# Patient Record
Sex: Female | Born: 1960
Health system: Southern US, Community
[De-identification: ages and names within clinical notes are randomized; demographics above are authoritative.]

## PROBLEM LIST (undated history)

## (undated) DIAGNOSIS — I10 Essential (primary) hypertension: Secondary | ICD-10-CM

## (undated) DIAGNOSIS — E785 Hyperlipidemia, unspecified: Secondary | ICD-10-CM

## (undated) DIAGNOSIS — I499 Cardiac arrhythmia, unspecified: Secondary | ICD-10-CM

## (undated) DIAGNOSIS — M1990 Unspecified osteoarthritis, unspecified site: Secondary | ICD-10-CM

## (undated) DIAGNOSIS — J45909 Unspecified asthma, uncomplicated: Secondary | ICD-10-CM

## (undated) DIAGNOSIS — C801 Malignant (primary) neoplasm, unspecified: Secondary | ICD-10-CM

## (undated) DIAGNOSIS — E119 Type 2 diabetes mellitus without complications: Secondary | ICD-10-CM

## (undated) DIAGNOSIS — Z9289 Personal history of other medical treatment: Secondary | ICD-10-CM

## (undated) HISTORY — DX: Personal history of other medical treatment: Z92.89

## (undated) HISTORY — PX: OTHER SURGICAL HISTORY: SHX169

## (undated) HISTORY — DX: Hyperlipidemia, unspecified: E78.5

## (undated) HISTORY — DX: Type 2 diabetes mellitus without complications: E11.9

## (undated) HISTORY — DX: Essential (primary) hypertension: I10

## (undated) HISTORY — DX: Unspecified asthma, uncomplicated: J45.909

---
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
## 2001-01-01 ENCOUNTER — Ambulatory Visit (HOSPITAL_COMMUNITY): Admission: RE | Admit: 2001-01-01 | Discharge: 2001-01-01 | Payer: Self-pay | Admitting: Family Medicine

## 2001-01-01 ENCOUNTER — Encounter: Payer: Self-pay | Admitting: Family Medicine

## 2001-01-12 ENCOUNTER — Other Ambulatory Visit: Admission: RE | Admit: 2001-01-12 | Discharge: 2001-01-12 | Payer: Self-pay | Admitting: *Deleted

## 2001-03-11 ENCOUNTER — Ambulatory Visit (HOSPITAL_COMMUNITY): Admission: RE | Admit: 2001-03-11 | Discharge: 2001-03-11 | Payer: Self-pay | Admitting: *Deleted

## 2001-03-11 ENCOUNTER — Encounter: Payer: Self-pay | Admitting: *Deleted

## 2001-04-22 ENCOUNTER — Encounter: Payer: Self-pay | Admitting: *Deleted

## 2001-04-22 ENCOUNTER — Ambulatory Visit (HOSPITAL_COMMUNITY): Admission: RE | Admit: 2001-04-22 | Discharge: 2001-04-22 | Payer: Self-pay | Admitting: *Deleted

## 2001-05-06 ENCOUNTER — Ambulatory Visit (HOSPITAL_COMMUNITY): Admission: RE | Admit: 2001-05-06 | Discharge: 2001-05-06 | Payer: Self-pay | Admitting: *Deleted

## 2001-05-10 ENCOUNTER — Ambulatory Visit (HOSPITAL_COMMUNITY): Admission: RE | Admit: 2001-05-10 | Discharge: 2001-05-10 | Payer: Self-pay | Admitting: *Deleted

## 2001-05-13 ENCOUNTER — Ambulatory Visit (HOSPITAL_COMMUNITY): Admission: AD | Admit: 2001-05-13 | Discharge: 2001-05-13 | Payer: Self-pay | Admitting: *Deleted

## 2001-05-17 ENCOUNTER — Ambulatory Visit (HOSPITAL_COMMUNITY): Admission: RE | Admit: 2001-05-17 | Discharge: 2001-05-17 | Payer: Self-pay | Admitting: *Deleted

## 2001-05-20 ENCOUNTER — Ambulatory Visit (HOSPITAL_COMMUNITY): Admission: AD | Admit: 2001-05-20 | Discharge: 2001-05-20 | Payer: Self-pay | Admitting: *Deleted

## 2001-05-24 ENCOUNTER — Ambulatory Visit (HOSPITAL_COMMUNITY): Admission: RE | Admit: 2001-05-24 | Discharge: 2001-05-24 | Payer: Self-pay | Admitting: *Deleted

## 2001-05-27 ENCOUNTER — Ambulatory Visit (HOSPITAL_COMMUNITY): Admission: RE | Admit: 2001-05-27 | Discharge: 2001-05-27 | Payer: Self-pay | Admitting: *Deleted

## 2001-05-31 ENCOUNTER — Ambulatory Visit (HOSPITAL_COMMUNITY): Admission: AD | Admit: 2001-05-31 | Discharge: 2001-05-31 | Payer: Self-pay | Admitting: *Deleted

## 2001-06-03 ENCOUNTER — Ambulatory Visit (HOSPITAL_COMMUNITY): Admission: RE | Admit: 2001-06-03 | Discharge: 2001-06-03 | Payer: Self-pay | Admitting: *Deleted

## 2001-06-03 ENCOUNTER — Encounter: Payer: Self-pay | Admitting: *Deleted

## 2001-06-04 ENCOUNTER — Inpatient Hospital Stay (HOSPITAL_COMMUNITY): Admission: RE | Admit: 2001-06-04 | Discharge: 2001-06-05 | Payer: Self-pay | Admitting: *Deleted

## 2001-07-09 ENCOUNTER — Ambulatory Visit (HOSPITAL_COMMUNITY): Admission: RE | Admit: 2001-07-09 | Discharge: 2001-07-09 | Payer: Self-pay | Admitting: *Deleted

## 2002-01-05 ENCOUNTER — Ambulatory Visit (HOSPITAL_COMMUNITY): Admission: RE | Admit: 2002-01-05 | Discharge: 2002-01-05 | Payer: Self-pay | Admitting: Family Medicine

## 2002-01-05 ENCOUNTER — Encounter: Payer: Self-pay | Admitting: Family Medicine

## 2003-01-17 ENCOUNTER — Encounter: Payer: Self-pay | Admitting: *Deleted

## 2003-01-17 ENCOUNTER — Ambulatory Visit (HOSPITAL_COMMUNITY): Admission: RE | Admit: 2003-01-17 | Discharge: 2003-01-17 | Payer: Self-pay | Admitting: *Deleted

## 2003-04-15 ENCOUNTER — Encounter: Payer: Self-pay | Admitting: Emergency Medicine

## 2003-04-15 ENCOUNTER — Emergency Department (HOSPITAL_COMMUNITY): Admission: EM | Admit: 2003-04-15 | Discharge: 2003-04-15 | Payer: Self-pay | Admitting: Emergency Medicine

## 2003-04-29 ENCOUNTER — Emergency Department (HOSPITAL_COMMUNITY): Admission: EM | Admit: 2003-04-29 | Discharge: 2003-04-29 | Payer: Self-pay | Admitting: Internal Medicine

## 2003-05-02 ENCOUNTER — Emergency Department (HOSPITAL_COMMUNITY): Admission: EM | Admit: 2003-05-02 | Discharge: 2003-05-02 | Payer: Self-pay | Admitting: Emergency Medicine

## 2003-05-12 ENCOUNTER — Emergency Department (HOSPITAL_COMMUNITY): Admission: EM | Admit: 2003-05-12 | Discharge: 2003-05-13 | Payer: Self-pay | Admitting: *Deleted

## 2003-05-15 ENCOUNTER — Emergency Department (HOSPITAL_COMMUNITY): Admission: EM | Admit: 2003-05-15 | Discharge: 2003-05-16 | Payer: Self-pay | Admitting: Emergency Medicine

## 2003-05-16 ENCOUNTER — Ambulatory Visit (HOSPITAL_COMMUNITY): Admission: RE | Admit: 2003-05-16 | Discharge: 2003-05-16 | Payer: Self-pay | Admitting: Family Medicine

## 2003-07-01 HISTORY — PX: CARDIAC CATHETERIZATION: SHX172

## 2003-07-08 ENCOUNTER — Emergency Department (HOSPITAL_COMMUNITY): Admission: EM | Admit: 2003-07-08 | Discharge: 2003-07-08 | Payer: Self-pay | Admitting: Emergency Medicine

## 2003-07-15 ENCOUNTER — Emergency Department (HOSPITAL_COMMUNITY): Admission: EM | Admit: 2003-07-15 | Discharge: 2003-07-16 | Payer: Self-pay | Admitting: Internal Medicine

## 2003-07-21 ENCOUNTER — Inpatient Hospital Stay (HOSPITAL_COMMUNITY): Admission: EM | Admit: 2003-07-21 | Discharge: 2003-07-24 | Payer: Self-pay | Admitting: *Deleted

## 2003-07-21 ENCOUNTER — Encounter: Payer: Self-pay | Admitting: Emergency Medicine

## 2004-02-06 ENCOUNTER — Ambulatory Visit (HOSPITAL_COMMUNITY): Admission: RE | Admit: 2004-02-06 | Discharge: 2004-02-06 | Payer: Self-pay | Admitting: Family Medicine

## 2004-02-21 ENCOUNTER — Ambulatory Visit (HOSPITAL_COMMUNITY): Admission: RE | Admit: 2004-02-21 | Discharge: 2004-02-21 | Payer: Self-pay | Admitting: Family Medicine

## 2005-02-10 ENCOUNTER — Ambulatory Visit (HOSPITAL_COMMUNITY): Admission: RE | Admit: 2005-02-10 | Discharge: 2005-02-10 | Payer: Self-pay | Admitting: Family Medicine

## 2006-02-12 ENCOUNTER — Ambulatory Visit (HOSPITAL_COMMUNITY): Admission: RE | Admit: 2006-02-12 | Discharge: 2006-02-12 | Payer: Self-pay | Admitting: Family Medicine

## 2007-03-05 ENCOUNTER — Ambulatory Visit (HOSPITAL_COMMUNITY): Admission: RE | Admit: 2007-03-05 | Discharge: 2007-03-05 | Payer: Self-pay | Admitting: Family Medicine

## 2007-03-31 HISTORY — PX: TRANSTHORACIC ECHOCARDIOGRAM: SHX275

## 2007-06-21 ENCOUNTER — Other Ambulatory Visit: Admission: RE | Admit: 2007-06-21 | Discharge: 2007-06-21 | Payer: Self-pay | Admitting: Obstetrics and Gynecology

## 2008-03-13 ENCOUNTER — Ambulatory Visit (HOSPITAL_COMMUNITY): Admission: RE | Admit: 2008-03-13 | Discharge: 2008-03-13 | Payer: Self-pay | Admitting: Family Medicine

## 2008-06-21 ENCOUNTER — Other Ambulatory Visit: Admission: RE | Admit: 2008-06-21 | Discharge: 2008-06-21 | Payer: Self-pay | Admitting: Obstetrics and Gynecology

## 2009-02-28 DIAGNOSIS — Z9289 Personal history of other medical treatment: Secondary | ICD-10-CM

## 2009-02-28 HISTORY — DX: Personal history of other medical treatment: Z92.89

## 2009-03-23 ENCOUNTER — Ambulatory Visit (HOSPITAL_COMMUNITY): Admission: RE | Admit: 2009-03-23 | Discharge: 2009-03-23 | Payer: Self-pay | Admitting: Family Medicine

## 2009-08-08 ENCOUNTER — Other Ambulatory Visit: Admission: RE | Admit: 2009-08-08 | Discharge: 2009-08-08 | Payer: Self-pay | Admitting: Obstetrics and Gynecology

## 2009-09-20 ENCOUNTER — Encounter: Payer: Self-pay | Admitting: Internal Medicine

## 2009-09-24 ENCOUNTER — Encounter: Payer: Self-pay | Admitting: Internal Medicine

## 2009-10-08 ENCOUNTER — Ambulatory Visit (HOSPITAL_COMMUNITY): Admission: RE | Admit: 2009-10-08 | Discharge: 2009-10-08 | Payer: Self-pay | Admitting: Internal Medicine

## 2009-10-08 ENCOUNTER — Ambulatory Visit: Payer: Self-pay | Admitting: Internal Medicine

## 2009-10-09 ENCOUNTER — Encounter: Payer: Self-pay | Admitting: Internal Medicine

## 2009-12-06 ENCOUNTER — Ambulatory Visit (HOSPITAL_COMMUNITY)
Admission: RE | Admit: 2009-12-06 | Discharge: 2009-12-06 | Payer: Self-pay | Source: Home / Self Care | Admitting: Family Medicine

## 2010-03-28 ENCOUNTER — Ambulatory Visit (HOSPITAL_COMMUNITY): Admission: RE | Admit: 2010-03-28 | Discharge: 2010-03-28 | Payer: Self-pay | Admitting: Family Medicine

## 2010-07-30 NOTE — Letter (Signed)
Summary: Internal Other Tracy Knight  Internal Other Tracy Knight   Imported By: Cloria Spring LPN 32/95/1884 16:60:63  _____________________________________________________________________  External Attachment:    Type:   Image     Comment:   External Document

## 2010-07-30 NOTE — Letter (Signed)
Summary: Patient Notice, Colon Biopsy Results  [facility]  [address]   [place], [place] [address]   Phone: [phone]  Fax: [phone]       October 09, 2009   Tracy Knight [address] [place], [place]  [address] [birth year]    Dear [name],  I am pleased to inform you that the biopsies taken during your recent colonoscopy did not show any evidence of cancer upon pathologic examination.  Additional information/recommendations:  You should have a repeat colonoscopy examination  in 7 years.  Please call us if you are having persistent problems or have questions about your condition that have not been fully answered at this time.  Sincerely,    R. Roetta Sessions MD, FACP [facility] Ph: [phone]    Fax: [phone]   Appended Document: Patient Notice, Colon Biopsy Results letter mailed to pt  Appended Document: Patient Notice, Colon Biopsy Results Reminder appt for 66yr TCS- cdg

## 2010-07-30 NOTE — Letter (Signed)
Summary: Internal Other Domingo Dimes  Internal Other Domingo Dimes   Imported By: Cloria Spring LPN 08/65/7846 96:29:52  _____________________________________________________________________  External Attachment:    Type:   Image     Comment:   External Document

## 2010-09-18 LAB — GLUCOSE, CAPILLARY: Glucose-Capillary: 121 mg/dL — ABNORMAL HIGH (ref 70–99)

## 2010-11-15 NOTE — Cardiovascular Report (Signed)
Tracy Knight, Tracy Knight                            ACCOUNT NO.:  [id number]   MEDICAL RECORD NO.:  [id number]                   PATIENT TYPE:  INP   LOCATION:  3729                                 FACILITY:  [facility]   PHYSICIAN:  Cristy Hilts. Jacinto Halim, M.D.                  DATE OF BIRTH:  10-18-1960   DATE OF PROCEDURE:  07/24/2003  DATE OF DISCHARGE:  07/24/2003                              CARDIAC CATHETERIZATION   REFERRING PHYSICIAN:  Kirk Ruths, M.D.   PROCEDURE PERFORMED:  1. Left ventriculography.  2. Selective right and left coronary arteriography.  3. Ascending aortogram.  4. Abdominal aortogram.  5. Right femoral angiography with closure of right femoral artery access     with Angio-Seal.   INDICATIONS:  Tracy Knight is a 50 year old African-American female with a  history of hypertension who has been complaining of chest pain.  She was  evaluated in the outpatient setting with multiple recurrent episodes of  chest pain.  She had undergone Cardiolite stress test which had revealed  probable anterior wall ischemia.  She was then recently admitted to the  hospital with chest pain again.  She was ruled out for myocardial  infarction.  She was brought to the cardiac catheterization lab to evaluate  her coronary anatomy.   Ascending aortogram was performed to evaluate for suspected aortic  dissection, and also abdominal aortogram was performed to evaluate for  abdominal atherosclerosis.   HEMODYNAMIC DATA:  The left ventricular pressure 112/17 with end-diastolic  pressure of 23 mmHg.  The aortic pressure was 112/72 with a mean of 91 mmHg.  There was no pressure gradient across the aortic valve.   ANGIOGRAPHIC DATA:  LEFT VENTRICLE:  Left ventricular systolic function was  normal.  The ejection fraction was estimated at 60%.  There was no  significant mitral regurgitation.   RIGHT CORONARY ARTERY:  Right coronary artery is a large caliber vessel.  It  is normal.   LEFT  MAIN CORONARY ARTERY:  The left main coronary artery is a large caliber  vessel.  It is normal.   CIRCUMFLEX CORONARY ARTERY:  The circumflex coronary artery is a very large  vessel.  In the proximal segment, it gives origin to a very large obtuse  marginal 1.  The obtuse marginal 1 is larger than circumflex, and the  circumflex itself is a small vessel after the origin of obtuse marginal 1.  The obtuse marginal 1 is normal.   LEFT ANTERIOR DESCENDING ARTERY:  Left anterior descending artery is a large  caliber vessel.  It gives origin to a moderate to large-sized diagonal 1.  It also gives origin to a large-sized diagonal 2.  It is normal.  The mid  segment had mild intramyocardial bridging.  However, there was no  significant compression during systole.   ASCENDING AORTOGRAM:  The ascending aortogram revealed the presence of 3  aortic valve cusps.  There was no evidence of aortic regurgitation.  There  was no evidence of aortic dissection.   ABDOMINAL AORTOGRAM:  The abdominal aortogram revealed the presence of 1  renal artery on the right and 2 renal arteries on the left side.  They were  normal.  There was no evidence of abdominal aortic aneurysm.   IMPRESSIONS:  1. Normal left ventricular systolic function, ejection fraction 60%.  No     significant valvular abnormality.  2. Mildly elevated left ventricular end-diastolic pressure secondary to     hypertension with hypertensive heart disease.  3. Normal coronary arteries.  4. No evidence of abdominal aortic aneurysm or ascending aortic aneurysm or     ascending aortic dissection.   RECOMMENDATION:  Evaluation for noncardiac cause of chest pain is indicated.   TECHNIQUE OF PROCEDURE:  Under the usual sterile precautions using a 6-  French right femoral artery access, a 6-French multipurpose B2 catheter was  advanced into the ascending aorta with 0.035 mm guidewire.  The catheter was  gently advanced into the left ventricle and  left ventricular pressures were  monitored.  Hand contrast injection was performed in both the LAO and RAO  projections.  The catheter was flushed with saline and pulled back into the  ascending aorta and pressure gradient across the aortic valve was monitored.  The right coronary artery was selectively engaged and angiography was  performed.  In a similar fashion, the left main coronary artery was  selectively engaged and angiography was performed.  Then the catheter was  pulled back into the root of the aorta and the ascending aortogram was  performed.  Then the catheter was pulled back into the abdominal aorta and  abdominal aortogram was performed.  Then the catheter was pulled out of the  body in the usual fashion.  Right pulmonary angiography was performed  through the lateral access sheath, and the access was closed with Angio-Seal  with excellent hemostasis obtained.  The patient was transferred to the  recovery area in stable condition.                                               Cristy Hilts. Jacinto Halim, M.D.    Pilar Plate  D:  07/24/2003  T:  07/25/2003  Job:  [id number]   cc:   Kirk Ruths, M.D.  [address]  [place]  [place] [address]  Fax: [phone]

## 2010-11-15 NOTE — Discharge Summary (Signed)
[facility]  Patient:    NEETI, KNUDTSON Visit Number: [id number] MRN: [id number]          Service Type: OBS Location: 4A A425 01 Attending Physician:  Jeri Cos. Dictated by:   Langley Gauss, M.D. Admit Date:  06/03/2001 Discharge Date: 06/05/2001                             Discharge Summary  DISCHARGE DIAGNOSES: 1. Intrauterine pregnancy at 37 weeks presenting in labor. 2. Chronic benign essential hypertension preceding pregnancy.  PROCEDURE:  Spontaneous assisted vaginal delivery of 7 pound 14.9 ounce female infant delivered over an intact perineum.  Delivery performed by Dr. Langley Gauss.  LABORATORY DATA AND X-RAY FINDINGS:  Admission hemoglobin and hematocrit of 9.9 and 28.0.  Postpartum day #1, hemoglobin and hematocrit 8.9 and 25.4.  FOLLOWUP:  The patient is to follow up in the office in three weeks time. She is interested in discussing permanent sterilization at that time.  HOSPITAL COURSE:  Please see previous dictations.  The patient delivered on June 04, 2001, in an uncomplicated manner.  Postpartum, the patient did very well.  She was restarted on her antihypertensive medications.  Blood pressure remained stable.  She was also discharged home on postpartum day #1 accompanied with her infant. Dictated by:   Langley Gauss, M.D. Attending Physician:  Jeri Cos. DD:  06/07/01 TD:  06/07/01 Job: [id number]

## 2010-11-15 NOTE — Discharge Summary (Signed)
Tracy Knight, Tracy Knight                            ACCOUNT NO.:  [id number]   MEDICAL RECORD NO.:  [id number]                   PATIENT TYPE:  INP   LOCATION:  3729                                 FACILITY:  [facility]   PHYSICIAN:  Dani Gobble, MD                    DATE OF BIRTH:  [birth year]   DATE OF ADMISSION:  07/21/2003  DATE OF DISCHARGE:  07/24/2003                                 DISCHARGE SUMMARY   ADMISSION DIAGNOSES:  1. Chest pain, nitroglycerin responsive.  2. Asthma.  3. Hypertension.  4. Positive CRF.   DISCHARGE DIAGNOSES:  1. Chest pain, nitroglycerin responsive.  2. Asthma.  3. Hypertension.  4. Positive CRF.   PROCEDURES:  Cardiac catheterization July 24, 2003.   REFERRING PHYSICIAN:  Dr. __________ in [place].   BRIEF HISTORY:  The patient is a 50 year old African-American female with a  past medical history of hypertension, asthma, evaluated in the office  recently for chest pain.  She had a Cardiolite which showed mild anterior  ischemia.  She was scheduled for outpatient catheterization today.  She  presented with chest pain.  She had no radiation, no shortness of breath, no  nausea, no vomiting.  She did have some diaphoresis.  She had taken a single  medicine, Singulair, prior to this chest pain.  She took 1 nitroglycerin  sublingually and obtained relief.  The chest pain recurred later, and then  she says her heart was racing.  She was sent to the [facility] Emergency  Room.  Chest pain resolved with sublingual nitroglycerin x3, and she was  transferred to [facility] for further treatment and evaluation.   PAST MEDICAL HISTORY:  Positive for hypertension.  No history of diabetes.  No history of tobacco use.  Positive history of lipids.  Positive family  history.   ALLERGIES:  None known.   CURRENT MEDICATIONS:  1. Diclofenac, or Voltaren, 75 mg b.i.d.  2. Spiriva MDI daily.  3. Maxzide 37.5/25 1 daily.  4. Singulair 10 mg daily.  5.  Combivent b.i.d.  6. Cardizem 360 mg daily.  7. Potassium chloride 10 mEq daily.  8. Prevacid 30 mg daily.  9. Aspirin 81 mg daily.   For further history and physical, please see the note.   HOSPITAL COURSE:  The patient was admitted.  She was placed on IV heparin  and nitroglycerin.  She had no further chest discomfort.  CKs and troponins  were negative.  TSH was 0.502.  A C-reactive protein was positive at 3.7.  The patient had no further chest pain.  She was maintained on heparin and  then taken to the catheterization laboratory on the a.m. of July 24, 2003.  The dictated catheterization is not back but showed normal aortic  root with normal coronaries, and there was no evidence to suggest any  significant disease.  She was  closed with an Angio-Seal, and it was Dr.  Verl Dicker opinion the patient could go home later that day.  There was  stabilized.  Her groin site looked good, and we anticipate discharge later  today when she has completed her bed rest and ambulates without difficulty.   DISCHARGE MEDICATIONS:  1. Voltaren 75 mg b.i.d.  2. Patient to resume her Combivent as before.  3. Maxzide 37.5/25 1 daily.  4. Singulair 10 mg daily.  5. Combivent inhaler 2 puffs b.i.d.  6. Cardizem 360 mg daily.  7. Potassium chloride 10 mEq daily.  8. Aspirin 81 mg daily.  9. Protonix 40 mg daily.   LABORATORIES:  Hemoglobin A1C was 5.9.  TSH was 0.502.  Lipid profile showed  a cholesterol of 168, triglycerides of 80, HDL of 61, and LDL of 91.  Other  laboratories:  CBC on July 24, 2003, prior to procedure showed white  count of 6.1, hemoglobin of 10.2, hematocrit of 30.6, platelet count  273,000.  Protime and PTT were normal.  As noted above, troponins and CKs  were also negative.   PLAN:  The patient will be discharged home on her preadmission medicines.  Her Prevacid is being changed to Protonix 40 mg daily.  She is to remove her  dressing in 48 hours.  Call for any swelling or  problems with the  catheterization site.  She will maintain a low-salt, low-fat diet.  Walk  daily.  No strenuous activity for 2 weeks.  She will return to see Dr.  Domingo Sep in the office in approximately 2-3 weeks.   CONDITION ON DISCHARGE:  Improved.      Eber Hong, P.A.                 Dani Gobble, MD    WDJ/MEDQ  D:  07/24/2003  T:  07/25/2003  Job:  [id number]   cc:   Kirk Ruths, M.D.  [address]  [place]  [place] [address]  Fax: [phone]

## 2010-11-15 NOTE — Op Note (Signed)
[facility]  Patient:    Tracy Knight, Tracy Knight Visit Number: [id number] MRN: [id number]          Service Type: DSU Location: DAY Attending Physician:  Jeri Cos Proc. Date: 07/09/01 Admit Date:  07/09/2001 Discharge Date: 07/09/2001                             Operative Report  DIAGNOSIS:  Desires permanent sterilization.  PROCEDURE:  Laparoscopic tubal ligation utilizing bipolar cautery.  SURGEON:  Langley Gauss, M.D.  FINDINGS AT TIME OF SURGERY:  Include two small anterior fundal fibroids, as well as a grapefruit-sized left subserosal fundal fibroid.  PERTINENT LABORATORY STUDIES:  HCG is negative.  Hemoglobin 11.8, hematocrit 35.0, white count is 6.5.  HOSPITAL COURSE:  The patient admitted through ambulatory unit and taken to the OR where utilizing general anesthesia a laparoscopic tubal ligation was performed without complications.  DESCRIPTION OF PROCEDURE:  The desire for permanent sterilization was discussed in the immediate preoperative holding area.  The patient was then taken to the operating room where vital signs were stable.  The patient underwent an uncomplicated induction of general endotracheal anesthesia, after which time she was placed in the low lithotomy position, prepped and draped in the usual sterile manner.  A red rubber catheter was used to drain 100 cc of clear-yellow urine from the bladder.  A speculum was then placed into the vaginal vault.  The anterior lip of the cervix was grasped with a single-toothed tenaculum, and a Hulka intrauterine manipulator was then passed through the endocervical os and used to grasp the cervix at 12 oclock.  The speculum was then removed.  I then stood at the patients left side and a 1-cm vertical incision is made just below the umbilicus, a 1-cm transverse incision is made just suprapubically.  The abdomen wall is then elevated, the Veress needle is then passed perpendicular to the  fascial plane to atraumatically enter the  peritoneal cavity.  A drop test confirms a proper intraperitoneal placement.  A pneumoperitoneum is then created by insufflating 3.5 liters of CO2 gas at a filling pressure of less than 15 mmHg.  After assurance of adequate pneumoperitoneum, the Veress needle is removed.  The abdominal wall was again elevated, the 10-mm trocar and sleeve were inserted through the subumbilical incision, angling towards the hollow of the sacrum; this likewise is done atraumatically.  The scope is then inserted which confirms a proper intraperitoneal placement with no apparent bowel or vascular injury.  A 5-mm trocar is then inserted suprapubically under direct visualization in the midline; this is likewise done atraumatically which allows passage of the Klepinger bipolar cauterization forceps through this suprapubic site.  With the patient now in Trendelenburg position and the uterus elevated, I was able to adequately see the pelvic cavity as described previously.  Pertinent tubes and ovaries noted to be normal in appearance and fibroids as discussed previously.  The right fallopian tube was identified, verification of tubal structures made by tracing it out to its fimbriated end.  The triple cauterization technique described by Klepinger is employed at this time with three bipolar cauterizations performed on the tube in direct continuity with the previous.  Each of these were continued until the LED cauterizing current plateaued down at 2-3 LED lights.  This resulted in extensive tubular destruction with destruction of 3 cm of fallopian tube.  The right tube was performed first.  The left tube  is then identified, verification of tubal anatomy is made by tracing it out to its fimbriated end.  The left tube is then handled in an exact manner as the right, with three cauterizations being performed, the most proximal being 1 cm from the tubouterine junction, all three  cauterizations in direct continuity with the previous with resultant destruction of 3 cm of fallopian tube.  After assurance of adequate tubal destruction, the pelvic content is again visualized and there is noted to be no active bleeding or evidence of any intra-abdominal or intrapelvic injury. Thus, the instruments are removed and the gas is allowed to escape.  Our two incision sites are closed utilizing 0 Vicryl in a deep interrupted fashion through and through the fascia, and the skin reapproximates easily with the placement of Band-Aids.  The patient tolerated the procedure very well.  She was extubated of general anesthesia and taken to the recovery room in stable condition.  I did have a chance to discuss the operative findings with the patient in the postoperative recovery area to include the finding of the fibroids.  Notably, after delivery she had had a very large uterus which failed to fall below the umbilicus by the time of discharge and at that point in time it became apparent that she did have these fibroids present.  At this point in time we will have to evaluate and see how her menstrual periods do after the performance of the tubal ligation.  She is advised that symptoms of the fibroids would be heavy menses or cramping or clotting with menses.  The patient was discharged home on the day of service.  DISCHARGE MEDICATIONS:  Tylox.  DISPOSITION:  Standardized instructions given and will follow up in the office in about one weeks time. Attending Physician:  Jeri Cos. DD:  07/12/01 TD:  07/12/01 Job: [id number]

## 2010-11-15 NOTE — H&P (Signed)
[facility]  Patient:    KEEGHAN, MCINTIRE Visit Number: [id number] MRN: [id number]          Service Type: OBS Location: 4A A425 01 Attending Physician:  Jeri Cos. Dictated by:   Langley Gauss, M.D. Admit Date:  06/03/2001 Discharge Date: 06/05/2001                           History and Physical  BRIEF HISTORY:   This multiparous patient presents to labor and delivery in obvious early stage of labor.  Patients prenatal course is complicated by findings of chronic benign essential hypertension preceding the pregnancy. Adequately medicated during the pregnancy she is noted to be having twice weekly nonstress test which ______ and nonreactive.  Prenatal course as stated previously.  Abnormal 1 hour gtt. normal 3 hour gtt.  PAST MEDICAL HISTORY:  One prior vaginal delivery.  MEDICATIONS:  Prenatal vitamins, antihypertensive medications.  PHYSICAL EXAMINATION:  GENERAL:  Black female.  VITAL SIGNS:  Blood pressure 149/90, pulse 80, respiratory rate is 20.  HEENT:  Negative.  No adenopathy.  NECK:  Supple.  Thyroid is nonpalpable.  LUNGS:  Clear.  CARDIOVASCULAR:  Regular rate and rhythm.  ABDOMEN:  Soft and nontender.  Fundal height 38 cm., vertex presentation. Obvious rupture of membranes noted.  Initial examination by myself, cervix 3 cm dilated, completely effaced, minus 1 station.  ASSESSMENT:  A 37 plus week intrauterine pregnancy, chronic hypertension. Patient presents in labor. Dictated by:   Langley Gauss, M.D. Attending Physician:  Jeri Cos. DD:  06/09/01 TD:  06/09/01 Job: [id number]

## 2011-04-15 ENCOUNTER — Other Ambulatory Visit (HOSPITAL_COMMUNITY): Payer: Self-pay | Admitting: Family Medicine

## 2011-04-15 DIAGNOSIS — Z139 Encounter for screening, unspecified: Secondary | ICD-10-CM

## 2011-04-17 ENCOUNTER — Ambulatory Visit (HOSPITAL_COMMUNITY)
Admission: RE | Admit: 2011-04-17 | Discharge: 2011-04-17 | Disposition: A | Discharge status: Home / Self Care | Payer: 59 | Source: Ambulatory Visit | Attending: Family Medicine | Admitting: Family Medicine

## 2011-04-17 DIAGNOSIS — Z1231 Encounter for screening mammogram for malignant neoplasm of breast: Secondary | ICD-10-CM | POA: Insufficient documentation

## 2011-04-17 DIAGNOSIS — Z139 Encounter for screening, unspecified: Secondary | ICD-10-CM

## 2011-10-17 ENCOUNTER — Other Ambulatory Visit: Payer: Self-pay | Admitting: Obstetrics & Gynecology

## 2011-10-17 ENCOUNTER — Other Ambulatory Visit (HOSPITAL_COMMUNITY)
Admission: RE | Admit: 2011-10-17 | Discharge: 2011-10-17 | Disposition: A | Discharge status: Home / Self Care | Payer: 59 | Source: Ambulatory Visit | Attending: Obstetrics & Gynecology | Admitting: Obstetrics & Gynecology

## 2011-10-17 DIAGNOSIS — Z01419 Encounter for gynecological examination (general) (routine) without abnormal findings: Secondary | ICD-10-CM | POA: Insufficient documentation

## 2012-04-06 ENCOUNTER — Other Ambulatory Visit (HOSPITAL_COMMUNITY): Payer: Self-pay | Admitting: Family Medicine

## 2012-04-06 DIAGNOSIS — Z139 Encounter for screening, unspecified: Secondary | ICD-10-CM

## 2012-04-19 ENCOUNTER — Ambulatory Visit (HOSPITAL_COMMUNITY)
Admission: RE | Admit: 2012-04-19 | Discharge: 2012-04-19 | Disposition: A | Discharge status: Home / Self Care | Payer: 59 | Source: Ambulatory Visit | Attending: Family Medicine | Admitting: Family Medicine

## 2012-04-19 DIAGNOSIS — Z1231 Encounter for screening mammogram for malignant neoplasm of breast: Secondary | ICD-10-CM | POA: Insufficient documentation

## 2012-04-19 DIAGNOSIS — Z139 Encounter for screening, unspecified: Secondary | ICD-10-CM

## 2013-02-14 ENCOUNTER — Other Ambulatory Visit (HOSPITAL_COMMUNITY)
Admission: RE | Admit: 2013-02-14 | Discharge: 2013-02-14 | Disposition: A | Discharge status: Home / Self Care | Payer: 59 | Source: Ambulatory Visit | Attending: Obstetrics & Gynecology | Admitting: Obstetrics & Gynecology

## 2013-02-14 ENCOUNTER — Encounter: Payer: Self-pay | Admitting: Obstetrics & Gynecology

## 2013-02-14 ENCOUNTER — Ambulatory Visit (INDEPENDENT_AMBULATORY_CARE_PROVIDER_SITE_OTHER): Payer: 59 | Admitting: Obstetrics & Gynecology

## 2013-02-14 VITALS — BP 140/80 | Ht 64.0 in | Wt 206.0 lb

## 2013-02-14 DIAGNOSIS — Z1212 Encounter for screening for malignant neoplasm of rectum: Secondary | ICD-10-CM

## 2013-02-14 DIAGNOSIS — Z01419 Encounter for gynecological examination (general) (routine) without abnormal findings: Secondary | ICD-10-CM | POA: Insufficient documentation

## 2013-02-14 DIAGNOSIS — I10 Essential (primary) hypertension: Secondary | ICD-10-CM

## 2013-02-14 DIAGNOSIS — Z1151 Encounter for screening for human papillomavirus (HPV): Secondary | ICD-10-CM | POA: Insufficient documentation

## 2013-02-14 DIAGNOSIS — E119 Type 2 diabetes mellitus without complications: Secondary | ICD-10-CM

## 2013-02-14 NOTE — Progress Notes (Signed)
Patient ID: Tracy Knight, female   DOB: 09-14-1960, 52 y.o.   MRN: [id number] Subjective:     Tracy Knight is a 52 y.o. female here for a routine exam.  No LMP recorded. Patient is postmenopausal. No obstetric history on file. Current complaints: none.  Personal health questionnaire reviewed: no.   Gynecologic History No LMP recorded. Patient is postmenopausal. Contraception: post menopausal status Last Pap: 2013. Results were: normal Last mammogram: 2013. Results were: normal  Obstetric History OB History  No data available     The following portions of the patient's history were reviewed and updated as appropriate: allergies, current medications, past family history, past medical history, past social history, past surgical history and problem list.  Review of Systems  Review of Systems  Constitutional: Negative for fever, chills, weight loss, malaise/fatigue and diaphoresis.  HENT: Negative for hearing loss, ear pain, nosebleeds, congestion, sore throat, neck pain, tinnitus and ear discharge.   Eyes: Negative for blurred vision, double vision, photophobia, pain, discharge and redness.  Respiratory: Negative for cough, hemoptysis, sputum production, shortness of breath, wheezing and stridor.   Cardiovascular: Negative for chest pain, palpitations, orthopnea, claudication, leg swelling and PND.  Gastrointestinal: negative for abdominal pain. Negative for heartburn, nausea, vomiting, diarrhea, constipation, blood in stool and melena.  Genitourinary: Negative for dysuria, urgency, frequency, hematuria and flank pain.  Musculoskeletal: Negative for myalgias, back pain, joint pain and falls.  Skin: Negative for itching and rash.  Neurological: Negative for dizziness, tingling, tremors, sensory change, speech change, focal weakness, seizures, loss of consciousness, weakness and headaches.  Endo/Heme/Allergies: Negative for environmental allergies and polydipsia. Does not bruise/bleed easily.   Psychiatric/Behavioral: Negative for depression, suicidal ideas, hallucinations, memory loss and substance abuse. The patient is not nervous/anxious and does not have insomnia.        Objective:    Physical Exam  Vitals reviewed. Constitutional: She is oriented to person, place, and time. She appears well-developed and well-nourished.  HENT:  Head: Normocephalic and atraumatic.        Right Ear: External ear normal.  Left Ear: External ear normal.  Nose: Nose normal.  Mouth/Throat: Oropharynx is clear and moist.  Eyes: Conjunctivae and EOM are normal. Pupils are equal, round, and reactive to light. Right eye exhibits no discharge. Left eye exhibits no discharge. No scleral icterus.  Neck: Normal range of motion. Neck supple. No tracheal deviation present. No thyromegaly present.  Cardiovascular: Normal rate, regular rhythm, normal heart sounds and intact distal pulses.  Exam reveals no gallop and no friction rub.   No murmur heard. Respiratory: Effort normal and breath sounds normal. No respiratory distress. She has no wheezes. She has no rales. She exhibits no tenderness.  GI: Soft. Bowel sounds are normal. She exhibits no distension and no mass. There is no tenderness. There is no rebound and no guarding.  Genitourinary:       Vulva is normal without lesions Vagina is pink moist without discharge Cervix normal in appearance and pap is done Uterus is normal size shape and contour Adnexa is negative with normal sized ovaries   Musculoskeletal: Normal range of motion. She exhibits no edema and no tenderness.  Neurological: She is alert and oriented to person, place, and time. She has normal reflexes. She displays normal reflexes. No cranial nerve deficit. She exhibits normal muscle tone. Coordination normal.  Skin: Skin is warm and dry. No rash noted. No erythema. No pallor.  Psychiatric: She has a normal mood and affect. Her  behavior is normal. Judgment and thought content normal.        Assessment:    Healthy female exam.    Plan:    Mammogram ordered. Follow up in: 1 year.

## 2013-02-23 ENCOUNTER — Encounter: Payer: Self-pay | Admitting: *Deleted

## 2013-02-25 ENCOUNTER — Encounter: Payer: Self-pay | Admitting: *Deleted

## 2013-02-25 ENCOUNTER — Encounter: Payer: Self-pay | Admitting: Cardiovascular Disease

## 2013-03-01 ENCOUNTER — Ambulatory Visit (INDEPENDENT_AMBULATORY_CARE_PROVIDER_SITE_OTHER): Payer: 59 | Admitting: Cardiovascular Disease

## 2013-03-01 ENCOUNTER — Encounter: Payer: Self-pay | Admitting: Cardiovascular Disease

## 2013-03-01 VITALS — BP 152/94 | HR 80 | Resp 16 | Ht 64.5 in | Wt 203.9 lb

## 2013-03-01 DIAGNOSIS — Z79899 Other long term (current) drug therapy: Secondary | ICD-10-CM

## 2013-03-01 DIAGNOSIS — I10 Essential (primary) hypertension: Secondary | ICD-10-CM

## 2013-03-01 DIAGNOSIS — E119 Type 2 diabetes mellitus without complications: Secondary | ICD-10-CM

## 2013-03-01 DIAGNOSIS — E782 Mixed hyperlipidemia: Secondary | ICD-10-CM

## 2013-03-01 DIAGNOSIS — R002 Palpitations: Secondary | ICD-10-CM

## 2013-03-01 MED ORDER — LOSARTAN POTASSIUM 50 MG PO TABS: 50.0000 mg | ORAL_TABLET | Freq: Every day | ORAL | Status: DC

## 2013-03-01 NOTE — Patient Instructions (Addendum)
Your physician recommends that you schedule a follow-up appointment in: 1 year Your physician has recommended you make the following change in your medication: Start Losartan 50 mg once daily. Continue the other medications unchanged. Your physician recommends that you return for fasting lab work in: 1 month

## 2013-03-01 NOTE — Progress Notes (Signed)
Patient ID: Tracy Knight, female   DOB: 1960/10/31, 52 y.o.   MRN: [id number]       Reason for office visit Followup palpitations, hypertension, coronary risk factors  Is is notable has been doing quite well. Her palpitations rarely bother her. She continues to be physically active, exercising at the [facility] at least 3 daily x4 days a week. She feels well. Palpitations never bother her during exercise. She has very well compensated diabetes mellitus and treated mixed hyperlipidemia. Her blood pressure slightly high today but when she has checked it at home it is even higher usually in the 150s over 80s. This is surprising, especially since she has lost a remarkable amount of weight (50 pounds over the last year) and is now in the overweight rather than obese range.   No Known Allergies  Current Outpatient Prescriptions  Medication Sig Dispense Refill  . cholecalciferol (VITAMIN D) 1000 UNITS tablet Take 1,000 Units by mouth daily.      . Cyanocobalamin (VITAMIN B-12 PO) Take by mouth daily.      Marland Kitchen diltiazem (TIAZAC) 360 MG 24 hr capsule Take 360 mg by mouth daily.      . fish oil-omega-3 fatty acids 1000 MG capsule Take 1 g by mouth daily.      . GuaiFENesin (MUCINEX PO) Take by mouth as needed.      . IBUPROFEN PO Take by mouth as needed.      . Ipratropium-Albuterol (COMBIVENT IN) Inhale into the lungs as needed.      . metFORMIN (GLUMETZA) 500 MG (MOD) 24 hr tablet Take 500 mg by mouth daily with breakfast.      . montelukast (SINGULAIR) 10 MG tablet Take 10 mg by mouth every morning.      . simvastatin (ZOCOR) 20 MG tablet Take 20 mg by mouth at bedtime.      . triamterene-hydrochlorothiazide (DYAZIDE) 37.5-25 MG per capsule Take 1 capsule by mouth every morning.      Marland Kitchen losartan (COZAAR) 50 MG tablet Take 1 tablet (50 mg total) by mouth daily.  90 tablet  3   No current facility-administered medications for this visit.    Past Medical History  Diagnosis Date  . Diabetes mellitus  without complication   . Hypertension   . Asthma   . Dyslipidemia   . History of nuclear stress test 02/2009    exercise; normal pattern of perfusion; no significant ischemia demonstrated; low risk scan     Past Surgical History  Procedure Laterality Date  . Removal cyst on chin    . Cardiac catheterization  07/2003    no evidence of CAD  . Transthoracic echocardiogram  03/2007    EF=>55%; borderline LA enlargement; MV mildly thickened/borderline MVP/mild MR    Family History  Problem Relation Age of Onset  . Heart disease Mother   . Heart disease Father     History   Social History  . Marital Status: Married    Spouse Name: N/A    Number of Children: N/A  . Years of Education: N/A   Occupational History  . Not on file.   Social History Main Topics  . Smoking status: Never Smoker   . Smokeless tobacco: Never Used     Comment: never smoke.  Marland Kitchen Alcohol Use: No  . Drug Use: No  . Sexual Activity: Yes   Other Topics Concern  . Not on file   Social History Narrative  . No narrative on file  Review of systems: The patient specifically denies any chest pain at rest or with exertion, dyspnea at rest or with exertion, orthopnea, paroxysmal nocturnal dyspnea, syncope, focal neurological deficits, intermittent claudication, lower extremity edema, unexplained weight gain, cough, hemoptysis or wheezing.  The patient also denies abdominal pain, nausea, vomiting, dysphagia, diarrhea, constipation, polyuria, polydipsia, dysuria, hematuria, frequency, urgency, abnormal bleeding or bruising, fever, chills, unexpected weight changes, mood swings, change in skin or hair texture, change in voice quality, auditory or visual problems, allergic reactions or rashes, new musculoskeletal complaints other than usual "aches and pains".   PHYSICAL EXAM BP 152/94  Pulse 80  Resp 16  Ht 5' 4.5" (1.638 m)  Wt 203 lb 14.4 oz (92.488 kg)  BMI 34.47 kg/m2  General: Alert, oriented x3, no  distress Head: no evidence of trauma, PERRL, EOMI, no exophtalmos or lid lag, no myxedema, no xanthelasma; normal ears, nose and oropharynx Neck: normal jugular venous pulsations and no hepatojugular reflux; brisk carotid pulses without delay and no carotid bruits Chest: clear to auscultation, no signs of consolidation by percussion or palpation, normal fremitus, symmetrical and full respiratory excursions Cardiovascular: normal position and quality of the apical impulse, regular rhythm, normal first and second heart sounds, no murmurs, rubs or gallops Abdomen: no tenderness or distention, no masses by palpation, no abnormal pulsatility or arterial bruits, normal bowel sounds, no hepatosplenomegaly Extremities: no clubbing, cyanosis or edema; 2+ radial, ulnar and brachial pulses bilaterally; 2+ right femoral, posterior tibial and dorsalis pedis pulses; 2+ left femoral, posterior tibial and dorsalis pedis pulses; no subclavian or femoral bruits Neurological: grossly nonfocal   EKG: Sinus rhythm, generalized low voltage, otherwise normal  Lipid Panel  October 2013 triglycerides 56, total cholesterol 143, HDL 57, LDL 75, creatinine 0.88, fasting glucose 96, A1c 6.6%  ASSESSMENT AND PLAN Palpitations She appears to currently minimally symptomatic with this. She is on beta blocker therapy. Carvedilol should be taken twice a day. No other changes appear to be necessary  Type 2 diabetes mellitus Could control with hemoglobin A1c less than 7%  Hypertension Borderline control, and when she has checked her blood pressure recently has been even higher consistently in the 150 range. Since she has diabetes mellitus it is important to keep her pressure very well controlled. An angiotensin receptor blocker would be the optimal choice and I have given her prescription for losartan 50 mg once daily  Hyperlipidemia, mixed The most recent lipid profile I have is just over one year old and showed excellent  parameters. She is due to have repeat lab tests done with her primary care physician, Dr. Regino Schultze, next month.  She is congratulated on the outstanding job she has done with exercise and weight loss and is encouraged to keep it up Orders Placed This Encounter  Procedures  . Comp Met (CMET)  . Lipid Profile  . EKG 12-Lead   Meds ordered this encounter  Medications  . losartan (COZAAR) 50 MG tablet    Sig: Take 1 tablet (50 mg total) by mouth daily.    Dispense:  90 tablet    Refill:  3    Lannis Lichtenwalner  Thurmon Fair, MD, [facility] [phone] office [phone] pager

## 2013-03-06 ENCOUNTER — Encounter: Payer: Self-pay | Admitting: Cardiovascular Disease

## 2013-03-06 DIAGNOSIS — E782 Mixed hyperlipidemia: Secondary | ICD-10-CM | POA: Insufficient documentation

## 2013-03-06 DIAGNOSIS — R002 Palpitations: Secondary | ICD-10-CM | POA: Insufficient documentation

## 2013-03-06 NOTE — Assessment & Plan Note (Signed)
The most recent lipid profile I have is just over one year old and showed excellent parameters. She is due to have repeat lab tests done with her primary care physician, Dr. Regino Schultze, next month.

## 2013-03-06 NOTE — Assessment & Plan Note (Addendum)
Borderline control, and when she has checked her blood pressure recently has been even higher consistently in the 150 range. Since she has diabetes mellitus it is important to keep her pressure very well controlled. An angiotensin receptor blocker would be the optimal choice and I have given her prescription for losartan 50 mg once daily

## 2013-03-06 NOTE — Assessment & Plan Note (Addendum)
Could control with hemoglobin A1c less than 7%

## 2013-03-06 NOTE — Assessment & Plan Note (Signed)
She appears to currently minimally symptomatic with this. She is on beta blocker therapy. Carvedilol should be taken twice a day. No other changes appear to be necessary

## 2013-04-06 LAB — COMPREHENSIVE METABOLIC PANEL
ALT: 17 U/L (ref 0–35)
AST: 19 U/L (ref 0–37)
Alkaline Phosphatase: 61 U/L (ref 39–117)
Sodium: 140 mEq/L (ref 135–145)
Total Bilirubin: 0.8 mg/dL (ref 0.3–1.2)
Total Protein: 7.4 g/dL (ref 6.0–8.3)

## 2013-04-06 LAB — LIPID PANEL
LDL Cholesterol: 85 mg/dL (ref 0–99)
Triglycerides: 52 mg/dL (ref <150)
VLDL: 10 mg/dL (ref 0–40)

## 2013-04-19 ENCOUNTER — Other Ambulatory Visit (HOSPITAL_COMMUNITY): Payer: Self-pay | Admitting: Family Medicine

## 2013-04-19 DIAGNOSIS — Z139 Encounter for screening, unspecified: Secondary | ICD-10-CM

## 2013-04-22 ENCOUNTER — Ambulatory Visit (HOSPITAL_COMMUNITY)
Admission: RE | Admit: 2013-04-22 | Discharge: 2013-04-22 | Disposition: A | Discharge status: Home / Self Care | Payer: 59 | Source: Ambulatory Visit | Attending: Family Medicine | Admitting: Family Medicine

## 2013-04-22 DIAGNOSIS — Z139 Encounter for screening, unspecified: Secondary | ICD-10-CM

## 2013-04-22 DIAGNOSIS — Z1231 Encounter for screening mammogram for malignant neoplasm of breast: Secondary | ICD-10-CM | POA: Insufficient documentation

## 2014-02-23 ENCOUNTER — Encounter: Payer: Self-pay | Admitting: Obstetrics & Gynecology

## 2014-02-23 ENCOUNTER — Other Ambulatory Visit (HOSPITAL_COMMUNITY)
Admission: RE | Admit: 2014-02-23 | Discharge: 2014-02-23 | Disposition: A | Discharge status: Home / Self Care | Payer: BC Managed Care – PPO | Source: Ambulatory Visit | Attending: Obstetrics & Gynecology | Admitting: Obstetrics & Gynecology

## 2014-02-23 ENCOUNTER — Ambulatory Visit (INDEPENDENT_AMBULATORY_CARE_PROVIDER_SITE_OTHER): Payer: BC Managed Care – PPO | Admitting: Obstetrics & Gynecology

## 2014-02-23 VITALS — BP 120/80 | Ht 65.0 in | Wt 206.0 lb

## 2014-02-23 DIAGNOSIS — Z01419 Encounter for gynecological examination (general) (routine) without abnormal findings: Secondary | ICD-10-CM | POA: Insufficient documentation

## 2014-02-23 DIAGNOSIS — Z1212 Encounter for screening for malignant neoplasm of rectum: Secondary | ICD-10-CM

## 2014-02-23 NOTE — Progress Notes (Signed)
Patient ID: Tracy Knight, female   DOB: 1961-06-26, 53 y.o.   MRN: [id number] Subjective:     Tracy Knight is a 53 y.o. female here for a routine exam.  No LMP recorded. Patient is postmenopausal. No obstetric history on file. Birth Control Method:  na Menstrual Calendar(currently): post menopausal  Current complaints: none.   Current acute medical issues:  none   Recent Gynecologic History No LMP recorded. Patient is postmenopausal. Last Pap: 2014,  normal Last mammogram: 02/2013,  normal  Past Medical History  Diagnosis Date  . Diabetes mellitus without complication   . Hypertension   . Asthma   . Dyslipidemia   . History of nuclear stress test 02/2009    exercise; normal pattern of perfusion; no significant ischemia demonstrated; low risk scan     Past Surgical History  Procedure Laterality Date  . Removal cyst on chin    . Cardiac catheterization  07/2003    no evidence of CAD  . Transthoracic echocardiogram  03/2007    EF=>55%; borderline LA enlargement; MV mildly thickened/borderline MVP/mild MR    OB History   Grav Para Term Preterm Abortions TAB SAB Ect Mult Living                  History   Social History  . Marital Status: Married    Spouse Name: N/A    Number of Children: N/A  . Years of Education: N/A   Social History Main Topics  . Smoking status: Never Smoker   . Smokeless tobacco: Never Used     Comment: never smoke.  Marland Kitchen Alcohol Use: No  . Drug Use: No  . Sexual Activity: Yes   Other Topics Concern  . None   Social History Narrative  . None    Family History  Problem Relation Age of Onset  . Heart disease Mother   . Heart disease Father      Review of Systems  Review of Systems  Constitutional: Negative for fever, chills, weight loss, malaise/fatigue and diaphoresis.  HENT: Negative for hearing loss, ear pain, nosebleeds, congestion, sore throat, neck pain, tinnitus and ear discharge.   Eyes: Negative for blurred vision, double  vision, photophobia, pain, discharge and redness.  Respiratory: Negative for cough, hemoptysis, sputum production, shortness of breath, wheezing and stridor.   Cardiovascular: Negative for chest pain, palpitations, orthopnea, claudication, leg swelling and PND.  Gastrointestinal: negative for abdominal pain. Negative for heartburn, nausea, vomiting, diarrhea, constipation, blood in stool and melena.  Genitourinary: Negative for dysuria, urgency, frequency, hematuria and flank pain.  Musculoskeletal: Negative for myalgias, back pain, joint pain and falls.  Skin: Negative for itching and rash.  Neurological: Negative for dizziness, tingling, tremors, sensory change, speech change, focal weakness, seizures, loss of consciousness, weakness and headaches.  Endo/Heme/Allergies: Negative for environmental allergies and polydipsia. Does not bruise/bleed easily.  Psychiatric/Behavioral: Negative for depression, suicidal ideas, hallucinations, memory loss and substance abuse. The patient is not nervous/anxious and does not have insomnia.        Objective:    Physical Exam  Vitals reviewed. Constitutional: She is oriented to person, place, and time. She appears well-developed and well-nourished.  HENT:  Head: Normocephalic and atraumatic.        Right Ear: External ear normal.  Left Ear: External ear normal.  Nose: Nose normal.  Mouth/Throat: Oropharynx is clear and moist.  Eyes: Conjunctivae and EOM are normal. Pupils are equal, round, and reactive to light. Right eye exhibits no  discharge. Left eye exhibits no discharge. No scleral icterus.  Neck: Normal range of motion. Neck supple. No tracheal deviation present. No thyromegaly present.  Cardiovascular: Normal rate, regular rhythm, normal heart sounds and intact distal pulses.  Exam reveals no gallop and no friction rub.   No murmur heard. Respiratory: Effort normal and breath sounds normal. No respiratory distress. She has no wheezes. She has no  rales. She exhibits no tenderness.  GI: Soft. Bowel sounds are normal. She exhibits no distension and no mass. There is no tenderness. There is no rebound and no guarding.  Genitourinary:  Breasts no masses skin changes or nipple changes bilaterally      Vulva is normal without lesions Vagina is pink moist without discharge Cervix normal in appearance and pap is done Uterus is normal size shape and contour Adnexa is negative with normal sized ovaries  Rectal    hemoccult negative, normal tone, no masses  Musculoskeletal: Normal range of motion. She exhibits no edema and no tenderness.  Neurological: She is alert and oriented to person, place, and time. She has normal reflexes. She displays normal reflexes. No cranial nerve deficit. She exhibits normal muscle tone. Coordination normal.  Skin: Skin is warm and dry. No rash noted. No erythema. No pallor.  Psychiatric: She has a normal mood and affect. Her behavior is normal. Judgment and thought content normal.       Assessment:    Healthy female exam.    Plan:    Mammogram ordered. Follow up in: 1 year.

## 2014-02-23 NOTE — Addendum Note (Signed)
Addended by: Doyne Keel on: 02/23/2014 10:59 AM   Modules accepted: Orders

## 2014-02-27 LAB — CYTOLOGY - PAP

## 2014-03-01 ENCOUNTER — Ambulatory Visit (INDEPENDENT_AMBULATORY_CARE_PROVIDER_SITE_OTHER): Payer: BC Managed Care – PPO | Admitting: Cardiovascular Disease

## 2014-03-01 ENCOUNTER — Encounter: Payer: Self-pay | Admitting: Cardiovascular Disease

## 2014-03-01 VITALS — BP 140/82 | HR 89 | Resp 16 | Ht 65.0 in | Wt 200.8 lb

## 2014-03-01 DIAGNOSIS — R002 Palpitations: Secondary | ICD-10-CM | POA: Diagnosis not present

## 2014-03-01 DIAGNOSIS — I10 Essential (primary) hypertension: Secondary | ICD-10-CM | POA: Diagnosis not present

## 2014-03-01 DIAGNOSIS — E782 Mixed hyperlipidemia: Secondary | ICD-10-CM | POA: Diagnosis not present

## 2014-03-01 MED ORDER — DILTIAZEM HCL ER BEADS 360 MG PO CP24: 360.0000 mg | ORAL_CAPSULE | Freq: Every day | ORAL | Status: DC

## 2014-03-01 NOTE — Patient Instructions (Signed)
Dr. Croitoru recommends that you schedule a follow-up appointment in: One Year.   

## 2014-03-01 NOTE — Progress Notes (Signed)
Patient ID: Tracy Knight, female   DOB: [birth year], 53 y.o.   MRN: [id number]      Reason for office visit Palpitations, hypertension, coronary risk factors  Tracy Knight has general he done very well with no cardiac complaints. Recently her allergies and asthma he started to flareup and with this has noticed a slight increase in her palpitations. These remain generally well controlled. As before, her palpitations occur at rest, never with physical activity. She has well treated diabetes mellitus, mixed hyperlipidemia and hypertension. Cardiac catheterization in 2005 showed normal coronary arteries. Her most recent echocardiogram showed normal left ventricular ejection fraction, borderline left atrial enlargement and borderline criteria for mitral valve prolapse with mild mitral insufficiency. She remains overweight, but has lost another 3 pounds since last year.    No Known Allergies  Current Outpatient Prescriptions  Medication Sig Dispense Refill  . cholecalciferol (VITAMIN D) 1000 UNITS tablet Take 1,000 Units by mouth daily.      . Cyanocobalamin (VITAMIN B-12 PO) Take by mouth daily.      Marland Kitchen diltiazem (TIAZAC) 360 MG 24 hr capsule Take 1 capsule (360 mg total) by mouth daily.  30 capsule  11  . ferrous fumarate (FERRO-SEQUELS) 50 MG CR tablet Take 45 mg by mouth once.      . fish oil-omega-3 fatty acids 1000 MG capsule Take 1 g by mouth daily.      . GuaiFENesin (MUCINEX PO) Take by mouth as needed.      Marland Kitchen HYDROMET 5-1.5 MG/5ML syrup Take 5 mLs by mouth every 4 (four) hours.      . IBUPROFEN PO Take by mouth as needed.      . Ipratropium-Albuterol (COMBIVENT IN) Inhale into the lungs as needed.      Marland Kitchen levofloxacin (LEVAQUIN) 500 MG tablet Take 500 mg by mouth daily.      Marland Kitchen losartan (COZAAR) 50 MG tablet Take 1 tablet (50 mg total) by mouth daily.  90 tablet  3  . metFORMIN (GLUMETZA) 500 MG (MOD) 24 hr tablet Take 500 mg by mouth daily with breakfast.      . montelukast (SINGULAIR) 10 MG  tablet Take 10 mg by mouth every morning.      . simvastatin (ZOCOR) 20 MG tablet Take 20 mg by mouth at bedtime.      . triamterene-hydrochlorothiazide (DYAZIDE) 37.5-25 MG per capsule Take 1 capsule by mouth every morning.       No current facility-administered medications for this visit.    Past Medical History  Diagnosis Date  . Diabetes mellitus without complication   . Hypertension   . Asthma   . Dyslipidemia   . History of nuclear stress test 02/2009    exercise; normal pattern of perfusion; no significant ischemia demonstrated; low risk scan     Past Surgical History  Procedure Laterality Date  . Removal cyst on chin    . Cardiac catheterization  07/2003    no evidence of CAD  . Transthoracic echocardiogram  03/2007    EF=>55%; borderline LA enlargement; MV mildly thickened/borderline MVP/mild MR    Family History  Problem Relation Age of Onset  . Heart disease Mother   . Heart disease Father     History   Social History  . Marital Status: Married    Spouse Name: N/A    Number of Children: N/A  . Years of Education: N/A   Occupational History  . Not on file.   Social History Main Topics  .  Smoking status: Never Smoker   . Smokeless tobacco: Never Used     Comment: never smoke.  Marland Kitchen Alcohol Use: No  . Drug Use: No  . Sexual Activity: Yes   Other Topics Concern  . Not on file   Social History Narrative  . No narrative on file    Review of systems: The patient specifically denies any chest pain at rest or with exertion, dyspnea at rest or with exertion, orthopnea, paroxysmal nocturnal dyspnea, syncope, focal neurological deficits, intermittent claudication, lower extremity edema, unexplained weight gain, cough, hemoptysis or wheezing.  The patient also denies abdominal pain, nausea, vomiting, dysphagia, diarrhea, constipation, polyuria, polydipsia, dysuria, hematuria, frequency, urgency, abnormal bleeding or bruising, fever, chills, unexpected weight  changes, mood swings, change in skin or hair texture, change in voice quality, auditory or visual problems, allergic reactions or rashes, new musculoskeletal complaints other than usual "aches and pains".   PHYSICAL EXAM BP 140/82  Pulse 89  Resp 16  Ht 5\' 5"  (1.651 m)  Wt 200 lb 12.8 oz (91.082 kg)  BMI 33.41 kg/m2 General: Alert, oriented x3, no distress  Head: no evidence of trauma, PERRL, EOMI, no exophtalmos or lid lag, no myxedema, no xanthelasma; normal ears, nose and oropharynx  Neck: normal jugular venous pulsations and no hepatojugular reflux; brisk carotid pulses without delay and no carotid bruits  Chest: A few scattered wheezes, no signs of consolidation by percussion or palpation, normal fremitus, symmetrical and full respiratory excursions  Cardiovascular: normal position and quality of the apical impulse, regular rhythm, normal first and second heart sounds, no murmurs, rubs or gallops  Abdomen: no tenderness or distention, no masses by palpation, no abnormal pulsatility or arterial bruits, normal bowel sounds, no hepatosplenomegaly  Extremities: no clubbing, cyanosis or edema; 2+ radial, ulnar and brachial pulses bilaterally; 2+ right femoral, posterior tibial and dorsalis pedis pulses; 2+ left femoral, posterior tibial and dorsalis pedis pulses; no subclavian or femoral bruits  Neurological: grossly nonfocal   EKG: Sinus rhythm, possible left atrial abnormality, diffuse nonspecific ST segment sagging, tracing unchanged from previous  Lipid Panel     Component Value Date/Time   CHOL 167 04/06/2013 1049   TRIG 52 04/06/2013 1049   HDL 72 04/06/2013 1049   CHOLHDL 2.3 04/06/2013 1049   VLDL 10 04/06/2013 1049   LDLCALC 85 04/06/2013 1049    BMET    Component Value Date/Time   NA 140 04/06/2013 1049   K 3.8 04/06/2013 1049   CL 98 04/06/2013 1049   CO2 35* 04/06/2013 1049   GLUCOSE 98 04/06/2013 1049   BUN 19 04/06/2013 1049   CREATININE 0.90 04/06/2013 1049   CALCIUM 10.1  04/06/2013 1049     ASSESSMENT AND PLAN  Palpitations  She appears to be currently minimally symptomatic with this. She was on beta blocker therapy, but. Carvedilol was causing problems with reactive airway disease. Diltiazem seems to be working well except when she has respiratory problems. If she notices that the palpitations become more troublesome, we might call her in immediate release diltiazem to use as needed.  Type 2 diabetes mellitus  Good control with hemoglobin A1c less than 7%   Hypertension  Fair control  Hyperlipidemia, mixed  Excellent parameters.    Orders Placed This Encounter  Procedures  . EKG 12-Lead   Meds ordered this encounter  Medications  . HYDROMET 5-1.5 MG/5ML syrup    Sig: Take 5 mLs by mouth every 4 (four) hours.  Marland Kitchen levofloxacin (LEVAQUIN) 500 MG  tablet    Sig: Take 500 mg by mouth daily.  Marland Kitchen diltiazem (TIAZAC) 360 MG 24 hr capsule    Sig: Take 1 capsule (360 mg total) by mouth daily.    Dispense:  30 capsule    Refill:  [address] [place], [facility] [phone] office [phone] pager

## 2014-03-25 ENCOUNTER — Other Ambulatory Visit: Payer: Self-pay | Admitting: Cardiovascular Disease

## 2014-03-27 NOTE — Telephone Encounter (Signed)
Rx was sent to pharmacy electronically. 

## 2014-06-26 ENCOUNTER — Other Ambulatory Visit: Payer: Self-pay | Admitting: Family Medicine

## 2014-06-26 ENCOUNTER — Other Ambulatory Visit (HOSPITAL_COMMUNITY): Payer: Self-pay | Admitting: Maternal & Fetal Medicine

## 2014-06-26 DIAGNOSIS — Z139 Encounter for screening, unspecified: Secondary | ICD-10-CM

## 2014-06-28 ENCOUNTER — Ambulatory Visit (HOSPITAL_COMMUNITY)
Admission: RE | Admit: 2014-06-28 | Discharge: 2014-06-28 | Disposition: A | Discharge status: Home / Self Care | Payer: BC Managed Care – PPO | Source: Ambulatory Visit | Attending: Maternal & Fetal Medicine | Admitting: Maternal & Fetal Medicine

## 2014-06-28 DIAGNOSIS — Z1231 Encounter for screening mammogram for malignant neoplasm of breast: Secondary | ICD-10-CM | POA: Insufficient documentation

## 2014-06-28 DIAGNOSIS — Z139 Encounter for screening, unspecified: Secondary | ICD-10-CM

## 2015-03-01 ENCOUNTER — Encounter: Payer: Self-pay | Admitting: Obstetrics & Gynecology

## 2015-03-01 ENCOUNTER — Other Ambulatory Visit (HOSPITAL_COMMUNITY)
Admission: RE | Admit: 2015-03-01 | Discharge: 2015-03-01 | Disposition: A | Discharge status: Home / Self Care | Payer: BLUE CROSS/BLUE SHIELD | Source: Ambulatory Visit | Attending: Obstetrics & Gynecology | Admitting: Obstetrics & Gynecology

## 2015-03-01 ENCOUNTER — Ambulatory Visit (INDEPENDENT_AMBULATORY_CARE_PROVIDER_SITE_OTHER): Payer: BLUE CROSS/BLUE SHIELD | Admitting: Obstetrics & Gynecology

## 2015-03-01 VITALS — BP 100/70 | HR 72 | Wt 207.0 lb

## 2015-03-01 DIAGNOSIS — Z01419 Encounter for gynecological examination (general) (routine) without abnormal findings: Secondary | ICD-10-CM | POA: Diagnosis not present

## 2015-03-01 DIAGNOSIS — Z1211 Encounter for screening for malignant neoplasm of colon: Secondary | ICD-10-CM

## 2015-03-01 DIAGNOSIS — Z1212 Encounter for screening for malignant neoplasm of rectum: Secondary | ICD-10-CM

## 2015-03-01 NOTE — Progress Notes (Signed)
Patient ID: Tracy Knight, female   DOB: Jun 02, 1961, 54 y.o.   MRN: [id number] Subjective:     Tracy Knight is a 54 y.o. female here for a routine exam.  No LMP recorded. Patient is postmenopausal. No obstetric history on file. Birth Control Method:  Post menopausal Menstrual Calendar(currently): amenorrheic  Current complaints: none.   Current acute medical issues:  Asthma, diabetes   Recent Gynecologic History No LMP recorded. Patient is postmenopausal. Last Pap: 2015,  normal Last mammogram: 2015,  normal  Past Medical History  Diagnosis Date  . Diabetes mellitus without complication   . Hypertension   . Asthma   . Dyslipidemia   . History of nuclear stress test 02/2009    exercise; normal pattern of perfusion; no significant ischemia demonstrated; low risk scan     Past Surgical History  Procedure Laterality Date  . Removal cyst on chin    . Cardiac catheterization  07/2003    no evidence of CAD  . Transthoracic echocardiogram  03/2007    EF=>55%; borderline LA enlargement; MV mildly thickened/borderline MVP/mild MR    OB History    No data available      Social History   Social History  . Marital Status: Married    Spouse Name: N/A  . Number of Children: N/A  . Years of Education: N/A   Social History Main Topics  . Smoking status: Never Smoker   . Smokeless tobacco: Never Used     Comment: never smoke.  Marland Kitchen Alcohol Use: No  . Drug Use: No  . Sexual Activity: Yes   Other Topics Concern  . None   Social History Narrative    Family History  Problem Relation Age of Onset  . Heart disease Mother   . Heart disease Father      Current outpatient prescriptions:  .  cholecalciferol (VITAMIN D) 1000 UNITS tablet, Take 1,000 Units by mouth daily., Disp: , Rfl:  .  Cyanocobalamin (VITAMIN B-12 PO), Take by mouth daily., Disp: , Rfl:  .  diltiazem (TIAZAC) 360 MG 24 hr capsule, Take 1 capsule (360 mg total) by mouth daily., Disp: 30 capsule, Rfl: 11 .  ferrous  fumarate (FERRO-SEQUELS) 50 MG CR tablet, Take 45 mg by mouth once., Disp: , Rfl:  .  fish oil-omega-3 fatty acids 1000 MG capsule, Take 1 g by mouth daily., Disp: , Rfl:  .  Ipratropium-Albuterol (COMBIVENT IN), Inhale into the lungs as needed., Disp: , Rfl:  .  losartan (COZAAR) 50 MG tablet, TAKE 1 TABLET BY MOUTH DAILY, Disp: 90 tablet, Rfl: 3 .  metFORMIN (GLUMETZA) 500 MG (MOD) 24 hr tablet, Take 500 mg by mouth daily with breakfast., Disp: , Rfl:  .  montelukast (SINGULAIR) 10 MG tablet, Take 10 mg by mouth every morning., Disp: , Rfl:  .  simvastatin (ZOCOR) 20 MG tablet, Take 20 mg by mouth at bedtime., Disp: , Rfl:  .  triamterene-hydrochlorothiazide (DYAZIDE) 37.5-25 MG per capsule, Take 1 capsule by mouth every morning., Disp: , Rfl:  .  GuaiFENesin (MUCINEX PO), Take by mouth as needed., Disp: , Rfl:  .  HYDROMET 5-1.5 MG/5ML syrup, Take 5 mLs by mouth every 4 (four) hours., Disp: , Rfl:  .  IBUPROFEN PO, Take by mouth as needed., Disp: , Rfl:  .  levofloxacin (LEVAQUIN) 500 MG tablet, Take 500 mg by mouth daily., Disp: , Rfl:   Review of Systems  Review of Systems  Constitutional: Negative for fever, chills, weight  loss, malaise/fatigue and diaphoresis.  HENT: Negative for hearing loss, ear pain, nosebleeds, congestion, sore throat, neck pain, tinnitus and ear discharge.   Eyes: Negative for blurred vision, double vision, photophobia, pain, discharge and redness.  Respiratory: Negative for cough, hemoptysis, sputum production, shortness of breath, wheezing and stridor.   Cardiovascular: Negative for chest pain, palpitations, orthopnea, claudication, leg swelling and PND.  Gastrointestinal: negative for abdominal pain. Negative for heartburn, nausea, vomiting, diarrhea, constipation, blood in stool and melena.  Genitourinary: Negative for dysuria, urgency, frequency, hematuria and flank pain.  Musculoskeletal: Negative for myalgias, back pain, joint pain and falls.  Skin:  Negative for itching and rash.  Neurological: Negative for dizziness, tingling, tremors, sensory change, speech change, focal weakness, seizures, loss of consciousness, weakness and headaches.  Endo/Heme/Allergies: Negative for environmental allergies and polydipsia. Does not bruise/bleed easily.  Psychiatric/Behavioral: Negative for depression, suicidal ideas, hallucinations, memory loss and substance abuse. The patient is not nervous/anxious and does not have insomnia.        Objective:  Blood pressure 100/70, pulse 72, weight 207 lb (93.895 kg).   Physical Exam  Vitals reviewed. Constitutional: She is oriented to person, place, and time. She appears well-developed and well-nourished.  HENT:  Head: Normocephalic and atraumatic.        Right Ear: External ear normal.  Left Ear: External ear normal.  Nose: Nose normal.  Mouth/Throat: Oropharynx is clear and moist.  Eyes: Conjunctivae and EOM are normal. Pupils are equal, round, and reactive to light. Right eye exhibits no discharge. Left eye exhibits no discharge. No scleral icterus.  Neck: Normal range of motion. Neck supple. No tracheal deviation present. No thyromegaly present.  Cardiovascular: Normal rate, regular rhythm, normal heart sounds and intact distal pulses.  Exam reveals no gallop and no friction rub.   No murmur heard. Respiratory: Effort normal and breath sounds normal. No respiratory distress. She has no wheezes. She has no rales. She exhibits no tenderness.  GI: Soft. Bowel sounds are normal. She exhibits no distension and no mass. There is no tenderness. There is no rebound and no guarding.  Genitourinary:  Breasts no masses skin changes or nipple changes bilaterally      Vulva is normal without lesions Vagina is pink moist without discharge Cervix normal in appearance and pap is done Uterus is normal size shape and contour Adnexa is negative with normal sized ovaries  {Rectal    hemoccult negative, normal tone, no  masses  Musculoskeletal: Normal range of motion. She exhibits no edema and no tenderness.  Neurological: She is alert and oriented to person, place, and time. She has normal reflexes. She displays normal reflexes. No cranial nerve deficit. She exhibits normal muscle tone. Coordination normal.  Skin: Skin is warm and dry. No rash noted. No erythema. No pallor.  Psychiatric: She has a normal mood and affect. Her behavior is normal. Judgment and thought content normal.       Assessment:    Healthy female exam.    Plan:    Mammogram ordered. Follow up in: 1 year.

## 2015-03-02 LAB — CYTOLOGY - PAP

## 2015-03-14 ENCOUNTER — Ambulatory Visit (INDEPENDENT_AMBULATORY_CARE_PROVIDER_SITE_OTHER): Payer: BLUE CROSS/BLUE SHIELD | Admitting: Cardiovascular Disease

## 2015-03-14 VITALS — BP 110/80 | HR 69 | Ht 65.0 in | Wt 207.5 lb

## 2015-03-14 DIAGNOSIS — R002 Palpitations: Secondary | ICD-10-CM

## 2015-03-14 DIAGNOSIS — I10 Essential (primary) hypertension: Secondary | ICD-10-CM

## 2015-03-14 MED ORDER — DILTIAZEM HCL ER BEADS 360 MG PO CP24: 360.0000 mg | ORAL_CAPSULE | Freq: Every day | ORAL | Status: DC

## 2015-03-14 MED ORDER — LOSARTAN POTASSIUM 50 MG PO TABS: 50.0000 mg | ORAL_TABLET | Freq: Every day | ORAL | Status: DC

## 2015-03-14 NOTE — Patient Instructions (Signed)
Your physician wants you to follow-up in: 1 year or sooner if needed. You will receive a reminder letter in the mail two months in advance. If you don't receive a letter, please call our office to schedule the follow-up appointment.  

## 2015-03-16 ENCOUNTER — Encounter: Payer: Self-pay | Admitting: Cardiovascular Disease

## 2015-03-16 NOTE — Progress Notes (Signed)
Patient ID: Tracy Knight, female   DOB: 1960/11/01, 54 y.o.   MRN: [id number]     Cardiology Office Note   Date:  03/16/2015   ID:  Tracy Knight, DOB [birth year], MRN [id number]  PCP:  Tracy Kilts, MD  Cardiologist:   Tracy Klein, MD   Chief Complaint  Patient presents with  . Annual Exam    patient reports no problems since last visit.      History of Present Illness: Tracy Knight is a 54 y.o. female who presents for  Follow-up for palpitations, hypertension and hyperlipidemia.   she feels very well. She has no complaints of palpitations, headaches, dyspnea, angina , lower extremity edema, focal neurological deficits or intermittent claudication.  She had labs performed just last Wednesday with Dr. Hilma Favors and they were "fine". She exercises 3 days a week at the [facility].  Unfortunately she has gained back some of the weight that she had lost.  She has well treated diabetes mellitus, mixed hyperlipidemia and hypertension. Cardiac catheterization in 2005 showed normal coronary arteries. Her most recent echocardiogram showed normal left ventricular ejection fraction, borderline left atrial enlargement and borderline criteria for mitral valve prolapse with mild mitral insufficiency.    Past Medical History  Diagnosis Date  . Diabetes mellitus without complication   . Hypertension   . Asthma   . Dyslipidemia   . History of nuclear stress test 02/2009    exercise; normal pattern of perfusion; no significant ischemia demonstrated; low risk scan     Past Surgical History  Procedure Laterality Date  . Removal cyst on chin    . Cardiac catheterization  07/2003    no evidence of CAD  . Transthoracic echocardiogram  03/2007    EF=>55%; borderline LA enlargement; MV mildly thickened/borderline MVP/mild MR     Current Outpatient Prescriptions  Medication Sig Dispense Refill  . cholecalciferol (VITAMIN D) 1000 UNITS tablet Take 1,000 Units by mouth daily.    . Cyanocobalamin  (VITAMIN B-12 PO) Take by mouth daily.    Marland Kitchen diltiazem (TIAZAC) 360 MG 24 hr capsule Take 1 capsule (360 mg total) by mouth daily. 90 capsule 3  . ferrous fumarate (FERRO-SEQUELS) 50 MG CR tablet Take 45 mg by mouth once.    . fish oil-omega-3 fatty acids 1000 MG capsule Take 1 g by mouth daily.    . GuaiFENesin (MUCINEX PO) Take by mouth as needed.    . Ipratropium-Albuterol (COMBIVENT IN) Inhale into the lungs as needed.    Marland Kitchen losartan (COZAAR) 50 MG tablet Take 1 tablet (50 mg total) by mouth daily. 90 tablet 3  . metFORMIN (GLUMETZA) 500 MG (MOD) 24 hr tablet Take 500 mg by mouth daily with breakfast.    . montelukast (SINGULAIR) 10 MG tablet Take 10 mg by mouth every morning.    . simvastatin (ZOCOR) 20 MG tablet Take 20 mg by mouth at bedtime.    . triamterene-hydrochlorothiazide (DYAZIDE) 37.5-25 MG per capsule Take 1 capsule by mouth every morning.     No current facility-administered medications for this visit.    Allergies:   Review of patient's allergies indicates no known allergies.    Social History:  The patient  reports that she has never smoked. She has never used smokeless tobacco. She reports that she does not drink alcohol or use illicit drugs.   Family History:  The patient's family history includes Heart disease in her father and mother.    ROS:  Please see the history  of present illness.    Otherwise, review of systems positive for none.   All other systems are reviewed and negative.    PHYSICAL EXAM: VS:  BP 110/80 mmHg  Pulse 69  Ht 5\' 5"  (1.651 m)  Wt 207 lb 8 oz (94.121 kg)  BMI 34.53 kg/m2 , BMI Body mass index is 34.53 kg/(m^2).  General: Alert, oriented x3, no distress Head: no evidence of trauma, PERRL, EOMI, no exophtalmos or lid lag, no myxedema, no xanthelasma; normal ears, nose and oropharynx Neck: normal jugular venous pulsations and no hepatojugular reflux; brisk carotid pulses without delay and no carotid bruits Chest: clear to auscultation, no  signs of consolidation by percussion or palpation, normal fremitus, symmetrical and full respiratory excursions Cardiovascular: normal position and quality of the apical impulse, regular rhythm, normal first and second heart sounds, no  murmurs, rubs or gallops Abdomen: no tenderness or distention, no masses by palpation, no abnormal pulsatility or arterial bruits, normal bowel sounds, no hepatosplenomegaly Extremities: no clubbing, cyanosis or edema; 2+ radial, ulnar and brachial pulses bilaterally; 2+ right femoral, posterior tibial and dorsalis pedis pulses; 2+ left femoral, posterior tibial and dorsalis pedis pulses; no subclavian or femoral bruits Neurological: grossly nonfocal Psych: euthymic mood, full affect   EKG:  EKG is ordered today. The ekg ordered today demonstrates  Sinus rhythm with a single PAC, QTC 443 ms   Recent Labs: No results found for requested labs within last 365 days.    Lipid Panel    Component Value Date/Time   CHOL 167 04/06/2013 1049   TRIG 52 04/06/2013 1049   HDL 72 04/06/2013 1049   CHOLHDL 2.3 04/06/2013 1049   VLDL 10 04/06/2013 1049   LDLCALC 85 04/06/2013 1049      Wt Readings from Last 3 Encounters:  03/14/15 207 lb 8 oz (94.121 kg)  03/01/15 207 lb (93.895 kg)  03/01/14 200 lb 12.8 oz (91.082 kg)      ASSESSMENT AND PLAN:  Palpitations  She appears to be currently minimally symptomatic with this. She was on beta blocker therapy, but.carvedilol was causing problems with reactive airway disease. Diltiazem seems to be working well.  Type 2 diabetes mellitus  Good control per her report   Hypertension  Fair control  Hyperlipidemia, mixed  Excellent parameters in past. Will retrieve the more recent results from Dr. Cornelia Copa office    Current medicines are reviewed at length with the patient today.  The patient does not have concerns regarding medicines.  The following changes have been made:  no change  Labs/ tests ordered  today include:  Orders Placed This Encounter  Procedures  . EKG 12-Lead     Patient Instructions  Your physician wants you to follow-up in:1 year or sooner if needed. You will receive a reminder letter in the mail two months in advance. If you don't receive a letter, please call our office to schedule the follow-up appointment.     Mikael Spray, MD  03/16/2015 10:59 AM    Tracy Klein, MD, [facility] [phone] office [phone] pager

## 2015-07-20 ENCOUNTER — Other Ambulatory Visit (HOSPITAL_COMMUNITY): Payer: Self-pay | Admitting: Family Medicine

## 2015-07-20 DIAGNOSIS — Z1231 Encounter for screening mammogram for malignant neoplasm of breast: Secondary | ICD-10-CM

## 2015-07-25 ENCOUNTER — Ambulatory Visit (HOSPITAL_COMMUNITY)
Admission: RE | Admit: 2015-07-25 | Discharge: 2015-07-25 | Disposition: A | Discharge status: Home / Self Care | Payer: BLUE CROSS/BLUE SHIELD | Source: Ambulatory Visit | Attending: Family Medicine | Admitting: Family Medicine

## 2015-07-25 DIAGNOSIS — Z1231 Encounter for screening mammogram for malignant neoplasm of breast: Secondary | ICD-10-CM | POA: Diagnosis not present

## 2016-02-14 ENCOUNTER — Other Ambulatory Visit: Payer: Self-pay | Admitting: Cardiovascular Disease

## 2016-02-14 NOTE — Telephone Encounter (Signed)
REFILL 

## 2016-03-04 ENCOUNTER — Other Ambulatory Visit (HOSPITAL_COMMUNITY)
Admission: RE | Admit: 2016-03-04 | Discharge: 2016-03-04 | Disposition: A | Discharge status: Home / Self Care | Payer: BLUE CROSS/BLUE SHIELD | Source: Ambulatory Visit | Attending: Obstetrics & Gynecology | Admitting: Obstetrics & Gynecology

## 2016-03-04 ENCOUNTER — Encounter: Payer: Self-pay | Admitting: Obstetrics & Gynecology

## 2016-03-04 ENCOUNTER — Ambulatory Visit (INDEPENDENT_AMBULATORY_CARE_PROVIDER_SITE_OTHER): Payer: BLUE CROSS/BLUE SHIELD | Admitting: Obstetrics & Gynecology

## 2016-03-04 ENCOUNTER — Encounter (INDEPENDENT_AMBULATORY_CARE_PROVIDER_SITE_OTHER): Payer: Self-pay

## 2016-03-04 VITALS — BP 120/80 | HR 82 | Ht 65.0 in | Wt 207.0 lb

## 2016-03-04 DIAGNOSIS — Z1211 Encounter for screening for malignant neoplasm of colon: Secondary | ICD-10-CM

## 2016-03-04 DIAGNOSIS — Z01419 Encounter for gynecological examination (general) (routine) without abnormal findings: Secondary | ICD-10-CM | POA: Insufficient documentation

## 2016-03-04 DIAGNOSIS — Z1151 Encounter for screening for human papillomavirus (HPV): Secondary | ICD-10-CM | POA: Diagnosis present

## 2016-03-04 DIAGNOSIS — Z1212 Encounter for screening for malignant neoplasm of rectum: Secondary | ICD-10-CM

## 2016-03-04 NOTE — Progress Notes (Signed)
Subjective:     Tracy Knight is a 55 y.o. female here for a routine exam.  No LMP recorded. Patient is postmenopausal. No obstetric history on file. Birth Control Method:  BTL< post menopausal Menstrual Calendar(currently): amenorrheic  Current complaints: none.   Current acute medical issues:     Recent Gynecologic History No LMP recorded. Patient is postmenopausal. Last Pap: 2016,  normal Last mammogram: 07/26/2015,  normal  Past Medical History:  Diagnosis Date  . Asthma   . Diabetes mellitus without complication ([place])   . Dyslipidemia   . History of nuclear stress test 02/2009   exercise; normal pattern of perfusion; no significant ischemia demonstrated; low risk scan   . Hypertension     Past Surgical History:  Procedure Laterality Date  . CARDIAC CATHETERIZATION  07/2003   no evidence of CAD  . removal cyst on chin    . TRANSTHORACIC ECHOCARDIOGRAM  03/2007   EF=>55%; borderline LA enlargement; MV mildly thickened/borderline MVP/mild MR    OB History    No data available      Social History   Social History  . Marital status: Married    Spouse name: N/A  . Number of children: N/A  . Years of education: N/A   Social History Main Topics  . Smoking status: Never Smoker  . Smokeless tobacco: Never Used     Comment: never smoke.  Marland Kitchen Alcohol use No  . Drug use: No  . Sexual activity: Yes   Other Topics Concern  . None   Social History Narrative  . None    Family History  Problem Relation Age of Onset  . Heart disease Mother   . Heart disease Father      Current Outpatient Prescriptions:  .  cholecalciferol (VITAMIN D) 1000 UNITS tablet, Take 1,000 Units by mouth daily., Disp: , Rfl:  .  Cyanocobalamin (VITAMIN B-12 PO), Take by mouth daily., Disp: , Rfl:  .  diltiazem (TIAZAC) 360 MG 24 hr capsule, Take 1 capsule (360 mg total) by mouth daily. KEEP OV., Disp: 90 capsule, Rfl: 0 .  ferrous fumarate (FERRO-SEQUELS) 50 MG CR tablet, Take 45 mg by mouth  once., Disp: , Rfl:  .  fish oil-omega-3 fatty acids 1000 MG capsule, Take 1 g by mouth daily., Disp: , Rfl:  .  Ipratropium-Albuterol (COMBIVENT IN), Inhale into the lungs as needed., Disp: , Rfl:  .  losartan (COZAAR) 50 MG tablet, Take 1 tablet (50 mg total) by mouth daily., Disp: 90 tablet, Rfl: 3 .  metFORMIN (GLUMETZA) 500 MG (MOD) 24 hr tablet, Take 500 mg by mouth daily with breakfast., Disp: , Rfl:  .  montelukast (SINGULAIR) 10 MG tablet, Take 10 mg by mouth every morning., Disp: , Rfl:  .  simvastatin (ZOCOR) 20 MG tablet, Take 20 mg by mouth at bedtime., Disp: , Rfl:  .  triamterene-hydrochlorothiazide (DYAZIDE) 37.5-25 MG per capsule, Take 1 capsule by mouth every morning., Disp: , Rfl:  .  GuaiFENesin (MUCINEX PO), Take by mouth as needed., Disp: , Rfl:   Review of Systems  Review of Systems  Constitutional: Negative for fever, chills, weight loss, malaise/fatigue and diaphoresis.  HENT: Negative for hearing loss, ear pain, nosebleeds, congestion, sore throat, neck pain, tinnitus and ear discharge.   Eyes: Negative for blurred vision, double vision, photophobia, pain, discharge and redness.  Respiratory: Negative for cough, hemoptysis, sputum production, shortness of breath, wheezing and stridor.   Cardiovascular: Negative for chest pain, palpitations, orthopnea, claudication,  leg swelling and PND.  Gastrointestinal: negative for abdominal pain. Negative for heartburn, nausea, vomiting, diarrhea, constipation, blood in stool and melena.  Genitourinary: Negative for dysuria, urgency, frequency, hematuria and flank pain.  Musculoskeletal: Negative for myalgias, back pain, joint pain and falls.  Skin: Negative for itching and rash.  Neurological: Negative for dizziness, tingling, tremors, sensory change, speech change, focal weakness, seizures, loss of consciousness, weakness and headaches.  Endo/Heme/Allergies: Negative for environmental allergies and polydipsia. Does not  bruise/bleed easily.  Psychiatric/Behavioral: Negative for depression, suicidal ideas, hallucinations, memory loss and substance abuse. The patient is not nervous/anxious and does not have insomnia.        Objective:  Blood pressure 120/80, pulse 82, height 5\' 5"  (1.651 m), weight 207 lb (93.9 kg).   Physical Exam  Vitals reviewed. Constitutional: She is oriented to person, place, and time. She appears well-developed and well-nourished.  HENT:  Head: Normocephalic and atraumatic.        Right Ear: External ear normal.  Left Ear: External ear normal.  Nose: Nose normal.  Mouth/Throat: Oropharynx is clear and moist.  Eyes: Conjunctivae and EOM are normal. Pupils are equal, round, and reactive to light. Right eye exhibits no discharge. Left eye exhibits no discharge. No scleral icterus.  Neck: Normal range of motion. Neck supple. No tracheal deviation present. No thyromegaly present.  Cardiovascular: Normal rate, regular rhythm, normal heart sounds and intact distal pulses.  Exam reveals no gallop and no friction rub.   No murmur heard. Respiratory: Effort normal and breath sounds normal. No respiratory distress. She has no wheezes. She has no rales. She exhibits no tenderness.  GI: Soft. Bowel sounds are normal. She exhibits no distension and no mass. There is no tenderness. There is no rebound and no guarding.  Genitourinary:  Breasts no masses skin changes or nipple changes bilaterally      Vulva is normal without lesions Vagina is pink moist without discharge Cervix normal in appearance and pap is done Uterus is normal size shape and contour Adnexa is negative with normal sized ovaries  {Rectal    hemoccult negative, normal tone, no masses  Musculoskeletal: Normal range of motion. She exhibits no edema and no tenderness.  Neurological: She is alert and oriented to person, place, and time. She has normal reflexes. She displays normal reflexes. No cranial nerve deficit. She exhibits  normal muscle tone. Coordination normal.  Skin: Skin is warm and dry. No rash noted. No erythema. No pallor.  Psychiatric: She has a normal mood and affect. Her behavior is normal. Judgment and thought content normal.       Medications Ordered at today's visit: No orders of the defined types were placed in this encounter.   Other orders placed at today's visit: No orders of the defined types were placed in this encounter.     Assessment:    Healthy female exam.    Plan:    Mammogram ordered. Follow up in: 1 years.     Return in about 1 year (around 03/04/2017) for yearly, with Dr Elonda Husky.

## 2016-03-07 LAB — CYTOLOGY - PAP

## 2016-03-13 ENCOUNTER — Encounter: Payer: Self-pay | Admitting: Cardiovascular Disease

## 2016-03-13 ENCOUNTER — Ambulatory Visit (INDEPENDENT_AMBULATORY_CARE_PROVIDER_SITE_OTHER): Payer: BLUE CROSS/BLUE SHIELD | Admitting: Cardiovascular Disease

## 2016-03-13 VITALS — BP 142/88 | HR 79 | Ht 65.0 in | Wt 209.6 lb

## 2016-03-13 DIAGNOSIS — E668 Other obesity: Secondary | ICD-10-CM

## 2016-03-13 DIAGNOSIS — E119 Type 2 diabetes mellitus without complications: Secondary | ICD-10-CM | POA: Diagnosis not present

## 2016-03-13 DIAGNOSIS — R002 Palpitations: Secondary | ICD-10-CM | POA: Diagnosis not present

## 2016-03-13 DIAGNOSIS — I10 Essential (primary) hypertension: Secondary | ICD-10-CM | POA: Diagnosis not present

## 2016-03-13 DIAGNOSIS — E782 Mixed hyperlipidemia: Secondary | ICD-10-CM

## 2016-03-13 MED ORDER — PRAVASTATIN SODIUM 40 MG PO TABS: 40.0000 mg | ORAL_TABLET | Freq: Every evening | ORAL | 3 refills | Status: DC

## 2016-03-13 NOTE — Patient Instructions (Signed)
Dr Sallyanne Kuster has recommended making the following medication changes: 1. TAKE Diltiazem at 3 pm 2. STOP Simvastatin 3. START Pravastatin 40 mg - take 1 tablet by mouth at bedtime  Dr C recommends that you schedule a follow-up appointment in 1 year. You will receive a reminder letter in the mail two months in advance. If you don't receive a letter, please call our office to schedule the follow-up appointment.  If you need a refill on your cardiac medications before your next appointment, please call your pharmacy.

## 2016-03-13 NOTE — Progress Notes (Signed)
Cardiology Office Note    Date:  03/13/2016   ID:  Tracy Knight, DOB 10/13/1960, MRN [id number]  PCP:  Purvis Kilts, MD  Cardiologist:   Sanda Klein, MD   Chief Complaint  Patient presents with  . Follow-up    patient reports heart flutters that wake her up at night.     History of Present Illness:  Tracy Knight is a 55 y.o. female with obesity, hyperlipidemia, type 2 diabetes mellitus, hypertension and palpitations here for routine follow-up. She still has occasional palpitations that sometimes wake her up after falling asleep. She works second shift from 3 PM to 1 AM. She typically takes her diltiazem when she gets home at 1:00 in the morning. Her blood pressure was very slightly high today, but when she saw her obstetrician a week ago it was 120/80. She states that this is usually where her blood pressure runs. She has gained about a pound since her last appointment. Diabetes control is excellent with an A1c of 6.2% on metformin monotherapy. She complains of muscle aches and wonders whether they could be related to simvastatin.  She denies headaches, dyspnea, angina, focal neurological complaints, ankle edema, claudication or syncope. She continues to exercise 3 days a week at the [facility]. She had a remote cardiac catheterization in 2005 with normal coronary arteries. Her echo shows normal left ventricular ejection fraction, borderline left atrial enlargement and borderline criteria for mitral valve prolapse, mild mitral insufficiency. Past treatment carvedilol caused problems with wheezing and asthma    Past Medical History:  Diagnosis Date  . Asthma   . Diabetes mellitus without complication ([place])   . Dyslipidemia   . History of nuclear stress test 02/2009   exercise; normal pattern of perfusion; no significant ischemia demonstrated; low risk scan   . Hypertension     Past Surgical History:  Procedure Laterality Date  . CARDIAC CATHETERIZATION  07/2003   no evidence of  CAD  . removal cyst on chin    . TRANSTHORACIC ECHOCARDIOGRAM  03/2007   EF=>55%; borderline LA enlargement; MV mildly thickened/borderline MVP/mild MR    Current Medications: Outpatient Medications Prior to Visit  Medication Sig Dispense Refill  . cholecalciferol (VITAMIN D) 1000 UNITS tablet Take 1,000 Units by mouth daily.    . Cyanocobalamin (VITAMIN B-12 PO) Take by mouth daily.    Marland Kitchen diltiazem (TIAZAC) 360 MG 24 hr capsule Take 1 capsule (360 mg total) by mouth daily. KEEP OV. 90 capsule 0  . ferrous fumarate (FERRO-SEQUELS) 50 MG CR tablet Take 45 mg by mouth once.    . fish oil-omega-3 fatty acids 1000 MG capsule Take 1 g by mouth daily.    . GuaiFENesin (MUCINEX PO) Take by mouth as needed.    . Ipratropium-Albuterol (COMBIVENT IN) Inhale into the lungs as needed.    Marland Kitchen losartan (COZAAR) 50 MG tablet Take 1 tablet (50 mg total) by mouth daily. 90 tablet 3  . metFORMIN (GLUMETZA) 500 MG (MOD) 24 hr tablet Take 500 mg by mouth daily with breakfast.    . montelukast (SINGULAIR) 10 MG tablet Take 10 mg by mouth every morning.    . triamterene-hydrochlorothiazide (DYAZIDE) 37.5-25 MG per capsule Take 1 capsule by mouth every morning.    . simvastatin (ZOCOR) 20 MG tablet Take 20 mg by mouth at bedtime.     No facility-administered medications prior to visit.      Allergies:   Review of patient's allergies indicates no known allergies.  Social History   Social History  . Marital status: Married    Spouse name: N/A  . Number of children: N/A  . Years of education: N/A   Social History Main Topics  . Smoking status: Never Smoker  . Smokeless tobacco: Never Used     Comment: never smoke.  Marland Kitchen Alcohol use No  . Drug use: No  . Sexual activity: Yes   Other Topics Concern  . Not on file   Social History Narrative  . No narrative on file     Family History:  The patient's family history includes Heart disease in her father and mother.   ROS:   Please see the history of  present illness.    ROS All other systems reviewed and are negative.   PHYSICAL EXAM:   VS:  BP (!) 142/88   Pulse 79   Ht 5\' 5"  (1.651 m)   Wt 209 lb 9.6 oz (95.1 kg)   BMI 34.88 kg/m    GEN: Well nourished, well developed, in no acute distress  HEENT: normal  Neck: no JVD, carotid bruits, or masses Cardiac: RRR; no murmurs, rubs, or gallops,no edema  Respiratory:  clear to auscultation bilaterally, normal work of breathing GI: soft, nontender, nondistended, + BS MS: no deformity or atrophy  Skin: warm and dry, no rash Neuro:  Alert and Oriented x 3, Strength and sensation are intact Psych: euthymic mood, full affect  Wt Readings from Last 3 Encounters:  03/13/16 209 lb 9.6 oz (95.1 kg)  03/04/16 207 lb (93.9 kg)  03/14/15 207 lb 8 oz (94.1 kg)      Studies/Labs Reviewed:   EKG:  EKG is ordered today.  The ekg ordered today demonstrates Sinus rhythm normal tracing  Recent Labs: No results found for requested labs within last 8760 hours.   Lipid Panel    Component Value Date/Time   CHOL 167 04/06/2013 1049   TRIG 52 04/06/2013 1049   HDL 72 04/06/2013 1049   CHOLHDL 2.3 04/06/2013 1049   VLDL 10 04/06/2013 1049   LDLCALC 85 04/06/2013 1049     ASSESSMENT:    1. Palpitations   2. Essential hypertension   3. Type 2 diabetes mellitus without complication, without long-term current use of insulin ([place])   4. Moderate obesity   5. Hyperlipidemia, mixed      PLAN:  In order of problems listed above:  1. Palpitations: Try taking the diltiazem before she goes to work so that the peak effect of the sustained-release stroke will hopefully occur around her bedtime. She did not tolerate beta blocker well is due to reactive airway disease in the past. 2. HTN: Generally well controlled. His room to increase the diltiazem to 480 mg daily if the recommended changes did not suffice to control her palpitations. 3. DM: Well controlled, but she is strongly encouraged to lose  weight. 4. Obesity: She can probably "cure" her diabetes and greatly reduce the need for blood pressure medications by losing weight 5. HLP: She reports having a lipid profile earlier this year with her primary care provider and had excellent results.    Medication Adjustments/Labs and Tests Ordered: Current medicines are reviewed at length with the patient today.  Concerns regarding medicines are outlined above.  Medication changes, Labs and Tests ordered today are listed in the Patient Instructions below. Patient Instructions  Dr Sallyanne Kuster has recommended making the following medication changes: 1. TAKE Diltiazem at 3 pm 2. STOP Simvastatin 3. START Pravastatin 40  mg - take 1 tablet by mouth at bedtime  Dr C recommends that you schedule a follow-up appointment in 1 year. You will receive a reminder letter in the mail two months in advance. If you don't receive a letter, please call our office to schedule the follow-up appointment.  If you need a refill on your cardiac medications before your next appointment, please call your pharmacy.    Signed, Sanda Klein, MD  03/13/2016 10:48 AM    [facility] [address], [place], [place]  [address] Phone: [phone]; Fax: [phone]

## 2016-04-08 ENCOUNTER — Other Ambulatory Visit: Payer: Self-pay | Admitting: Cardiovascular Disease

## 2016-06-19 ENCOUNTER — Other Ambulatory Visit: Payer: Self-pay | Admitting: Cardiovascular Disease

## 2016-08-25 ENCOUNTER — Other Ambulatory Visit (HOSPITAL_COMMUNITY): Payer: Self-pay | Admitting: Family Medicine

## 2016-08-25 DIAGNOSIS — Z1231 Encounter for screening mammogram for malignant neoplasm of breast: Secondary | ICD-10-CM

## 2016-09-02 ENCOUNTER — Encounter: Payer: Self-pay | Admitting: Internal Medicine

## 2016-09-08 DIAGNOSIS — R0602 Shortness of breath: Secondary | ICD-10-CM | POA: Diagnosis not present

## 2016-09-08 DIAGNOSIS — J343 Hypertrophy of nasal turbinates: Secondary | ICD-10-CM | POA: Diagnosis not present

## 2016-09-08 DIAGNOSIS — J069 Acute upper respiratory infection, unspecified: Secondary | ICD-10-CM | POA: Diagnosis not present

## 2016-09-08 DIAGNOSIS — Z6836 Body mass index (BMI) 36.0-36.9, adult: Secondary | ICD-10-CM | POA: Diagnosis not present

## 2016-09-08 DIAGNOSIS — J209 Acute bronchitis, unspecified: Secondary | ICD-10-CM | POA: Diagnosis not present

## 2016-09-08 DIAGNOSIS — Z1389 Encounter for screening for other disorder: Secondary | ICD-10-CM | POA: Diagnosis not present

## 2016-09-10 ENCOUNTER — Ambulatory Visit (HOSPITAL_COMMUNITY)
Admission: RE | Admit: 2016-09-10 | Discharge: 2016-09-10 | Disposition: A | Discharge status: Home / Self Care | Payer: BLUE CROSS/BLUE SHIELD | Source: Ambulatory Visit | Attending: Family Medicine | Admitting: Family Medicine

## 2016-09-10 DIAGNOSIS — Z1231 Encounter for screening mammogram for malignant neoplasm of breast: Secondary | ICD-10-CM | POA: Insufficient documentation

## 2016-10-14 DIAGNOSIS — Z1389 Encounter for screening for other disorder: Secondary | ICD-10-CM | POA: Diagnosis not present

## 2016-10-14 DIAGNOSIS — Z6835 Body mass index (BMI) 35.0-35.9, adult: Secondary | ICD-10-CM | POA: Diagnosis not present

## 2016-10-14 DIAGNOSIS — E119 Type 2 diabetes mellitus without complications: Secondary | ICD-10-CM | POA: Diagnosis not present

## 2016-10-14 DIAGNOSIS — E6609 Other obesity due to excess calories: Secondary | ICD-10-CM | POA: Diagnosis not present

## 2016-12-15 ENCOUNTER — Telehealth: Payer: Self-pay

## 2016-12-15 NOTE — Telephone Encounter (Signed)
[phone] PATIENT DUE FOR TCS BUT MAY NOT NEED AN OV

## 2016-12-16 NOTE — Telephone Encounter (Signed)
Route to [employer]

## 2016-12-16 NOTE — Telephone Encounter (Signed)
Hold iron X 7 days prior. No metformin the day of procedure.

## 2016-12-16 NOTE — Telephone Encounter (Signed)
Gastroenterology Pre-Procedure Review  Request Date: Requesting Physician: Dr.Golding  LAST TCS WAS4/11/11  PATIENT REVIEW QUESTIONS: The patient responded to the following health history questions as indicated:    1. Diabetes Melitis: YES 2. Joint replacements in the past 12 months: NO 3. Major health problems in the past 3 months: NO 4. Has an artificial valve or MVP: NO 5. Has a defibrillator: NO 6. Has been advised in past to take antibiotics in advance of a procedure like teeth cleaning: NO 7. Family history of colon cancer: NO 8. Alcohol Use: NO 9. History of sleep apnea: NO 10. History of coronary artery or other vascular stents placed within the last 12 months: NO    MEDICATIONS & ALLERGIES:    Patient reports the following regarding taking any blood thinners:   Plavix? NO Aspirin? YES Coumadin? NO Brilinta? NO Xarelto? NO Eliquis? NO Pradaxa? NO Savaysa? NO Effient? NO  Patient confirms/reports the following medications:  Current Outpatient Prescriptions  Medication Sig Dispense Refill  . cholecalciferol (VITAMIN D) 1000 UNITS tablet Take 1,000 Units by mouth daily.    Marland Kitchen diltiazem (TIAZAC) 360 MG 24 hr capsule TAKE 1 CAPSULE BY MOUTH DAILY 90 capsule 2  . ferrous fumarate (FERRO-SEQUELS) 50 MG CR tablet Take 45 mg by mouth once.    . fish oil-omega-3 fatty acids 1000 MG capsule Take 1 g by mouth daily.    . GuaiFENesin (MUCINEX PO) Take by mouth as needed.    . Ipratropium-Albuterol (COMBIVENT IN) Inhale into the lungs as needed.    Marland Kitchen losartan (COZAAR) 50 MG tablet TAKE 1 TABLET(50 MG) BY MOUTH DAILY 90 tablet 3  . metFORMIN (GLUMETZA) 500 MG (MOD) 24 hr tablet Take 500 mg by mouth daily with breakfast.    . montelukast (SINGULAIR) 10 MG tablet Take 10 mg by mouth every morning.    . triamterene-hydrochlorothiazide (DYAZIDE) 37.5-25 MG per capsule Take 1 capsule by mouth every morning.    . Cyanocobalamin (VITAMIN B-12 PO) Take by mouth daily.    . pravastatin  (PRAVACHOL) 40 MG tablet Take 1 tablet (40 mg total) by mouth every evening. 90 tablet 3   No current facility-administered medications for this visit.     Patient confirms/reports the following allergies:  No Known Allergies  No orders of the defined types were placed in this encounter.   AUTHORIZATION INFORMATION Primary Insurance: [facility],  ID #: [id number],  Group #: [id number] Pre-Cert / Josem Kaufmann required:  Pre-Cert / Auth #:   Secondary Insurance: BCBS,  [place] #: [id number],  Group #: [id number] Pre-Cert / Auth required:  Pre-Cert / Auth #:   SCHEDULE INFORMATION: Procedure has been scheduled as follows:  Date: , Time:   Location:   This Gastroenterology Pre-Precedure Review Form is being routed to the following provider(s): R. Garfield Cornea, MD

## 2016-12-17 ENCOUNTER — Other Ambulatory Visit: Payer: Self-pay

## 2016-12-17 DIAGNOSIS — Z86010 Personal history of colonic polyps: Secondary | ICD-10-CM

## 2016-12-17 MED ORDER — PEG 3350-KCL-NA BICARB-NACL 420 G PO SOLR: 4000.0000 mL | ORAL | 0 refills | Status: DC

## 2016-12-17 NOTE — Telephone Encounter (Signed)
Called pt. Colonoscopy with RMR scheduled for 02/17/17 at 10:00am. Informed pt to hold Iron x 7 days before procedure and no Metformin day of procedure (also noted on instructions). Rx for prep sent to pharmacy. Instructions mailed to pt. Orders entered.

## 2017-01-30 DIAGNOSIS — E1165 Type 2 diabetes mellitus with hyperglycemia: Secondary | ICD-10-CM | POA: Diagnosis not present

## 2017-01-30 DIAGNOSIS — Z1389 Encounter for screening for other disorder: Secondary | ICD-10-CM | POA: Diagnosis not present

## 2017-01-30 DIAGNOSIS — J4521 Mild intermittent asthma with (acute) exacerbation: Secondary | ICD-10-CM | POA: Diagnosis not present

## 2017-01-30 DIAGNOSIS — E782 Mixed hyperlipidemia: Secondary | ICD-10-CM | POA: Diagnosis not present

## 2017-01-30 DIAGNOSIS — Z6835 Body mass index (BMI) 35.0-35.9, adult: Secondary | ICD-10-CM | POA: Diagnosis not present

## 2017-01-30 DIAGNOSIS — I10 Essential (primary) hypertension: Secondary | ICD-10-CM | POA: Diagnosis not present

## 2017-02-17 ENCOUNTER — Encounter (HOSPITAL_COMMUNITY): Payer: Self-pay

## 2017-02-17 ENCOUNTER — Encounter (HOSPITAL_COMMUNITY)
Admission: RE | Disposition: A | Discharge status: Home / Self Care | Payer: Self-pay | Source: Ambulatory Visit | Attending: Internal Medicine

## 2017-02-17 ENCOUNTER — Ambulatory Visit (HOSPITAL_COMMUNITY)
Admission: RE | Admit: 2017-02-17 | Discharge: 2017-02-17 | Disposition: A | Discharge status: Home / Self Care | Payer: BLUE CROSS/BLUE SHIELD | Source: Ambulatory Visit | Attending: Internal Medicine | Admitting: Internal Medicine

## 2017-02-17 DIAGNOSIS — D124 Benign neoplasm of descending colon: Secondary | ICD-10-CM | POA: Diagnosis not present

## 2017-02-17 DIAGNOSIS — Z79899 Other long term (current) drug therapy: Secondary | ICD-10-CM | POA: Diagnosis not present

## 2017-02-17 DIAGNOSIS — Z86010 Personal history of colonic polyps: Secondary | ICD-10-CM

## 2017-02-17 DIAGNOSIS — J45909 Unspecified asthma, uncomplicated: Secondary | ICD-10-CM | POA: Diagnosis not present

## 2017-02-17 DIAGNOSIS — Z1211 Encounter for screening for malignant neoplasm of colon: Secondary | ICD-10-CM | POA: Diagnosis not present

## 2017-02-17 DIAGNOSIS — I10 Essential (primary) hypertension: Secondary | ICD-10-CM | POA: Insufficient documentation

## 2017-02-17 DIAGNOSIS — Z8249 Family history of ischemic heart disease and other diseases of the circulatory system: Secondary | ICD-10-CM | POA: Insufficient documentation

## 2017-02-17 DIAGNOSIS — E785 Hyperlipidemia, unspecified: Secondary | ICD-10-CM | POA: Insufficient documentation

## 2017-02-17 DIAGNOSIS — E119 Type 2 diabetes mellitus without complications: Secondary | ICD-10-CM | POA: Insufficient documentation

## 2017-02-17 DIAGNOSIS — Z7984 Long term (current) use of oral hypoglycemic drugs: Secondary | ICD-10-CM | POA: Insufficient documentation

## 2017-02-17 HISTORY — PX: POLYPECTOMY: SHX5525

## 2017-02-17 HISTORY — PX: COLONOSCOPY: SHX5424

## 2017-02-17 LAB — GLUCOSE, CAPILLARY: Glucose-Capillary: 123 mg/dL — ABNORMAL HIGH (ref 65–99)

## 2017-02-17 SURGERY — COLONOSCOPY
Anesthesia: Moderate Sedation

## 2017-02-17 MED ORDER — SODIUM CHLORIDE 0.9 % IV SOLN
INTRAVENOUS | Status: DC
  Administered 2017-02-17: 09:00:00 via INTRAVENOUS

## 2017-02-17 MED ORDER — MIDAZOLAM HCL 5 MG/5ML IJ SOLN
INTRAMUSCULAR | Status: AC
  Filled 2017-02-17: qty 10

## 2017-02-17 MED ORDER — STERILE WATER FOR IRRIGATION IR SOLN
Status: DC | PRN
  Administered 2017-02-17: 10:00:00

## 2017-02-17 MED ORDER — MEPERIDINE HCL 100 MG/ML IJ SOLN
INTRAMUSCULAR | Status: AC
  Filled 2017-02-17: qty 2

## 2017-02-17 MED ORDER — ONDANSETRON HCL 4 MG/2ML IJ SOLN
INTRAMUSCULAR | Status: DC | PRN
  Administered 2017-02-17: 4 mg via INTRAVENOUS

## 2017-02-17 MED ORDER — MEPERIDINE HCL 100 MG/ML IJ SOLN
INTRAMUSCULAR | Status: DC | PRN
  Administered 2017-02-17: 50 mg via INTRAVENOUS

## 2017-02-17 MED ORDER — ONDANSETRON HCL 4 MG/2ML IJ SOLN
INTRAMUSCULAR | Status: AC
  Filled 2017-02-17: qty 2

## 2017-02-17 MED ORDER — MIDAZOLAM HCL 5 MG/5ML IJ SOLN
INTRAMUSCULAR | Status: DC | PRN
  Administered 2017-02-17: 2 mg via INTRAVENOUS
  Administered 2017-02-17: 1 mg via INTRAVENOUS

## 2017-02-17 NOTE — Op Note (Signed)
[facility] Patient Name: Tracy Knight Procedure Date: 02/17/2017 9:41 AM MRN: [id number] Date of Birth: 04/02/1961 Attending MD: Norvel Richards , MD CSN: [id number] Age: 56 Admit Type: Outpatient Procedure:                Colonoscopy Indications:              High risk colon cancer surveillance: Personal                            history of colonic polyps Providers:                Norvel Richards, MD, Otis Peak B. Gwenlyn Perking RN, RN,                            Aram Candela Referring MD:              Medicines:                Midazolam 3 mg IV, Meperidine 50 mg IV, Ondansetron                            4 mg IV Complications:            No immediate complications. Estimated Blood Loss:     Estimated blood loss was minimal. Procedure:                Pre-Anesthesia Assessment:                           - Prior to the procedure, a History and Physical                            was performed, and patient medications and                            allergies were reviewed. The patient's tolerance of                            previous anesthesia was also reviewed. The risks                            and benefits of the procedure and the sedation                            options and risks were discussed with the patient.                            All questions were answered, and informed consent                            was obtained. Prior Anticoagulants: The patient has                            taken no previous anticoagulant or antiplatelet  agents. ASA Grade Assessment: II - A patient with                            mild systemic disease. After reviewing the risks                            and benefits, the patient was deemed in                            satisfactory condition to undergo the procedure.                           After obtaining informed consent, the colonoscope                            was passed under direct vision. Throughout  the                            procedure, the patient's blood pressure, pulse, and                            oxygen saturations were monitored continuously. The                            EC-[id number] ([id number]) scope was introduced through                            the anus and advanced to the the cecum, identified                            by appendiceal orifice and ileocecal valve. The                            ileocecal valve, appendiceal orifice, and rectum                            were photographed. The entire colon was well                            visualized. The quality of the bowel preparation                            was adequate. The ileocecal valve, appendiceal                            orifice, and rectum were photographed. The entire                            colon was well visualized. Scope In: 10:02:51 AM Scope Out: 10:16:28 AM Scope Withdrawal Time: 0 hours 8 minutes 31 seconds  Total Procedure Duration: 0 hours 13 minutes 37 seconds  Findings:      The perianal and digital rectal examinations were normal.      A 4 mm polyp was found in  the descending colon. The polyp was sessile.       The polyp was removed with a cold snare. Resection and retrieval were       complete. Estimated blood loss was minimal.      The exam was otherwise without abnormality on direct and retroflexion       views. Impression:               - One 4 mm polyp in the descending colon, removed                            with a cold snare. Resected and retrieved.                           - The examination was otherwise normal on direct                            and retroflexion views. Moderate Sedation:      Moderate (conscious) sedation was administered by the endoscopy nurse       and supervised by the endoscopist. The following parameters were       monitored: oxygen saturation, heart rate, blood pressure, respiratory       rate, EKG, adequacy of pulmonary ventilation, and response to  care.       Total physician intraservice time was 17 minutes. Recommendation:           - Patient has a contact number available for                            emergencies. The signs and symptoms of potential                            delayed complications were discussed with the                            patient. Return to normal activities tomorrow.                            Written discharge instructions were provided to the                            patient.                           - Resume previous diet.                           - Continue present medications.                           - Repeat colonoscopy date to be determined after                            pending pathology results are reviewed for                            surveillance based on pathology results.                           -  Return to GI clinic (date not yet determined). Procedure Code(s):        --- Professional ---                           [phone], Colonoscopy, flexible; with removal of                            tumor(s), polyp(s), or other lesion(s) by snare                            technique                           99152, Moderate sedation services provided by the                            same physician or other qualified health care                            professional performing the diagnostic or                            therapeutic service that the sedation supports,                            requiring the presence of an independent trained                            observer to assist in the monitoring of the                            patient's level of consciousness and physiological                            status; initial 15 minutes of intraservice time,                            patient age 31 years or older Diagnosis Code(s):        --- Professional ---                           Z86.010, Personal history of colonic polyps                           D12.4, Benign neoplasm of  descending colon CPT copyright 2016 American Medical Association. All rights reserved. The codes documented in this report are preliminary and upon coder review may  be revised to meet current compliance requirements. Cristopher Estimable. Hennesy Sobalvarro, MD Norvel Richards, MD 02/17/2017 10:21:40 AM This report has been signed electronically. Number of Addenda: 0

## 2017-02-17 NOTE — H&P (Signed)
@LOGO @   Primary Care Physician:  Sharilyn Sites, MD Primary Gastroenterologist:  Dr. Gala Romney  Pre-Procedure History & Physical: HPI:  Tracy Knight is a 56 y.o. female here for a surveillance colonoscopy. History of colonic adenomas removed 2011. No bowel symptoms currently.  Past Medical History:  Diagnosis Date  . Asthma   . Diabetes mellitus without complication ([place])   . Dyslipidemia   . History of nuclear stress test 02/2009   exercise; normal pattern of perfusion; no significant ischemia demonstrated; low risk scan   . Hypertension     Past Surgical History:  Procedure Laterality Date  . CARDIAC CATHETERIZATION  07/2003   no evidence of CAD  . removal cyst on chin    . TRANSTHORACIC ECHOCARDIOGRAM  03/2007   EF=>55%; borderline LA enlargement; MV mildly thickened/borderline MVP/mild MR    Prior to Admission medications   Medication Sig Start Date End Date Taking? Authorizing Provider  cetirizine (ZYRTEC) 10 MG tablet Take 10 mg by mouth daily.   Yes [provider]  cholecalciferol (VITAMIN D) 1000 UNITS tablet Take 1,000 Units by mouth 3 (three) times a week.    Yes [provider]  Cyanocobalamin (VITAMIN B-12) 2500 MCG SUBL Place 2,500 mcg under the tongue daily.   Yes [provider]  diltiazem (TIAZAC) 360 MG 24 hr capsule TAKE 1 CAPSULE BY MOUTH DAILY 06/19/16  Yes Croitoru, Mihai, MD  Ferrous Sulfate Dried (SLOW RELEASE IRON) 45 MG TBCR Take 45 mg by mouth daily.   Yes [provider]  fish oil-omega-3 fatty acids 1000 MG capsule Take 1,000 mg by mouth daily.    Yes [provider]  guaiFENesin (MUCINEX) 600 MG 12 hr tablet Take 600 mg by mouth daily as needed (for congestion/cough).   Yes [provider]  ibuprofen (ADVIL,MOTRIN) 200 MG tablet Take 200-400 mg by mouth every 8 (eight) hours as needed (for pain/headache.).   Yes [provider]  Ipratropium-Albuterol (COMBIVENT RESPIMAT) 20-100 MCG/ACT AERS  respimat Inhale 2 puffs into the lungs 2 (two) times daily as needed for wheezing.   Yes [provider]  levalbuterol (XOPENEX) 1.25 MG/3ML nebulizer solution Inhale 1.25 mg into the lungs every 6 (six) hours as needed. For wheezing/shortness of breath 11/18/16  Yes [provider]  losartan (COZAAR) 50 MG tablet TAKE 1 TABLET(50 MG) BY MOUTH DAILY Patient taking differently: TAKE 1 TABLET(50 MG) BY MOUTH DAILY IN THE EVENING 04/08/16  Yes Croitoru, Mihai, MD  metFORMIN (GLUCOPHAGE) 1000 MG tablet Take 1,000 mg by mouth daily before lunch.   Yes [provider]  montelukast (SINGULAIR) 10 MG tablet Take 10 mg by mouth daily before lunch.    Yes [provider]  polyethylene glycol-electrolytes (TRILYTE) 420 g solution Take 4,000 mLs by mouth as directed. 12/17/16  Yes Caelum Federici, Cristopher Estimable, MD  pravastatin (PRAVACHOL) 40 MG tablet Take 1 tablet (40 mg total) by mouth every evening. 03/13/16 02/12/18 Yes Croitoru, Mihai, MD  triamterene-hydrochlorothiazide (MAXZIDE-25) 37.5-25 MG tablet Take 1 tablet by mouth daily.   Yes [provider]    Allergies as of 12/17/2016  . (No Known Allergies)    Family History  Problem Relation Age of Onset  . Heart disease Mother   . Heart disease Father     Social History   Social History  . Marital status: Married    Spouse name: N/A  . Number of children: N/A  . Years of education: N/A   Occupational History  . Not  on file.   Social History Main Topics  . Smoking status: Never Smoker  . Smokeless tobacco: Never Used     Comment: never smoke.  Marland Kitchen Alcohol use No  . Drug use: No  . Sexual activity: Yes   Other Topics Concern  . Not on file   Social History Narrative  . No narrative on file    Review of Systems: See HPI, otherwise negative ROS  Physical Exam: BP 140/80   Pulse 72   Temp 97.9 F (36.6 C) (Oral)   Resp 17   Ht 5\' 5"  (1.651 m)   Wt 207 lb (93.9 kg)   SpO2 99%   BMI 34.45 kg/m   General:   Alert,  Well-developed, well-nourished, pleasant and cooperative in NAD Mouth:  No deformity or lesions. Neck:  Supple; no masses or thyromegaly. No significant cervical adenopathy. Lungs:  Clear throughout to auscultation.   No wheezes, crackles, or rhonchi. No acute distress. Heart:  Regular rate and rhythm; no murmurs, clicks, rubs,  or gallops. Abdomen: Non-distended, normal bowel sounds.  Soft and nontender without appreciable mass or hepatosplenomegaly.  Pulses:  Normal pulses noted. Extremities:  Without clubbing or edema.  Impression:  Pleasant 56 year old lady with history of colonic adenomas; no GI symptoms currently. Here for surveillance examination  Recommendations:  I have offered the patient a surveillance colonoscopy per plan.   The risks, benefits, limitations, alternatives and imponderables have been reviewed with the patient. Questions have been answered. All parties are agreeable.                                               Notice: This dictation was prepared with Dragon dictation along with smaller phrase technology. Any transcriptional errors that result from this process are unintentional and may not be corrected upon review.

## 2017-02-17 NOTE — Discharge Instructions (Addendum)
°Colonoscopy °Discharge Instructions ° °Read the instructions outlined below and refer to this sheet in the next few weeks. These discharge instructions provide you with general information on caring for yourself after you leave the hospital. Your doctor may also give you specific instructions. While your treatment has been planned according to the most current medical practices available, unavoidable complications occasionally occur. If you have any problems or questions after discharge, call Dr. Rourk at [phone]. °ACTIVITY °· You may resume your regular activity, but move at a slower pace for the next 24 hours.  °· Take frequent rest periods for the next 24 hours.  °· Walking will help get rid of the air and reduce the bloated feeling in your belly (abdomen).  °· No driving for 24 hours (because of the medicine (anesthesia) used during the test).   °· Do not sign any important legal documents or operate any machinery for 24 hours (because of the anesthesia used during the test).  °NUTRITION °· Drink plenty of fluids.  °· You may resume your normal diet as instructed by your doctor.  °· Begin with a light meal and progress to your normal diet. Heavy or fried foods are harder to digest and may make you feel sick to your stomach (nauseated).  °· Avoid alcoholic beverages for 24 hours or as instructed.  °MEDICATIONS °· You may resume your normal medications unless your doctor tells you otherwise.  °WHAT YOU CAN EXPECT TODAY °· Some feelings of bloating in the abdomen.  °· Passage of more gas than usual.  °· Spotting of blood in your stool or on the toilet paper.  °IF YOU HAD POLYPS REMOVED DURING THE COLONOSCOPY: °· No aspirin products for 7 days or as instructed.  °· No alcohol for 7 days or as instructed.  °· Eat a soft diet for the next 24 hours.  °FINDING OUT THE RESULTS OF YOUR TEST °Not all test results are available during your visit. If your test results are not back during the visit, make an appointment  with your caregiver to find out the results. Do not assume everything is normal if you have not heard from your caregiver or the medical facility. It is important for you to follow up on all of your test results.  °SEEK IMMEDIATE MEDICAL ATTENTION IF: °· You have more than a spotting of blood in your stool.  °· Your belly is swollen (abdominal distention).  °· You are nauseated or vomiting.  °· You have a temperature over 101.  °· You have abdominal pain or discomfort that is severe or gets worse throughout the day.  ° °Colon Polyps °Polyps are tissue growths inside the body. Polyps can grow in many places, including the large intestine (colon). A polyp may be a round bump or a mushroom-shaped growth. You could have one polyp or several. °Most colon polyps are noncancerous (benign). However, some colon polyps can become cancerous over time. °What are the causes? °The exact cause of colon polyps is not known. °What increases the risk? °This condition is more likely to develop in people who: °· Have a family history of colon cancer or colon polyps. °· Are older than 50 or older than 45 if they are African American. °· Have inflammatory bowel disease, such as ulcerative colitis or Crohn disease. °· Are overweight. °· Smoke cigarettes. °· Do not get enough exercise. °· Drink too much alcohol. °· Eat a diet that is: °? High in fat and red meat. °? Low in fiber. °·   Had childhood cancer that was treated with abdominal radiation. ° °What are the signs or symptoms? °Most polyps do not cause symptoms. If you have symptoms, they may include: °· Blood coming from your rectum when having a bowel movement. °· Blood in your stool. The stool may look dark red or black. °· A change in bowel habits, such as constipation or diarrhea. ° °How is this diagnosed? °This condition is diagnosed with a colonoscopy. This is a procedure that uses a lighted, flexible scope to look at the inside of your colon. °How is this treated? °Treatment for  this condition involves removing any polyps that are found. Those polyps will then be tested for cancer. If cancer is found, your health care provider will talk to you about options for colon cancer treatment. °Follow these instructions at home: °Diet °· Eat plenty of fiber, such as fruits, vegetables, and whole grains. °· Eat foods that are high in calcium and vitamin D, such as milk, cheese, yogurt, eggs, liver, fish, and broccoli. °· Limit foods high in fat, red meats, and processed meats, such as hot dogs, sausage, bacon, and lunch meats. °· Maintain a healthy weight, or lose weight if recommended by your health care provider. °General instructions °· Do not smoke cigarettes. °· Do not drink alcohol excessively. °· Keep all follow-up visits as told by your health care provider. This is important. This includes keeping regularly scheduled colonoscopies. Talk to your health care provider about when you need a colonoscopy. °· Exercise every day or as told by your health care provider. °Contact a health care provider if: °· You have new or worsening bleeding during a bowel movement. °· You have new or increased blood in your stool. °· You have a change in bowel habits. °· You unexpectedly lose weight. °This information is not intended to replace advice given to you by your health care provider. Make sure you discuss any questions you have with your health care provider. °Document Released: 03/12/2004 Document Revised: 11/22/2015 Document Reviewed: 05/07/2015 °Elsevier Interactive Patient Education © 2018 [employer] ° ° ° °Colon polyp information provided ° °Further recommendations to follow pending review of pathology report °

## 2017-02-18 ENCOUNTER — Encounter: Payer: Self-pay | Admitting: Internal Medicine

## 2017-02-19 ENCOUNTER — Encounter (HOSPITAL_COMMUNITY): Payer: Self-pay | Admitting: Internal Medicine

## 2017-03-31 ENCOUNTER — Other Ambulatory Visit: Payer: Self-pay | Admitting: Cardiovascular Disease

## 2017-04-16 ENCOUNTER — Encounter: Payer: Self-pay | Admitting: Cardiology

## 2017-04-16 ENCOUNTER — Ambulatory Visit (INDEPENDENT_AMBULATORY_CARE_PROVIDER_SITE_OTHER): Payer: BLUE CROSS/BLUE SHIELD | Admitting: Cardiology

## 2017-04-16 VITALS — BP 140/88 | HR 75 | Ht 65.0 in | Wt 207.0 lb

## 2017-04-16 DIAGNOSIS — E668 Other obesity: Secondary | ICD-10-CM | POA: Diagnosis not present

## 2017-04-16 DIAGNOSIS — E119 Type 2 diabetes mellitus without complications: Secondary | ICD-10-CM

## 2017-04-16 DIAGNOSIS — R002 Palpitations: Secondary | ICD-10-CM | POA: Diagnosis not present

## 2017-04-16 DIAGNOSIS — E782 Mixed hyperlipidemia: Secondary | ICD-10-CM | POA: Diagnosis not present

## 2017-04-16 DIAGNOSIS — I10 Essential (primary) hypertension: Secondary | ICD-10-CM | POA: Diagnosis not present

## 2017-04-16 DIAGNOSIS — E669 Obesity, unspecified: Secondary | ICD-10-CM

## 2017-04-16 NOTE — Assessment & Plan Note (Signed)
Currently not an issue 

## 2017-04-16 NOTE — Patient Instructions (Signed)
Medication Instructions:  Your physician recommends that you continue on your current medications as directed. Please refer to the Current Medication list given to you today.  Labwork: None   Testing/Procedures: none  Follow-Up: Your physician wants you to follow-up in: 12 months with Dr Sallyanne Kuster. You will receive a reminder letter in the mail two months in advance. If you don't receive a letter, please call our office to schedule the follow-up appointment.  Any Other Special Instructions Will Be Listed Below (If Applicable).  Monitor your blood ptressure 3 times a week until your next appointment with Dr Armandina Gemma then give him your readings  If you need a refill on your cardiac medications before your next appointment, please call your pharmacy.

## 2017-04-16 NOTE — Assessment & Plan Note (Signed)
BMI 34 

## 2017-04-16 NOTE — Assessment & Plan Note (Signed)
On statin-managed by PCP

## 2017-04-16 NOTE — Assessment & Plan Note (Signed)
Her B/P is running a little high- 138/80 by me. I asked her to keep a log and report to Dr Hilma Favors when she sees him in a month or so.

## 2017-04-16 NOTE — Assessment & Plan Note (Signed)
NIDDM managed by PCP

## 2017-04-16 NOTE — Progress Notes (Signed)
04/16/2017 Tracy Knight   1960-10-12  [id number]  Primary Physician Sharilyn Sites, MD Primary Cardiologist: Dr Sallyanne Kuster  HPI:  56 y/o female followed by Dr Sallyanne Kuster with a history of palpitations, DM, HTN, HLD, and remote normal coronaries. She works second shift. She is in the office for a routine follow up. She says she has been doing well, no palpitations or medical issues except a colonoscopy 02/17/17 with Dr Gala Romney which the pt tells me was OK.    Current Outpatient Prescriptions  Medication Sig Dispense Refill  . cetirizine (ZYRTEC) 10 MG tablet Take 10 mg by mouth daily.    . cholecalciferol (VITAMIN D) 1000 UNITS tablet Take 1,000 Units by mouth 3 (three) times a week.     . Cyanocobalamin (VITAMIN B-12) 2500 MCG SUBL Place 2,500 mcg under the tongue daily.    Marland Kitchen diltiazem (TIAZAC) 360 MG 24 hr capsule TAKE 1 CAPSULE BY MOUTH DAILY 90 capsule 2  . Ferrous Sulfate Dried (SLOW RELEASE IRON) 45 MG TBCR Take 45 mg by mouth daily.    . fish oil-omega-3 fatty acids 1000 MG capsule Take 1,000 mg by mouth daily.     Marland Kitchen guaiFENesin (MUCINEX) 600 MG 12 hr tablet Take 600 mg by mouth daily as needed (for congestion/cough).    Marland Kitchen ibuprofen (ADVIL,MOTRIN) 200 MG tablet Take 200-400 mg by mouth every 8 (eight) hours as needed (for pain/headache.).    Marland Kitchen Ipratropium-Albuterol (COMBIVENT RESPIMAT) 20-100 MCG/ACT AERS respimat Inhale 2 puffs into the lungs 2 (two) times daily as needed for wheezing.    . levalbuterol (XOPENEX) 1.25 MG/3ML nebulizer solution Inhale 1.25 mg into the lungs every 6 (six) hours as needed. For wheezing/shortness of breath  3  . losartan (COZAAR) 50 MG tablet TAKE 1 TABLET(50 MG) BY MOUTH DAILY (Patient taking differently: TAKE 1 TABLET(50 MG) BY MOUTH DAILY IN THE EVENING) 90 tablet 3  . metFORMIN (GLUCOPHAGE) 1000 MG tablet Take 1,000 mg by mouth daily before lunch.    . montelukast (SINGULAIR) 10 MG tablet Take 10 mg by mouth daily before lunch.     . pravastatin  (PRAVACHOL) 40 MG tablet TAKE 1 TABLET(40 MG) BY MOUTH EVERY EVENING 90 tablet 0  . triamterene-hydrochlorothiazide (MAXZIDE-25) 37.5-25 MG tablet Take 1 tablet by mouth daily.     No current facility-administered medications for this visit.     No Known Allergies  Past Medical History:  Diagnosis Date  . Asthma   . Diabetes mellitus without complication ([place])   . Dyslipidemia   . History of nuclear stress test 02/2009   exercise; normal pattern of perfusion; no significant ischemia demonstrated; low risk scan   . Hypertension     Social History   Social History  . Marital status: Married    Spouse name: N/A  . Number of children: N/A  . Years of education: N/A   Occupational History  . Not on file.   Social History Main Topics  . Smoking status: Never Smoker  . Smokeless tobacco: Never Used     Comment: never smoke.  Marland Kitchen Alcohol use No  . Drug use: No  . Sexual activity: Yes   Other Topics Concern  . Not on file   Social History Narrative  . No narrative on file     Family History  Problem Relation Age of Onset  . Heart disease Mother   . Heart disease Father      Review of Systems: General: negative for chills, fever, night  sweats or weight changes.  Cardiovascular: negative for chest pain, dyspnea on exertion, edema, orthopnea, palpitations, paroxysmal nocturnal dyspnea or shortness of breath Dermatological: negative for rash Respiratory: negative for cough or wheezing Urologic: negative for hematuria Abdominal: negative for nausea, vomiting, diarrhea, bright red blood per rectum, melena, or hematemesis Neurologic: negative for visual changes, syncope, or dizziness All other systems reviewed and are otherwise negative except as noted above.    Blood pressure 140/88, pulse 75, height 5\' 5"  (1.651 m), weight 207 lb (93.9 kg).  General appearance: alert, cooperative, no distress and mildly obese Neck: no carotid bruit and no JVD Lungs: clear to  auscultation bilaterally Heart: regular rate and rhythm Extremities: extremities normal, atraumatic, no cyanosis or edema Skin: Skin color, texture, turgor normal. No rashes or lesions Neurologic: Grossly normal  EKG NSR  ASSESSMENT AND PLAN:   Palpitations Currently not an issue  Hypertension Her B/P is running a little high- 138/80 by me. I asked her to keep a log and report to Dr Hilma Favors when she sees him in a month or so.   Non-insulin treated type 2 diabetes mellitus ([facility]CC) NIDDM managed by PCP  Hyperlipidemia, mixed On statin-managed by PCP  Moderate obesity BMI 34  PLAN  As above, monitor B/P. F/U in one year with Dr Sallyanne Kuster.   Kerin Ransom PA-C 04/16/2017 10:38 AM

## 2017-06-09 DIAGNOSIS — E6609 Other obesity due to excess calories: Secondary | ICD-10-CM | POA: Diagnosis not present

## 2017-06-09 DIAGNOSIS — Z6834 Body mass index (BMI) 34.0-34.9, adult: Secondary | ICD-10-CM | POA: Diagnosis not present

## 2017-06-09 DIAGNOSIS — E119 Type 2 diabetes mellitus without complications: Secondary | ICD-10-CM | POA: Diagnosis not present

## 2017-06-09 DIAGNOSIS — J4521 Mild intermittent asthma with (acute) exacerbation: Secondary | ICD-10-CM | POA: Diagnosis not present

## 2017-06-09 DIAGNOSIS — Z1389 Encounter for screening for other disorder: Secondary | ICD-10-CM | POA: Diagnosis not present

## 2017-07-02 ENCOUNTER — Other Ambulatory Visit: Payer: Self-pay | Admitting: Cardiovascular Disease

## 2017-07-03 ENCOUNTER — Other Ambulatory Visit: Payer: Self-pay | Admitting: Cardiovascular Disease

## 2017-08-11 ENCOUNTER — Encounter: Payer: Self-pay | Admitting: Obstetrics & Gynecology

## 2017-08-11 ENCOUNTER — Other Ambulatory Visit: Payer: Self-pay

## 2017-08-11 ENCOUNTER — Ambulatory Visit (INDEPENDENT_AMBULATORY_CARE_PROVIDER_SITE_OTHER): Payer: BLUE CROSS/BLUE SHIELD | Admitting: Obstetrics & Gynecology

## 2017-08-11 ENCOUNTER — Other Ambulatory Visit (HOSPITAL_COMMUNITY)
Admission: RE | Admit: 2017-08-11 | Discharge: 2017-08-11 | Disposition: A | Discharge status: Home / Self Care | Payer: BLUE CROSS/BLUE SHIELD | Source: Ambulatory Visit | Attending: Obstetrics & Gynecology | Admitting: Obstetrics & Gynecology

## 2017-08-11 VITALS — BP 134/82 | HR 83 | Resp 18 | Ht 65.0 in | Wt 203.0 lb

## 2017-08-11 DIAGNOSIS — Z1212 Encounter for screening for malignant neoplasm of rectum: Secondary | ICD-10-CM | POA: Diagnosis not present

## 2017-08-11 DIAGNOSIS — Z1211 Encounter for screening for malignant neoplasm of colon: Secondary | ICD-10-CM

## 2017-08-11 DIAGNOSIS — Z01419 Encounter for gynecological examination (general) (routine) without abnormal findings: Secondary | ICD-10-CM | POA: Insufficient documentation

## 2017-08-11 LAB — HEMOCCULT GUIAC POC 1CARD (OFFICE): FECAL OCCULT BLD: NEGATIVE

## 2017-08-11 NOTE — Progress Notes (Signed)
Subjective:     Tracy Knight is a 57 y.o. female here for a routine exam.  No LMP recorded. Patient is postmenopausal. G3P3 Birth Control Method:  Post menopausal Menstrual Calendar(currently): amenorrhea  Current complaints: none.   Current acute medical issues:    Recent Gynecologic History No LMP recorded. Patient is postmenopausal. Last Pap: ,  normal Last mammogram: 2018,  normal  Past Medical History:  Diagnosis Date  . Asthma   . Diabetes mellitus without complication ([place])   . Dyslipidemia   . History of nuclear stress test 02/2009   exercise; normal pattern of perfusion; no significant ischemia demonstrated; low risk scan   . Hypertension     Past Surgical History:  Procedure Laterality Date  . CARDIAC CATHETERIZATION  07/2003   no evidence of CAD  . COLONOSCOPY N/A 02/17/2017   Procedure: COLONOSCOPY;  Surgeon: Daneil Dolin, MD;  Location: [facility];  Service: Endoscopy;  Laterality: N/A;  10:00am  . POLYPECTOMY  02/17/2017   Procedure: POLYPECTOMY;  Surgeon: Daneil Dolin, MD;  Location: [facility];  Service: Endoscopy;;  colon  . removal cyst on chin    . TRANSTHORACIC ECHOCARDIOGRAM  03/2007   EF=>55%; borderline LA enlargement; MV mildly thickened/borderline MVP/mild MR    OB History    Gravida  3   Para  3   Term      Preterm      AB      Living  3     SAB      TAB      Ectopic      Multiple      Live Births              Social History   Socioeconomic History  . Marital status: Married    Spouse name: Not on file  . Number of children: Not on file  . Years of education: Not on file  . Highest education level: Not on file  Occupational History  . Not on file  Social Needs  . Financial resource strain: Not on file  . Food insecurity:    Worry: Not on file    Inability: Not on file  . Transportation needs:    Medical: Not on file    Non-medical: Not on file  Tobacco Use  . Smoking status: Never Smoker  .  Smokeless tobacco: Never Used  . Tobacco comment: never smoke.  Substance and Sexual Activity  . Alcohol use: No  . Drug use: No  . Sexual activity: Yes    Birth control/protection: Post-menopausal  Lifestyle  . Physical activity:    Days per week: Not on file    Minutes per session: Not on file  . Stress: Not on file  Relationships  . Social connections:    Talks on phone: Not on file    Gets together: Not on file    Attends religious service: Not on file    Active member of club or organization: Not on file    Attends meetings of clubs or organizations: Not on file    Relationship status: Not on file  Other Topics Concern  . Not on file  Social History Narrative  . Not on file    Family History  Problem Relation Age of Onset  . Heart disease Mother   . Heart disease Father   . Hypertension Sister   . Hypertension Daughter   . Diabetes Daughter      Current  Outpatient Medications:  .  cetirizine (ZYRTEC) 10 MG tablet, Take 10 mg by mouth daily., Disp: , Rfl:  .  cholecalciferol (VITAMIN D) 1000 UNITS tablet, Take 1,000 Units by mouth 3 (three) times a week. , Disp: , Rfl:  .  Cyanocobalamin (VITAMIN B-12) 2500 MCG SUBL, Place 2,500 mcg under the tongue daily., Disp: , Rfl:  .  diltiazem (TIAZAC) 360 MG 24 hr capsule, TAKE 1 CAPSULE BY MOUTH DAILY, Disp: 90 capsule, Rfl: 2 .  Ferrous Sulfate Dried (SLOW RELEASE IRON) 45 MG TBCR, Take 45 mg by mouth daily., Disp: , Rfl:  .  fish oil-omega-3 fatty acids 1000 MG capsule, Take 1,000 mg by mouth daily. , Disp: , Rfl:  .  guaiFENesin (MUCINEX) 600 MG 12 hr tablet, Take 600 mg by mouth daily as needed (for congestion/cough)., Disp: , Rfl:  .  ibuprofen (ADVIL,MOTRIN) 200 MG tablet, Take 200-400 mg by mouth every 8 (eight) hours as needed (for pain/headache.)., Disp: , Rfl:  .  levalbuterol (XOPENEX) 1.25 MG/3ML nebulizer solution, Inhale 1.25 mg into the lungs every 6 (six) hours as needed. For wheezing/shortness of breath,  Disp: , Rfl: 3 .  losartan (COZAAR) 50 MG tablet, TAKE 1 TABLET(50 MG) BY MOUTH DAILY, Disp: 90 tablet, Rfl: 0 .  metFORMIN (GLUCOPHAGE) 1000 MG tablet, Take 1,000 mg by mouth daily before lunch., Disp: , Rfl:  .  montelukast (SINGULAIR) 10 MG tablet, Take 10 mg by mouth daily before lunch. , Disp: , Rfl:  .  PROAIR HFA 108 (90 Base) MCG/ACT inhaler, INHALE 2 PUFFS INTO THE LUNGS Q 4 H PRN, Disp: , Rfl: 2 .  triamterene-hydrochlorothiazide (MAXZIDE-25) 37.5-25 MG tablet, Take 1 tablet by mouth daily., Disp: , Rfl:  .  pravastatin (PRAVACHOL) 40 MG tablet, Take 1 tablet (40 mg total) by mouth daily., Disp: 90 tablet, Rfl: 0  Review of Systems  Review of Systems  Constitutional: Negative for fever, chills, weight loss, malaise/fatigue and diaphoresis.  HENT: Negative for hearing loss, ear pain, nosebleeds, congestion, sore throat, neck pain, tinnitus and ear discharge.   Eyes: Negative for blurred vision, double vision, photophobia, pain, discharge and redness.  Respiratory: Negative for cough, hemoptysis, sputum production, shortness of breath, wheezing and stridor.   Cardiovascular: Negative for chest pain, palpitations, orthopnea, claudication, leg swelling and PND.  Gastrointestinal: negative for abdominal pain. Negative for heartburn, nausea, vomiting, diarrhea, constipation, blood in stool and melena.  Genitourinary: Negative for dysuria, urgency, frequency, hematuria and flank pain.  Musculoskeletal: Negative for myalgias, back pain, joint pain and falls.  Skin: Negative for itching and rash.  Neurological: Negative for dizziness, tingling, tremors, sensory change, speech change, focal weakness, seizures, loss of consciousness, weakness and headaches.  Endo/Heme/Allergies: Negative for environmental allergies and polydipsia. Does not bruise/bleed easily.  Psychiatric/Behavioral: Negative for depression, suicidal ideas, hallucinations, memory loss and substance abuse. The patient is not  nervous/anxious and does not have insomnia.        Objective:  Blood pressure 134/82, pulse 83, resp. rate 18, height 5\' 5"  (1.651 m), weight 203 lb (92.1 kg).   Physical Exam  Vitals reviewed. Constitutional: She is oriented to person, place, and time. She appears well-developed and well-nourished.  HENT:  Head: Normocephalic and atraumatic.        Right Ear: External ear normal.  Left Ear: External ear normal.  Nose: Nose normal.  Mouth/Throat: Oropharynx is clear and moist.  Eyes: Conjunctivae and EOM are normal. Pupils are equal, round, and reactive to light. Right  eye exhibits no discharge. Left eye exhibits no discharge. No scleral icterus.  Neck: Normal range of motion. Neck supple. No tracheal deviation present. No thyromegaly present.  Cardiovascular: Normal rate, regular rhythm, normal heart sounds and intact distal pulses.  Exam reveals no gallop and no friction rub.   No murmur heard. Respiratory: Effort normal and breath sounds normal. No respiratory distress. She has no wheezes. She has no rales. She exhibits no tenderness.  GI: Soft. Bowel sounds are normal. She exhibits no distension and no mass. There is no tenderness. There is no rebound and no guarding.  Genitourinary:  Breasts no masses skin changes or nipple changes bilaterally      Vulva is normal without lesions Vagina is pink moist without discharge Cervix normal in appearance and pap is done Uterus is normal size shape and contour Adnexa is negative with normal sized ovaries  Musculoskeletal: Normal range of motion. She exhibits no edema and no tenderness.  Neurological: She is alert and oriented to person, place, and time. She has normal reflexes. She displays normal reflexes. No cranial nerve deficit. She exhibits normal muscle tone. Coordination normal.  Skin: Skin is warm and dry. No rash noted. No erythema. No pallor.  Psychiatric: She has a normal mood and affect. Her behavior is normal. Judgment and  thought content normal.       Medications Ordered at today's visit: No orders of the defined types were placed in this encounter.   Other orders placed at today's visit: Orders Placed This Encounter  Procedures  . POCT occult blood stool      Assessment:    Healthy female exam.    Plan:    Mammogram ordered. Follow up in: 2 years.     Return in about 1 year (around 08/11/2018) for yearly, with Dr Elonda Husky.

## 2017-08-12 LAB — CYTOLOGY - PAP
Diagnosis: NEGATIVE
HPV (WINDOPATH): NOT DETECTED

## 2017-09-28 ENCOUNTER — Other Ambulatory Visit (HOSPITAL_COMMUNITY): Payer: Self-pay | Admitting: Family Medicine

## 2017-09-28 DIAGNOSIS — Z1239 Encounter for other screening for malignant neoplasm of breast: Secondary | ICD-10-CM

## 2017-10-02 ENCOUNTER — Ambulatory Visit (HOSPITAL_COMMUNITY)
Admission: RE | Admit: 2017-10-02 | Discharge: 2017-10-02 | Disposition: A | Discharge status: Home / Self Care | Payer: BLUE CROSS/BLUE SHIELD | Source: Ambulatory Visit | Attending: Family Medicine | Admitting: Family Medicine

## 2017-10-02 DIAGNOSIS — Z1231 Encounter for screening mammogram for malignant neoplasm of breast: Secondary | ICD-10-CM | POA: Insufficient documentation

## 2017-10-02 DIAGNOSIS — Z1239 Encounter for other screening for malignant neoplasm of breast: Secondary | ICD-10-CM

## 2017-10-04 ENCOUNTER — Other Ambulatory Visit: Payer: Self-pay | Admitting: Cardiovascular Disease

## 2017-11-09 DIAGNOSIS — E782 Mixed hyperlipidemia: Secondary | ICD-10-CM | POA: Diagnosis not present

## 2017-11-09 DIAGNOSIS — J45909 Unspecified asthma, uncomplicated: Secondary | ICD-10-CM | POA: Diagnosis not present

## 2017-11-09 DIAGNOSIS — I10 Essential (primary) hypertension: Secondary | ICD-10-CM | POA: Diagnosis not present

## 2017-11-09 DIAGNOSIS — E6609 Other obesity due to excess calories: Secondary | ICD-10-CM | POA: Diagnosis not present

## 2017-11-09 DIAGNOSIS — Z1389 Encounter for screening for other disorder: Secondary | ICD-10-CM | POA: Diagnosis not present

## 2017-11-09 DIAGNOSIS — E119 Type 2 diabetes mellitus without complications: Secondary | ICD-10-CM | POA: Diagnosis not present

## 2017-11-09 DIAGNOSIS — Z6834 Body mass index (BMI) 34.0-34.9, adult: Secondary | ICD-10-CM | POA: Diagnosis not present

## 2017-11-09 DIAGNOSIS — E785 Hyperlipidemia, unspecified: Secondary | ICD-10-CM | POA: Diagnosis not present

## 2018-01-01 ENCOUNTER — Other Ambulatory Visit: Payer: Self-pay | Admitting: Cardiovascular Disease

## 2018-02-19 DIAGNOSIS — E11319 Type 2 diabetes mellitus with unspecified diabetic retinopathy without macular edema: Secondary | ICD-10-CM | POA: Diagnosis not present

## 2018-03-15 DIAGNOSIS — J45909 Unspecified asthma, uncomplicated: Secondary | ICD-10-CM | POA: Diagnosis not present

## 2018-03-15 DIAGNOSIS — Z1389 Encounter for screening for other disorder: Secondary | ICD-10-CM | POA: Diagnosis not present

## 2018-03-15 DIAGNOSIS — Z6834 Body mass index (BMI) 34.0-34.9, adult: Secondary | ICD-10-CM | POA: Diagnosis not present

## 2018-03-15 DIAGNOSIS — E1165 Type 2 diabetes mellitus with hyperglycemia: Secondary | ICD-10-CM | POA: Diagnosis not present

## 2018-03-15 DIAGNOSIS — I10 Essential (primary) hypertension: Secondary | ICD-10-CM | POA: Diagnosis not present

## 2018-03-15 DIAGNOSIS — Z23 Encounter for immunization: Secondary | ICD-10-CM | POA: Diagnosis not present

## 2018-03-15 DIAGNOSIS — J4520 Mild intermittent asthma, uncomplicated: Secondary | ICD-10-CM | POA: Diagnosis not present

## 2018-03-15 DIAGNOSIS — E782 Mixed hyperlipidemia: Secondary | ICD-10-CM | POA: Diagnosis not present

## 2018-03-15 DIAGNOSIS — H6121 Impacted cerumen, right ear: Secondary | ICD-10-CM | POA: Diagnosis not present

## 2018-03-15 DIAGNOSIS — E6609 Other obesity due to excess calories: Secondary | ICD-10-CM | POA: Diagnosis not present

## 2018-04-01 ENCOUNTER — Other Ambulatory Visit: Payer: Self-pay | Admitting: Cardiovascular Disease

## 2018-05-07 ENCOUNTER — Ambulatory Visit (INDEPENDENT_AMBULATORY_CARE_PROVIDER_SITE_OTHER): Payer: BLUE CROSS/BLUE SHIELD | Admitting: Cardiovascular Disease

## 2018-05-07 ENCOUNTER — Encounter: Payer: Self-pay | Admitting: Cardiovascular Disease

## 2018-05-07 VITALS — BP 146/91 | HR 72 | Ht 65.0 in | Wt 205.8 lb

## 2018-05-07 DIAGNOSIS — I10 Essential (primary) hypertension: Secondary | ICD-10-CM | POA: Diagnosis not present

## 2018-05-07 DIAGNOSIS — E669 Obesity, unspecified: Secondary | ICD-10-CM

## 2018-05-07 DIAGNOSIS — E1169 Type 2 diabetes mellitus with other specified complication: Secondary | ICD-10-CM | POA: Diagnosis not present

## 2018-05-07 DIAGNOSIS — R002 Palpitations: Secondary | ICD-10-CM | POA: Diagnosis not present

## 2018-05-07 DIAGNOSIS — E782 Mixed hyperlipidemia: Secondary | ICD-10-CM

## 2018-05-07 DIAGNOSIS — E668 Other obesity: Secondary | ICD-10-CM

## 2018-05-07 NOTE — Progress Notes (Signed)
Cardiology Office Note    Date:  05/07/2018   ID:  Tracy Knight, DOB [birth year], MRN [id number]  PCP:  Sharilyn Sites, MD  Cardiologist:   Sanda Klein, MD   Chief Complaint  Patient presents with  . Follow-up  Hypertension, palpitations, multiple coronary risk factors  History of Present Illness:  Tracy Knight is a 57 y.o. female with obesity, hyperlipidemia, type 2 diabetes mellitus, hypertension and palpitations here for routine follow-up.   She still has occasional palpitations but for the most part these are well controlled.  She denies shortness of breath or dyspnea with activity.  She works second shift and is able to go to exercise before work fairly consistently.  She goes to water aerobics 3 days a week.  Unfortunately she has not been able to lose any weight.  She reports that her typical blood pressure is 130/70.  Today her blood pressure was elevated, even after I rechecked it after 50 minutes at rest.  Her last lipid profile was excellent and glycemic control was good on metformin monotherapy.  The patient specifically denies any chest pain at rest or with exertion, dyspnea at rest or with exertion, orthopnea, paroxysmal nocturnal dyspnea, syncope, palpitations, focal neurological deficits, intermittent claudication, lower extremity edema, unexplained weight gain, cough, hemoptysis or wheezing.  She had a remote cardiac catheterization in 2005 with normal coronary arteries. Her echo shows normal left ventricular ejection fraction, borderline left atrial enlargement and borderline criteria for mitral valve prolapse, mild mitral insufficiency. Past treatment with carvedilol caused problems with wheezing and asthma    Past Medical History:  Diagnosis Date  . Asthma   . Diabetes mellitus without complication ([place])   . Dyslipidemia   . History of nuclear stress test 02/2009   exercise; normal pattern of perfusion; no significant ischemia demonstrated; low risk scan   .  Hypertension     Past Surgical History:  Procedure Laterality Date  . CARDIAC CATHETERIZATION  07/2003   no evidence of CAD  . COLONOSCOPY N/A 02/17/2017   Procedure: COLONOSCOPY;  Surgeon: Daneil Dolin, MD;  [facility];  Service: Endoscopy;  Laterality: N/A;  10:00am  . POLYPECTOMY  02/17/2017   Procedure: POLYPECTOMY;  Surgeon: Daneil Dolin, MD;  [facility];  Service: Endoscopy;;  colon  . removal cyst on chin    . TRANSTHORACIC ECHOCARDIOGRAM  03/2007   EF=>55%; borderline LA enlargement; MV mildly thickened/borderline MVP/mild MR    Current Medications: Outpatient Medications Prior to Visit  Medication Sig Dispense Refill  . cetirizine (ZYRTEC) 10 MG tablet Take 10 mg by mouth daily.    . cholecalciferol (VITAMIN D) 1000 UNITS tablet Take 1,000 Units by mouth 3 (three) times a week.     . Cyanocobalamin (VITAMIN B-12) 2500 MCG SUBL Place 2,500 mcg under the tongue daily.    Marland Kitchen diltiazem (TIAZAC) 360 MG 24 hr capsule TAKE 1 CAPSULE BY MOUTH DAILY 90 capsule 2  . Ferrous Sulfate Dried (SLOW RELEASE IRON) 45 MG TBCR Take 45 mg by mouth daily.    . fish oil-omega-3 fatty acids 1000 MG capsule Take 1,000 mg by mouth daily.     Marland Kitchen guaiFENesin (MUCINEX) 600 MG 12 hr tablet Take 600 mg by mouth daily as needed (for congestion/cough).    Marland Kitchen ibuprofen (ADVIL,MOTRIN) 200 MG tablet Take 200-400 mg by mouth every 8 (eight) hours as needed (for pain/headache.).    Marland Kitchen levalbuterol (XOPENEX) 1.25 MG/3ML nebulizer solution Inhale 1.25 mg  into the lungs every 6 (six) hours as needed. For wheezing/shortness of breath  3  . losartan (COZAAR) 50 MG tablet TAKE 1 TABLET(50 MG) BY MOUTH DAILY 90 tablet 0  . metFORMIN (GLUCOPHAGE) 1000 MG tablet Take 1,000 mg by mouth daily before lunch.    . montelukast (SINGULAIR) 10 MG tablet Take 10 mg by mouth daily before lunch.     . pravastatin (PRAVACHOL) 40 MG tablet Take 1 tablet (40 mg total) by mouth daily. 90 tablet 0  . PROAIR HFA  108 (90 Base) MCG/ACT inhaler INHALE 2 PUFFS INTO THE LUNGS Q 4 H PRN  2  . triamterene-hydrochlorothiazide (MAXZIDE-25) 37.5-25 MG tablet Take 1 tablet by mouth daily.     No facility-administered medications prior to visit.      Allergies:   Patient has no known allergies.   Social History   Socioeconomic History  . Marital status: Married    Spouse name: Not on file  . Number of children: Not on file  . Years of education: Not on file  . Highest education level: Not on file  Occupational History  . Not on file  Social Needs  . Financial resource strain: Not on file  . Food insecurity:    Worry: Not on file    Inability: Not on file  . Transportation needs:    Medical: Not on file    Non-medical: Not on file  Tobacco Use  . Smoking status: Never Smoker  . Smokeless tobacco: Never Used  . Tobacco comment: never smoke.  Substance and Sexual Activity  . Alcohol use: No  . Drug use: No  . Sexual activity: Yes    Birth control/protection: Post-menopausal  Lifestyle  . Physical activity:    Days per week: Not on file    Minutes per session: Not on file  . Stress: Not on file  Relationships  . Social connections:    Talks on phone: Not on file    Gets together: Not on file    Attends religious service: Not on file    Active member of club or organization: Not on file    Attends meetings of clubs or organizations: Not on file    Relationship status: Not on file  Other Topics Concern  . Not on file  Social History Narrative  . Not on file     Family History:  The patient's family history includes Diabetes in her daughter; Heart disease in her father and mother; Hypertension in her daughter and sister.   ROS:   Please see the history of present illness.    ROS all other systems are reviewed and are negative   PHYSICAL EXAM:   VS:  BP (!) 146/91   Pulse 72   Ht 5\' 5"  (1.651 m)   Wt 205 lb 12.8 oz (93.4 kg)   BMI 34.25 kg/m     General: Alert, oriented x3,  no distress, moderately obese Head: no evidence of trauma, PERRL, EOMI, no exophtalmos or lid lag, no myxedema, no xanthelasma; normal ears, nose and oropharynx Neck: normal jugular venous pulsations and no hepatojugular reflux; brisk carotid pulses without delay and no carotid bruits Chest: clear to auscultation, no signs of consolidation by percussion or palpation, normal fremitus, symmetrical and full respiratory excursions Cardiovascular: normal position and quality of the apical impulse, regular rhythm, normal first and second heart sounds, no murmurs, rubs or gallops Abdomen: no tenderness or distention, no masses by palpation, no abnormal pulsatility or arterial bruits,  normal bowel sounds, no hepatosplenomegaly Extremities: no clubbing, cyanosis or edema; 2+ radial, ulnar and brachial pulses bilaterally; 2+ right femoral, posterior tibial and dorsalis pedis pulses; 2+ left femoral, posterior tibial and dorsalis pedis pulses; no subclavian or femoral bruits Neurological: grossly nonfocal Psych: Normal mood and affect   Wt Readings from Last 3 Encounters:  05/07/18 205 lb 12.8 oz (93.4 kg)  08/11/17 203 lb (92.1 kg)  04/16/17 207 lb (93.9 kg)      Studies/Labs Reviewed:   EKG:  EKG is ordered today.  The ekg ordered today demonstrates sinus rhythm, normal tracing Recent Labs: Recent labs from Dr. Vennie Homans (from May and Sep 2019) showed hemoglobin A1c 6.7%, total cholesterol 147, HDL 67, LDL 69, triglycerides 56 Lipid Panel    Component Value Date/Time   CHOL 167 04/06/2013 1049   TRIG 52 04/06/2013 1049   HDL 72 04/06/2013 1049   CHOLHDL 2.3 04/06/2013 1049   VLDL 10 04/06/2013 1049   LDLCALC 85 04/06/2013 1049     ASSESSMENT:    1. Palpitations   2. Essential hypertension   3. Diabetes mellitus type 2 in obese ([place])   4. Moderate obesity   5. Hyperlipidemia, mixed      PLAN:  In order of problems listed above:  1. Palpitations: Satisfactory control 2. HTN:  Usually well controlled, but quite high today.  Discussed the fact that our target is less than 130/80.  She will check her blood pressure at home and send some readings to [place] over the next couple of weeks.  If her blood pressure remains elevated would recommend increasing losartan to 100 mg daily 3. DM: Good control 4. Obesity: Congratulated for the sustained commitment to exercise, even though she is frustrated that she cannot lose weight.  Discussed healthy diet changes. 5. HLP: All lipid parameters are excellent, well within desirable range.    Medication Adjustments/Labs and Tests Ordered: Current medicines are reviewed at length with the patient today.  Concerns regarding medicines are outlined above.  Medication changes, Labs and Tests ordered today are listed in the Patient Instructions below. Patient Instructions  Medication Instructions:  Dr Sallyanne Kuster recommends that you continue on your current medications as directed. Please refer to the Current Medication list given to you today.  If you need a refill on your cardiac medications before your next appointment, please call your pharmacy.   Follow-Up: At [facility], you and your health needs are our priority.  As part of our continuing mission to provide you with exceptional heart care, we have created designated Provider Care Teams.  These Care Teams include your primary Cardiologist (physician) and Advanced Practice Providers (APPs -  Physician Assistants and Nurse Practitioners) who all work together to provide you with the care you need, when you need it. You will need a follow up appointment in 12 months.  Please call our office 2 months in advance to schedule this appointment.  You may see Sanda Klein, MD or one of the following Advanced Practice Providers on your designated Care Team: [place], [place] . Fabian Sharp, PA-C  Any Other Special Instructions Will Be Listed Below (If Applicable).   Your physician has requested  that you regularly monitor your blood pressure at home. Please use the same machine to check your blood pressure daily. Keep a record of your blood pressures using the log sheet provided. In few weeks, please report your readings back to Dr C. You may use our online patient portal 'MyChart' or you  can call the office to speak with a nurse.     Signed, Sanda Klein, MD  05/07/2018 9:12 AM    [facility] [address], [place], [place]  [address] Phone: [phone]; Fax: [phone]

## 2018-05-07 NOTE — Patient Instructions (Signed)
Medication Instructions:  Dr Sallyanne Kuster recommends that you continue on your current medications as directed. Please refer to the Current Medication list given to you today.  If you need a refill on your cardiac medications before your next appointment, please call your pharmacy.   Follow-Up: At [facility], you and your health needs are our priority.  As part of our continuing mission to provide you with exceptional heart care, we have created designated Provider Care Teams.  These Care Teams include your primary Cardiologist (physician) and Advanced Practice Providers (APPs -  Physician Assistants and Nurse Practitioners) who all work together to provide you with the care you need, when you need it. You will need a follow up appointment in 12 months.  Please call our office 2 months in advance to schedule this appointment.  You may see Sanda Klein, MD or one of the following Advanced Practice Providers on your designated Care Team: [place], [place] . Fabian Sharp, PA-C  Any Other Special Instructions Will Be Listed Below (If Applicable).   Your physician has requested that you regularly monitor your blood pressure at home. Please use the same machine to check your blood pressure daily. Keep a record of your blood pressures using the log sheet provided. In few weeks, please report your readings back to Dr C. You may use our online patient portal 'MyChart' or you can call the office to speak with a nurse.

## 2018-05-08 DIAGNOSIS — M79671 Pain in right foot: Secondary | ICD-10-CM | POA: Diagnosis not present

## 2018-06-30 ENCOUNTER — Other Ambulatory Visit: Payer: Self-pay | Admitting: Cardiovascular Disease

## 2018-07-19 DIAGNOSIS — E6609 Other obesity due to excess calories: Secondary | ICD-10-CM | POA: Diagnosis not present

## 2018-07-19 DIAGNOSIS — E785 Hyperlipidemia, unspecified: Secondary | ICD-10-CM | POA: Diagnosis not present

## 2018-07-19 DIAGNOSIS — Z6834 Body mass index (BMI) 34.0-34.9, adult: Secondary | ICD-10-CM | POA: Diagnosis not present

## 2018-07-19 DIAGNOSIS — I10 Essential (primary) hypertension: Secondary | ICD-10-CM | POA: Diagnosis not present

## 2018-07-19 DIAGNOSIS — E1165 Type 2 diabetes mellitus with hyperglycemia: Secondary | ICD-10-CM | POA: Diagnosis not present

## 2018-07-19 DIAGNOSIS — Z1389 Encounter for screening for other disorder: Secondary | ICD-10-CM | POA: Diagnosis not present

## 2018-07-19 DIAGNOSIS — E119 Type 2 diabetes mellitus without complications: Secondary | ICD-10-CM | POA: Diagnosis not present

## 2018-07-19 DIAGNOSIS — M150 Primary generalized (osteo)arthritis: Secondary | ICD-10-CM | POA: Diagnosis not present

## 2018-11-03 DIAGNOSIS — J4521 Mild intermittent asthma with (acute) exacerbation: Secondary | ICD-10-CM | POA: Diagnosis not present

## 2018-11-03 DIAGNOSIS — E6609 Other obesity due to excess calories: Secondary | ICD-10-CM | POA: Diagnosis not present

## 2018-11-03 DIAGNOSIS — I10 Essential (primary) hypertension: Secondary | ICD-10-CM | POA: Diagnosis not present

## 2018-11-03 DIAGNOSIS — Z6834 Body mass index (BMI) 34.0-34.9, adult: Secondary | ICD-10-CM | POA: Diagnosis not present

## 2018-11-03 DIAGNOSIS — E785 Hyperlipidemia, unspecified: Secondary | ICD-10-CM | POA: Diagnosis not present

## 2018-11-03 DIAGNOSIS — E119 Type 2 diabetes mellitus without complications: Secondary | ICD-10-CM | POA: Diagnosis not present

## 2018-11-03 DIAGNOSIS — M150 Primary generalized (osteo)arthritis: Secondary | ICD-10-CM | POA: Diagnosis not present

## 2018-11-03 DIAGNOSIS — Z1389 Encounter for screening for other disorder: Secondary | ICD-10-CM | POA: Diagnosis not present

## 2018-11-23 ENCOUNTER — Other Ambulatory Visit (HOSPITAL_COMMUNITY): Payer: Self-pay | Admitting: Family Medicine

## 2018-11-23 DIAGNOSIS — Z1231 Encounter for screening mammogram for malignant neoplasm of breast: Secondary | ICD-10-CM

## 2018-11-26 ENCOUNTER — Other Ambulatory Visit: Payer: Self-pay

## 2018-11-26 ENCOUNTER — Ambulatory Visit (HOSPITAL_COMMUNITY)
Admission: RE | Admit: 2018-11-26 | Discharge: 2018-11-26 | Disposition: A | Discharge status: Home / Self Care | Payer: BLUE CROSS/BLUE SHIELD | Source: Ambulatory Visit | Attending: Family Medicine | Admitting: Family Medicine

## 2018-11-26 DIAGNOSIS — Z1231 Encounter for screening mammogram for malignant neoplasm of breast: Secondary | ICD-10-CM | POA: Diagnosis not present

## 2018-11-29 DIAGNOSIS — C801 Malignant (primary) neoplasm, unspecified: Secondary | ICD-10-CM

## 2018-11-29 HISTORY — DX: Malignant (primary) neoplasm, unspecified: C80.1

## 2018-11-30 ENCOUNTER — Other Ambulatory Visit (HOSPITAL_COMMUNITY): Payer: Self-pay | Admitting: Family Medicine

## 2018-11-30 DIAGNOSIS — R928 Other abnormal and inconclusive findings on diagnostic imaging of breast: Secondary | ICD-10-CM

## 2018-12-07 ENCOUNTER — Other Ambulatory Visit: Payer: Self-pay

## 2018-12-07 ENCOUNTER — Ambulatory Visit (HOSPITAL_COMMUNITY)
Admission: RE | Admit: 2018-12-07 | Discharge: 2018-12-07 | Disposition: A | Discharge status: Home / Self Care | Payer: BC Managed Care – PPO | Source: Ambulatory Visit | Attending: Family Medicine | Admitting: Family Medicine

## 2018-12-07 DIAGNOSIS — N6321 Unspecified lump in the left breast, upper outer quadrant: Secondary | ICD-10-CM | POA: Diagnosis not present

## 2018-12-07 DIAGNOSIS — R928 Other abnormal and inconclusive findings on diagnostic imaging of breast: Secondary | ICD-10-CM

## 2018-12-13 ENCOUNTER — Other Ambulatory Visit: Payer: Self-pay | Admitting: Family Medicine

## 2018-12-13 DIAGNOSIS — N6320 Unspecified lump in the left breast, unspecified quadrant: Secondary | ICD-10-CM

## 2018-12-13 DIAGNOSIS — R928 Other abnormal and inconclusive findings on diagnostic imaging of breast: Secondary | ICD-10-CM

## 2018-12-15 ENCOUNTER — Ambulatory Visit
Admission: RE | Admit: 2018-12-15 | Discharge: 2018-12-15 | Disposition: A | Discharge status: Home / Self Care | Payer: BC Managed Care – PPO | Source: Ambulatory Visit | Attending: Family Medicine | Admitting: Family Medicine

## 2018-12-15 ENCOUNTER — Other Ambulatory Visit: Payer: Self-pay

## 2018-12-15 DIAGNOSIS — N6320 Unspecified lump in the left breast, unspecified quadrant: Secondary | ICD-10-CM

## 2018-12-15 DIAGNOSIS — R928 Other abnormal and inconclusive findings on diagnostic imaging of breast: Secondary | ICD-10-CM

## 2018-12-15 DIAGNOSIS — R590 Localized enlarged lymph nodes: Secondary | ICD-10-CM | POA: Diagnosis not present

## 2018-12-15 DIAGNOSIS — C50312 Malignant neoplasm of lower-inner quadrant of left female breast: Secondary | ICD-10-CM | POA: Diagnosis not present

## 2018-12-15 DIAGNOSIS — C50412 Malignant neoplasm of upper-outer quadrant of left female breast: Secondary | ICD-10-CM | POA: Diagnosis not present

## 2018-12-15 DIAGNOSIS — N6321 Unspecified lump in the left breast, upper outer quadrant: Secondary | ICD-10-CM | POA: Diagnosis not present

## 2018-12-15 DIAGNOSIS — N6324 Unspecified lump in the left breast, lower inner quadrant: Secondary | ICD-10-CM | POA: Diagnosis not present

## 2018-12-17 ENCOUNTER — Telehealth: Payer: Self-pay | Admitting: *Deleted

## 2018-12-17 DIAGNOSIS — C50812 Malignant neoplasm of overlapping sites of left female breast: Secondary | ICD-10-CM | POA: Insufficient documentation

## 2018-12-17 DIAGNOSIS — Z170 Estrogen receptor positive status [ER+]: Secondary | ICD-10-CM | POA: Insufficient documentation

## 2018-12-17 NOTE — Telephone Encounter (Signed)
Confirmed [facility] for 12/22/18 at 8:15am .  Instructions and contact information given.

## 2018-12-21 NOTE — Progress Notes (Addendum)
[facility] NOTE  Patient Care Team: Sharilyn Sites, MD as PCP - General (Family Medicine) Croitoru, Dani Gobble, MD as PCP - Cardiology (Cardiology) Mauro Kaufmann, RN as Oncology Nurse Navigator Rockwell Germany, RN as Oncology Nurse Navigator Alphonsa Overall, MD as Consulting Physician (General Surgery) Nicholas Lose, MD as Consulting Physician (Hematology and Oncology) Kyung Rudd, MD as Consulting Physician (Radiation Oncology)  CHIEF COMPLAINTS/PURPOSE OF CONSULTATION:  Newly diagnosed breast cancer  HISTORY OF PRESENTING ILLNESS:  Tracy Knight 58 y.o. female is here because of recent diagnosis of invasive mammary carcinoma of the left breast. The cancer was detected on a routine screening mammogram on 11/26/18 and was not palpable prior to diagnosis. Diagnostic mammogram and [place] on 12/07/18 showed a 1.5cm left breast mass at the 2 o'clock position 7cm from the nipple, indeterminate left axillary lymphadenopathy with cortical thickening up to 82m, and possible distortion in th upper inner left breast. Biopsy on 12/05/18 showed grade 2-3 invasive mammary carcinoma with mammary carcinoma in situ in the upper outer and upper inner left breast, HER2 negative, ER 100%, PR 100%, Ki67 10%, with no evidence of carcinoma in the lymph node. She presents to the clinic today for initial evaluation and discussion of treatment options.   I reviewed her records extensively and collaborated the history with the patient.  SUMMARY OF ONCOLOGIC HISTORY: Oncology History  Malignant neoplasm of overlapping sites of left breast in female, estrogen receptor positive ([place]  12/17/2018 Initial Diagnosis   Screening mammogram detected 1.5cm left breast mass, left breast distortion, and cortical thickening in left axillary lymph node. Biopsy confirmed invasive mammary carcinoma with mammary carcinoma in situ, grade 2-3, HER2- (1+), ER+ (100%), PR+ (100%), Ki67 10%. No evidence of carcinoma in the left  axillary lymph node.       MEDICAL HISTORY:  Past Medical History:  Diagnosis Date  . Asthma   . [place]   . Dyslipidemia   . History of nuclear stress test 02/2009   exercise; normal pattern of perfusion; no significant ischemia demonstrated; low risk scan   . Hypertension     SURGICAL HISTORY: Past Surgical History:  Procedure Laterality Date  . CARDIAC CATHETERIZATION  07/2003   no evidence of CAD  . COLONOSCOPY N/A 02/17/2017   Procedure: COLONOSCOPY;  Surgeon: RDaneil Dolin MD;  [facility];  Service: Endoscopy;  Laterality: N/A;  10:00am  . POLYPECTOMY  02/17/2017   Procedure: POLYPECTOMY;  Surgeon: RDaneil Dolin MD;  [facility];  Service: Endoscopy;;  colon  . removal cyst on chin    . TRANSTHORACIC ECHOCARDIOGRAM  03/2007   EF=>55%; borderline LA enlargement; MV mildly thickened/borderline MVP/mild MR    SOCIAL HISTORY: Social History   Socioeconomic History  . Marital status: Married    Spouse name: Not on file  . Number of children: Not on file  . Years of education: Not on file  . Highest education level: Not on file  Occupational History  . Not on file  Social Needs  . Financial resource strain: Not on file  . Food insecurity    Worry: Not on file    Inability: Not on file  . Transportation needs    Medical: Not on file    Non-medical: Not on file  Tobacco Use  . Smoking status: Never Smoker  . Smokeless tobacco: Never Used  . Tobacco comment: never smoke.  Substance and Sexual Activity  . Alcohol use:  No  . Drug use: No  . Sexual activity: Yes    Birth control/protection: Post-menopausal  Lifestyle  . Physical activity    Days per week: Not on file    Minutes per session: Not on file  . Stress: Not on file  Relationships  . Social Herbalist on phone: Not on file    Gets together: Not on file    Attends religious service: Not on file    Active member of club or  organization: Not on file    Attends meetings of clubs or organizations: Not on file    Relationship status: Not on file  . Intimate partner violence    Fear of current or ex partner: Not on file    Emotionally abused: Not on file    Physically abused: Not on file    Forced sexual activity: Not on file  Other Topics Concern  . Not on file  Social History Narrative  . Not on file    FAMILY HISTORY: Family History  Problem Relation Age of Onset  . Heart disease Mother   . Heart disease Father   . Hypertension Sister   . Hypertension Daughter   . Diabetes Daughter     ALLERGIES:  has No Known Allergies.  MEDICATIONS:  Current Outpatient Medications  Medication Sig Dispense Refill  . anastrozole (ARIMIDEX) 1 MG tablet Take 1 tablet (1 mg total) by mouth daily. 90 tablet 3  . cetirizine (ZYRTEC) 10 MG tablet Take 10 mg by mouth daily.    . cholecalciferol (VITAMIN D) 1000 UNITS tablet Take 1,000 Units by mouth 3 (three) times a week.     . Cyanocobalamin (VITAMIN B-12) 2500 MCG SUBL Place 2,500 mcg under the tongue daily.    Marland Kitchen diltiazem (TIAZAC) 360 MG 24 hr capsule TAKE 1 CAPSULE BY MOUTH DAILY 90 capsule 2  . Ferrous Sulfate Dried (SLOW RELEASE IRON) 45 MG TBCR Take 45 mg by mouth daily.    . fish oil-omega-3 fatty acids 1000 MG capsule Take 1,000 mg by mouth daily.     Marland Kitchen guaiFENesin (MUCINEX) 600 MG 12 hr tablet Take 600 mg by mouth daily as needed (for congestion/cough).    Marland Kitchen ibuprofen (ADVIL,MOTRIN) 200 MG tablet Take 200-400 mg by mouth every 8 (eight) hours as needed (for pain/headache.).    Marland Kitchen levalbuterol (XOPENEX) 1.25 MG/3ML nebulizer solution Inhale 1.25 mg into the lungs every 6 (six) hours as needed. For wheezing/shortness of breath  3  . losartan (COZAAR) 50 MG tablet TAKE 1 TABLET(50 MG) BY MOUTH DAILY 90 tablet 0  . metFORMIN (GLUCOPHAGE) 1000 MG tablet Take 1,000 mg by mouth daily before lunch.    . montelukast (SINGULAIR) 10 MG tablet Take 10 mg by mouth daily  before lunch.     . pravastatin (PRAVACHOL) 40 MG tablet TAKE 1 TABLET(40 MG) BY MOUTH DAILY 90 tablet 3  . PROAIR HFA 108 (90 Base) MCG/ACT inhaler INHALE 2 PUFFS INTO THE LUNGS Q 4 H PRN  2  . triamterene-hydrochlorothiazide (MAXZIDE-25) 37.5-25 MG tablet Take 1 tablet by mouth daily.     No current facility-administered medications for this visit.     REVIEW OF SYSTEMS:   Constitutional: Denies fevers, chills or abnormal night sweats Eyes: Denies blurriness of vision, double vision or watery eyes Ears, nose, mouth, throat, and face: Denies mucositis or sore throat Respiratory: Denies cough, dyspnea or wheezes Cardiovascular: Denies palpitation, chest discomfort or lower extremity swelling Gastrointestinal:  Denies nausea,  heartburn or change in bowel habits Skin: Denies abnormal skin rashes Lymphatics: Denies new lymphadenopathy or easy bruising Neurological:Denies numbness, tingling or new weaknesses Behavioral/Psych: Mood is stable, no new changes  Breast: Denies any palpable lumps or discharge All other systems were reviewed with the patient and are negative.  PHYSICAL EXAMINATION: ECOG PERFORMANCE STATUS: 1 - Symptomatic but completely ambulatory  Vitals:   12/22/18 0859  BP: (!) 159/69  Pulse: 74  Resp: 18  Temp: 98.2 F (36.8 C)  SpO2: 100%   Filed Weights   12/22/18 0859  Weight: 196 lb 9.6 oz (89.2 kg)    Physical exam not done due to COVID-19 precautions  LABORATORY DATA:  I have reviewed the data as listed Lab Results  Component Value Date   WBC 7.0 12/22/2018   HGB 12.8 12/22/2018   HCT 40.1 12/22/2018   MCV 83.9 12/22/2018   PLT 226 12/22/2018   Lab Results  Component Value Date   NA 141 12/22/2018   K 3.2 (L) 12/22/2018   CL 101 12/22/2018   CO2 30 12/22/2018    RADIOGRAPHIC STUDIES: I have personally reviewed the radiological reports and agreed with the findings in the report.  ASSESSMENT AND PLAN:  Malignant neoplasm of overlapping  sites of left breast in female, estrogen receptor positive ([place]) 12/17/2018:Screening mammogram detected 1.5cm left breast mass, left breast distortion, and cortical thickening in left axillary lymph node. Biopsy confirmed invasive mammary carcinoma with mammary carcinoma in situ, grade 2-3, HER2- (1+), ER+ (100%), PR+ (100%), Ki67 10%. No evidence of carcinoma in the left axillary lymph node.  T1CN0 stage Ia  Pathology and radiology counseling:Discussed with the patient, the details of pathology including the type of breast cancer,the clinical staging, the significance of ER, PR and HER-2/neu receptors and the implications for treatment. After reviewing the pathology in detail, we proceeded to discuss the different treatment options between surgery, radiation, chemotherapy, antiestrogen therapies.  Recommendations: 1.  Mastectomy with sentinel lymph node biopsy followed by 2. Oncotype DX testing to determine if chemotherapy would be of any benefit followed by 3. Adjuvant antiestrogen therapy  Oncotype counseling: I discussed Oncotype DX test. I explained to the patient that this is a 21 gene panel to evaluate patient tumors DNA to calculate recurrence score. This would help determine whether patient has high risk or intermediate risk or low risk breast cancer. She understands that if her tumor was found to be high risk, she would benefit from systemic chemotherapy. If low risk, no need of chemotherapy. If she was found to be intermediate risk, we would need to evaluate the score as well as other risk factors and determine if an abbreviated chemotherapy may be of benefit.  Because it could take a month at least for her to undergo surgery, I initiated anastrozole therapy today.  We discussed risks and benefits of anastrozole.  Return to clinic after surgery to discuss final pathology report and then determine if Oncotype DX testing will need to be sent.   All questions were answered. The patient knows  to call the clinic with any problems, questions or concerns.   Rulon Eisenmenger, MD 12/22/2018    I, Molly Dorshimer, am acting as scribe for Nicholas Lose, MD.  I have reviewed the above documentation for accuracy and completeness, and I agree with the above.

## 2018-12-22 ENCOUNTER — Other Ambulatory Visit: Payer: Self-pay

## 2018-12-22 ENCOUNTER — Inpatient Hospital Stay: Payer: BC Managed Care – PPO | Attending: Hematology and Oncology | Admitting: Hematology and Oncology

## 2018-12-22 ENCOUNTER — Ambulatory Visit: Payer: BC Managed Care – PPO | Attending: Surgery | Admitting: Physical Therapy

## 2018-12-22 ENCOUNTER — Inpatient Hospital Stay: Payer: BC Managed Care – PPO

## 2018-12-22 ENCOUNTER — Encounter: Payer: Self-pay | Admitting: Physical Therapy

## 2018-12-22 ENCOUNTER — Ambulatory Visit
Admission: RE | Admit: 2018-12-22 | Discharge: 2018-12-22 | Disposition: A | Discharge status: Home / Self Care | Payer: BC Managed Care – PPO | Source: Ambulatory Visit | Attending: Radiation Oncology | Admitting: Radiation Oncology

## 2018-12-22 DIAGNOSIS — R293 Abnormal posture: Secondary | ICD-10-CM | POA: Diagnosis not present

## 2018-12-22 DIAGNOSIS — Z170 Estrogen receptor positive status [ER+]: Secondary | ICD-10-CM | POA: Insufficient documentation

## 2018-12-22 DIAGNOSIS — E119 Type 2 diabetes mellitus without complications: Secondary | ICD-10-CM | POA: Insufficient documentation

## 2018-12-22 DIAGNOSIS — E785 Hyperlipidemia, unspecified: Secondary | ICD-10-CM | POA: Diagnosis not present

## 2018-12-22 DIAGNOSIS — C50812 Malignant neoplasm of overlapping sites of left female breast: Secondary | ICD-10-CM | POA: Diagnosis not present

## 2018-12-22 DIAGNOSIS — Z79899 Other long term (current) drug therapy: Secondary | ICD-10-CM | POA: Insufficient documentation

## 2018-12-22 DIAGNOSIS — J45909 Unspecified asthma, uncomplicated: Secondary | ICD-10-CM | POA: Diagnosis not present

## 2018-12-22 DIAGNOSIS — I10 Essential (primary) hypertension: Secondary | ICD-10-CM | POA: Diagnosis not present

## 2018-12-22 DIAGNOSIS — Z79811 Long term (current) use of aromatase inhibitors: Secondary | ICD-10-CM

## 2018-12-22 DIAGNOSIS — C50912 Malignant neoplasm of unspecified site of left female breast: Secondary | ICD-10-CM | POA: Diagnosis not present

## 2018-12-22 LAB — CMP (CANCER CENTER ONLY)
ALT: 18 U/L (ref 0–44)
AST: 19 U/L (ref 15–41)
Albumin: 4.4 g/dL (ref 3.5–5.0)
Alkaline Phosphatase: 69 U/L (ref 38–126)
Anion gap: 10 (ref 5–15)
BUN: 24 mg/dL — ABNORMAL HIGH (ref 6–20)
CO2: 30 mmol/L (ref 22–32)
Calcium: 10.2 mg/dL (ref 8.9–10.3)
Chloride: 101 mmol/L (ref 98–111)
Creatinine: 1.01 mg/dL — ABNORMAL HIGH (ref 0.44–1.00)
GFR, Est AFR Am: >60 mL/min (ref >60)
GFR, Estimated: >60 mL/min (ref >60)
Glucose, Bld: 119 mg/dL — ABNORMAL HIGH (ref 70–99)
Potassium: 3.2 mmol/L — ABNORMAL LOW (ref 3.5–5.1)
Sodium: 141 mmol/L (ref 135–145)
Total Bilirubin: 0.8 mg/dL (ref 0.3–1.2)
Total Protein: 8.3 g/dL — ABNORMAL HIGH (ref 6.5–8.1)

## 2018-12-22 LAB — CBC WITH DIFFERENTIAL (CANCER CENTER ONLY)
Abs Immature Granulocytes: 0.01 10*3/uL (ref 0.00–0.07)
Basophils Absolute: 0 10*3/uL (ref 0.0–0.1)
Basophils Relative: 0 %
Eosinophils Absolute: 0.3 10*3/uL (ref 0.0–0.5)
Eosinophils Relative: 4 %
HCT: 40.1 % (ref 36.0–46.0)
Hemoglobin: 12.8 g/dL (ref 12.0–15.0)
Immature Granulocytes: 0 %
Lymphocytes Relative: 30 %
Lymphs Abs: 2.1 10*3/uL (ref 0.7–4.0)
MCH: 26.8 pg (ref 26.0–34.0)
MCHC: 31.9 g/dL (ref 30.0–36.0)
MCV: 83.9 fL (ref 80.0–100.0)
Monocytes Absolute: 0.6 10*3/uL (ref 0.1–1.0)
Monocytes Relative: 9 %
Neutro Abs: 4 10*3/uL (ref 1.7–7.7)
Neutrophils Relative %: 57 %
Platelet Count: 226 10*3/uL (ref 150–400)
RBC: 4.78 MIL/uL (ref 3.87–5.11)
RDW: 13.6 % (ref 11.5–15.5)
WBC Count: 7 10*3/uL (ref 4.0–10.5)
nRBC: 0 % (ref 0.0–0.2)

## 2018-12-22 MED ORDER — ANASTROZOLE 1 MG PO TABS: 1.0000 mg | ORAL_TABLET | Freq: Every day | ORAL | 3 refills | Status: DC

## 2018-12-22 NOTE — Therapy (Signed)
[place], [place], [address] Phone: [phone]   Fax:  [phone]  Physical Therapy Evaluation  Patient Details  Name: Tracy Knight MRN: [id number] Date of Birth: Jul 20, 1960 Referring Provider (PT): Dr. Alphonsa Overall   Encounter Date: 12/22/2018  PT End of Session - 12/22/18 1308    Visit Number  1    Number of Visits  2    Date for PT Re-Evaluation  02/16/19    PT Start Time  0920    PT Stop Time  0942    PT Time Calculation (min)  22 min    Activity Tolerance  Patient tolerated treatment well    Behavior During Therapy  [facility] for tasks assessed/performed       Past Medical History:  Diagnosis Date  . Asthma   . Diabetes mellitus without complication ([place])   . Dyslipidemia   . History of nuclear stress test 02/2009   exercise; normal pattern of perfusion; no significant ischemia demonstrated; low risk scan   . Hypertension     Past Surgical History:  Procedure Laterality Date  . CARDIAC CATHETERIZATION  07/2003   no evidence of CAD  . COLONOSCOPY N/A 02/17/2017   Procedure: COLONOSCOPY;  Surgeon: Daneil Dolin, MD;  [facility];  Service: Endoscopy;  Laterality: N/A;  10:00am  . POLYPECTOMY  02/17/2017   Procedure: POLYPECTOMY;  Surgeon: Daneil Dolin, MD;  Location: [facility];  Service: Endoscopy;;  colon  . removal cyst on chin    . TRANSTHORACIC ECHOCARDIOGRAM  03/2007   EF=>55%; borderline LA enlargement; MV mildly thickened/borderline MVP/mild MR    There were no vitals filed for this visit.   Subjective Assessment - 12/22/18 1246    Subjective  Patient reports she is here today to be seen by her medical team for her newly diagnosed left breast cancer.    Pertinent History  Patient was diagnosed on 11/26/2018 with left grade II-III invasive lobular carcinoma breast cancer. There are 2 areas with one being a distortion in the lower inner quadrant. The other area  measures 1.5 cm and is located in the upper inner quadrant. It is ER/PR positive, HER2 negative, and a Ki67 of 10%.    Patient Stated Goals  Reduce lymphedema risk and learn post op shoulder ROM HEP    Currently in Pain?  Yes    Pain Score  5     Pain Location  Ankle    Pain Orientation  Right    Pain Descriptors / Indicators  Aching    Pain Type  Chronic pain    Pain Onset  More than a month ago    Pain Frequency  Intermittent    Aggravating Factors   Standing    Pain Relieving Factors  Sitting         [facility] PT Assessment - 12/22/18 0001      Assessment   Medical Diagnosis  Left breast cancer    Referring Provider (PT)  Dr. Alphonsa Overall    Onset Date/Surgical Date  11/26/18    Hand Dominance  Right    Prior Therapy  none      Precautions   Precautions  Other (comment)    Precaution Comments  active cancer      Restrictions   Weight Bearing Restrictions  No      Balance Screen   Has the patient fallen in the past 6 months  No  Has the patient had a decrease in activity level because of a fear of falling?   No    Is the patient reluctant to leave their home because of a fear of falling?   No      Home Film/video editor residence    Living Arrangements  Spouse/significant other    Available Help at Discharge  Family      Prior Function   Level of Independence  Independent    Vocation  Full time employment    Chief of Staff; on her feet 8 hours/day    Leisure  She bikes and does an online exercise class 3x/week      Cognition   Overall Cognitive Status  Within Functional Limits for tasks assessed      Posture/Postural Control   Posture/Postural Control  Postural limitations    Postural Limitations  Rounded Shoulders;Forward head      ROM / Strength   AROM / PROM / Strength  AROM;Strength      AROM   AROM Assessment Site  Shoulder;Cervical    Right/Left Shoulder  Right;Left    Right Shoulder Extension   38 Degrees    Right Shoulder Flexion  155 Degrees    Right Shoulder ABduction  166 Degrees    Right Shoulder Internal Rotation  65 Degrees    Right Shoulder External Rotation  77 Degrees    Left Shoulder Extension  41 Degrees    Left Shoulder Flexion  143 Degrees    Left Shoulder ABduction  155 Degrees    Left Shoulder Internal Rotation  70 Degrees    Left Shoulder External Rotation  68 Degrees    Cervical Flexion  WNL    Cervical Extension  WNL    Cervical - Right Side Bend  WNL    Cervical - Left Side Bend  WNL    Cervical - Right Rotation  WNL    Cervical - Left Rotation  WNL      Strength   Overall Strength  Within functional limits for tasks performed        LYMPHEDEMA/ONCOLOGY QUESTIONNAIRE - 12/22/18 1254      Type   Cancer Type  Left breast cancer      Lymphedema Assessments   Lymphedema Assessments  Upper extremities      Right Upper Extremity Lymphedema   10 cm Proximal to Olecranon Process  34.3 cm    Olecranon Process  27.7 cm    10 cm Proximal to Ulnar Styloid Process  24.4 cm    Just Proximal to Ulnar Styloid Process  17.4 cm    Across Hand at [employer]  20.4 cm    At [place] of 2nd Digit  6.9 cm      Left Upper Extremity Lymphedema   10 cm Proximal to Olecranon Process  35.7 cm    Olecranon Process  27.9 cm    10 cm Proximal to Ulnar Styloid Process  24 cm    Just Proximal to Ulnar Styloid Process  17 cm    Across Hand at [employer]  20.3 cm    At [place] of 2nd Digit  6.4 cm          Quick Dash - 12/22/18 0001    Open a tight or new jar  Moderate difficulty    Do heavy household chores (wash walls, wash floors)  No difficulty    Carry a shopping bag  or briefcase  No difficulty    Wash your back  No difficulty    Use a knife to cut food  No difficulty    Recreational activities in which you take some force or impact through your arm, shoulder, or hand (golf, hammering, tennis)  No difficulty    During the past week, to what extent has  your arm, shoulder or hand problem interfered with your normal social activities with family, friends, neighbors, or groups?  Not at all    During the past week, to what extent has your arm, shoulder or hand problem limited your work or other regular daily activities  Not at all    Arm, shoulder, or hand pain.  None    Tingling (pins and needles) in your arm, shoulder, or hand  None    Difficulty Sleeping  No difficulty    DASH Score  4.55 %        Objective measurements completed on examination: See above findings.       Patient was instructed today in a home exercise program today for post op shoulder range of motion. These included active assist shoulder flexion in sitting, scapular retraction, wall walking with shoulder abduction, and hands behind head external rotation.  She was encouraged to do these twice a day, holding 3 seconds and repeating 5 times when permitted by her physician.           PT Education - 12/22/18 1256    Education Details  Lymphedema risk reduction post op shoulder ROM HEP    Person(s) Educated  Patient    Methods  Explanation;Demonstration;Handout    Comprehension  Returned demonstration;Verbalized understanding          PT Long Term Goals - 12/22/18 1315      PT LONG TERM GOAL #1   Title  Patient will demonstrate she has regained shoulder ROM and function post operatively compared to baseline assessments.    Time  8    Period  Weeks    Status  New    Target Date  02/16/19      Breast Clinic Goals - 12/22/18 1315      Patient will be able to verbalize understanding of pertinent lymphedema risk reduction practices relevant to her diagnosis specifically related to skin care.   Time  1    Period  Days    Status  Achieved      Patient will be able to return demonstrate and/or verbalize understanding of the post-op home exercise program related to regaining shoulder range of motion.   Time  1    Period  Days    Status  Achieved       Patient will be able to verbalize understanding of the importance of attending the postoperative After Breast Cancer Class for further lymphedema risk reduction education and therapeutic exercise.   Time  1    Period  Days    Status  Achieved            Plan - 12/22/18 1308    Clinical Impression Statement  Patient was diagnosed on 11/26/2018 with left grade II-III invasive lobular carcinoma breast cancer. There are 2 areas with one being a distortion in the lower inner quadrant. The other area measures 1.5 cm and is located in the upper inner quadrant. It is ER/PR positive, HER2 negative, and a Ki67 of 10%. Her multidisciplinary medical team met prior to her assessments to determine a recommended treatment plan. She is planning  to have a left mastectomy and sentinel node biopsy followed by Oncotype testing and anti-estrogen therapy. She is interested in reconstruction but is undecided. She will benefit from post op PT to regain shoulder ROM and function.    Stability/Clinical Decision Making  Stable/Uncomplicated    Clinical Decision Making  Low    Rehab Potential  Excellent    PT Frequency  --   Eval and 1 f/u visit   PT Treatment/Interventions  ADLs/Self Care Home Management;Therapeutic exercise;Patient/family education    PT Next Visit Plan  Will reassess 3-4 weeks post op to determine needs    PT Home Exercise Plan  Post op shoulder ROM HEP    Consulted and Agree with Plan of Care  Patient       Patient will benefit from skilled therapeutic intervention in order to improve the following deficits and impairments:  Decreased knowledge of precautions, Impaired UE functional use, Pain, Postural dysfunction, Decreased range of motion  Visit Diagnosis: 1. Malignant neoplasm of overlapping sites of left breast in female, estrogen receptor positive ([place])   2. Abnormal posture      Patient will follow up at outpatient cancer rehab 3-4 weeks following surgery.  If the patient requires  physical therapy at that time, a specific plan will be dictated and sent to the referring physician for approval. The patient was educated today on appropriate basic range of motion exercises to begin post operatively and the importance of attending the After Breast Cancer class following surgery.  Patient was educated today on lymphedema risk reduction practices as it pertains to recommendations that will benefit the patient immediately following surgery.  She verbalized good understanding.      Problem List Patient Active Problem List   Diagnosis Date Noted  . Malignant neoplasm of overlapping sites of left breast in female, estrogen receptor positive ([place]) 12/17/2018  . Moderate obesity 03/13/2016  . Palpitations 03/06/2013  . Hyperlipidemia, mixed 03/06/2013  . Hypertension 02/14/2013  . Non-insulin treated type 2 diabetes mellitus ([place]) 02/14/2013   Annia Friendly, PT 12/22/18 1:25 PM  [place], [place], [address] Phone: [phone]   Fax:  [phone]  Name: NGA RABON MRN: [id number] Date of Birth: 1960/07/29

## 2018-12-22 NOTE — Patient Instructions (Signed)

## 2018-12-22 NOTE — Assessment & Plan Note (Signed)
12/17/2018:Screening mammogram detected 1.5cm left breast mass, left breast distortion, and cortical thickening in left axillary lymph node. Biopsy confirmed invasive mammary carcinoma with mammary carcinoma in situ, grade 2-3, HER2- (1+), ER+ (100%), PR+ (100%), Ki67 10%. No evidence of carcinoma in the left axillary lymph node.  T1CN0 stage Ia  Pathology and radiology counseling:Discussed with the patient, the details of pathology including the type of breast cancer,the clinical staging, the significance of ER, PR and HER-2/neu receptors and the implications for treatment. After reviewing the pathology in detail, we proceeded to discuss the different treatment options between surgery, radiation, chemotherapy, antiestrogen therapies.  Recommendations: 1.  Mastectomy with sentinel lymph node biopsy followed by 2. Oncotype DX testing to determine if chemotherapy would be of any benefit followed by 3. Adjuvant antiestrogen therapy  Oncotype counseling: I discussed Oncotype DX test. I explained to the patient that this is a 21 gene panel to evaluate patient tumors DNA to calculate recurrence score. This would help determine whether patient has high risk or intermediate risk or low risk breast cancer. She understands that if her tumor was found to be high risk, she would benefit from systemic chemotherapy. If low risk, no need of chemotherapy. If she was found to be intermediate risk, we would need to evaluate the score as well as other risk factors and determine if an abbreviated chemotherapy may be of benefit.  Return to clinic after surgery to discuss final pathology report and then determine if Oncotype DX testing will need to be sent.

## 2018-12-27 ENCOUNTER — Other Ambulatory Visit: Payer: Self-pay | Admitting: Internal Medicine

## 2018-12-27 ENCOUNTER — Other Ambulatory Visit: Payer: Self-pay

## 2018-12-27 DIAGNOSIS — Z20822 Contact with and (suspected) exposure to covid-19: Secondary | ICD-10-CM

## 2018-12-27 DIAGNOSIS — R6889 Other general symptoms and signs: Secondary | ICD-10-CM | POA: Diagnosis not present

## 2018-12-29 ENCOUNTER — Telehealth: Payer: Self-pay | Admitting: *Deleted

## 2018-12-29 NOTE — Telephone Encounter (Signed)
  Oncology Nurse Navigator Documentation  Navigator Location: [facility] (12/29/18 1600)   )Navigator Encounter Type: [place] Follow-up;Telephone;Other (12/29/18 1600) Telephone: [facility] Follow-up;Outgoing Call (12/29/18 1600)                                                  Time Spent with Patient: 15 (12/29/18 1600)

## 2018-12-30 LAB — NOVEL CORONAVIRUS, NAA: SARS-CoV-2, NAA: NOT DETECTED

## 2018-12-31 DIAGNOSIS — Z170 Estrogen receptor positive status [ER+]: Secondary | ICD-10-CM | POA: Diagnosis not present

## 2018-12-31 DIAGNOSIS — C50812 Malignant neoplasm of overlapping sites of left female breast: Secondary | ICD-10-CM | POA: Diagnosis not present

## 2019-01-02 ENCOUNTER — Other Ambulatory Visit (HOSPITAL_COMMUNITY): Payer: Self-pay | Admitting: Surgery

## 2019-01-02 DIAGNOSIS — C50912 Malignant neoplasm of unspecified site of left female breast: Secondary | ICD-10-CM

## 2019-01-02 DIAGNOSIS — Z170 Estrogen receptor positive status [ER+]: Secondary | ICD-10-CM

## 2019-01-05 ENCOUNTER — Telehealth: Payer: Self-pay

## 2019-01-05 NOTE — Telephone Encounter (Signed)
Nutrition  Patient with new diagnosis of breast cancer.  Attended [place] on 6/24 and nutrition packet was given at that time.   RD working remotely.  Called patient this am to introduce self and service at [facility].  Patient has been eating more plant foods and good sources of lean meats.  Has tried green tea and likes it. Reports good appetite.    Chart reviewed.   Encouraged patient to call RD if further questions or concerns regarding nutrition in the future.   Nhyira Leano B. Zenia Resides, [place], [place] Registered Dietitian [phone] (pager)

## 2019-01-10 ENCOUNTER — Encounter: Payer: Self-pay | Admitting: *Deleted

## 2019-01-10 NOTE — Progress Notes (Signed)
Clinical Social Work [place] Psychosocial Distress Screening [place]  Patient completed distress screening protocol and scored a 3 on the Psychosocial Distress Thermometer which indicates mild distress. Clinical Social Worker contacted patient at home after [facility] to assess for distress and other psychosocial needs. Patient stated she was she was doing "well" and had not concerns at this time.  CSW and patient discussed common feeling and emotions when being diagnosed with cancer, and the importance of support during treatment. CSW informed patient of the support team and support services at [facility]. CSW provided contact information and encouraged patient to call with any questions or concerns.  ON[facility]N DISTRESS SCREENING 01/10/2019  Screening Type Initial Screening  Distress experienced in past week (1-10) 3  Emotional problem type Adjusting to illness  Spiritual/Religous concerns type Relating to God  Information Concerns Type Lack of info about treatment;Lack of info about complementary therapy choices     Johnnye Lana, MSW, LCSW, OSW-C Clinical Social Worker [place] [phone]

## 2019-01-11 ENCOUNTER — Telehealth: Payer: Self-pay | Admitting: Hematology and Oncology

## 2019-01-11 NOTE — Telephone Encounter (Signed)
Scheduled appt per 7/13 sch message - unable to reach pt . And unable to leave message . Sent reminder letter with appt date and time

## 2019-02-03 DIAGNOSIS — C50812 Malignant neoplasm of overlapping sites of left female breast: Secondary | ICD-10-CM | POA: Diagnosis not present

## 2019-02-03 DIAGNOSIS — Z170 Estrogen receptor positive status [ER+]: Secondary | ICD-10-CM | POA: Diagnosis not present

## 2019-02-03 NOTE — H&P (Signed)
  Subjective:     Patient ID: Tracy Knight is a 58 y.o. female.  HPI  Here for follow up discussion breast reconstruction prior to planned left mastectomy. Presented following screening MMG with possible left breast mass. MMG/US showed a 1.5cm left breast mass at the 2 o'clock position 7cmfn, indeterminate left axillary lymphadenopathy with cortical thickening up to 27m, and possible distortion in th upper inner left breast. Biopsies labeled left breast 2 o clock and left LIQ showed invasive mammary carcinoma with mammary carcinoma in situ in the upper outer and upper inner left breast, ER/PR+ HER2 -. Biopsy LN benign. E cadherin negative suggesting lobular. Given span of disease mastectomy recommended.  Plan Oncotype on surgical specimen. Arimidex started.  Current 42 C. 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 No light duty available. Lives with husband and 168yo daughter.  Review of Systems     Objective:   Physical Exam  Cardiovascular: Normal rate, regular rhythm and normal heart sounds.  Pulmonary/Chest: Effort normal and breath sounds normal.  Lymphadenopathy:    She has no axillary adenopathy.   Breasts no palpable masses Grade 3 ptosis bilateral SN to nipple R 32 L 32 cm BW R 20 L 20 cm (CW 15 cm) Nipple to IMF R 12 L 12 cm  Assessment:     Left breast ca overlapping sites ER+    Plan:     Plan left mastectomy with immediate expander acellular dermis reconstruction.  Reviewedanticipated incisions/scars, drains, OR length, hospital stay and recovery, limitations. Discussed process of expansion and implant based risks including rupture, MRI surveillance for silicone implants, infection requiring surgery or removal, contracture. Reviewed risks mastectomy flap necrosis requiring additional surgery.  Discussed use of acellular dermis in  reconstruction, cadaveric source, incorporation over several weeks, risk that if has seroma or infection can act as additional nidus for infection if not incorporated.  Discussed prepectoral vs sub pectoral reconstruction. Discussed with patient and benefit of this is no animation deformity, may be less pain. Risk may be more visible rippling over upper poles, greater need of ADM. Reviewed pre pectoral would require larger amount acellular dermis, more drains. Discussed any type reconstruction also risks long term displacement implant and visible rippling. If prepectoral counseled I would recommend she be comfortable with silicone implants as more options that have less rippling. She agrees to prepectoral placement.  Reviewed reconstruction will be asensate and not stimulate. Reviewed additional risks including but not limited to risks mastectomy flap necrosis requiring additional surgery, seroma, hematoma, asymmetry, need to additional procedures, fat necrosis, DVT/PE, damage to adjacent structures, cardiopulmonary complications.  Discussed risk COVID infectionthrough this elective surgery. It is likely patient will receive COVID testing prior to surgery. Discussed even if patient receivesa negative test result, the tests in some cases may fail to detect the virus or patient maycontract COVID after the test.COVID 19 infectionbefore/during/aftersurgery may result in lead to a higher chance of complication and death.  Reviewed examples of expanders, implants. Rx for [place].  BIrene Limbo MD [facility] [phone] pin [phone]

## 2019-02-04 DIAGNOSIS — M1991 Primary osteoarthritis, unspecified site: Secondary | ICD-10-CM | POA: Diagnosis not present

## 2019-02-04 DIAGNOSIS — I10 Essential (primary) hypertension: Secondary | ICD-10-CM | POA: Diagnosis not present

## 2019-02-04 DIAGNOSIS — J4521 Mild intermittent asthma with (acute) exacerbation: Secondary | ICD-10-CM | POA: Diagnosis not present

## 2019-02-04 DIAGNOSIS — Z6833 Body mass index (BMI) 33.0-33.9, adult: Secondary | ICD-10-CM | POA: Diagnosis not present

## 2019-02-04 DIAGNOSIS — E119 Type 2 diabetes mellitus without complications: Secondary | ICD-10-CM | POA: Diagnosis not present

## 2019-02-11 ENCOUNTER — Encounter (HOSPITAL_BASED_OUTPATIENT_CLINIC_OR_DEPARTMENT_OTHER): Payer: Self-pay | Admitting: *Deleted

## 2019-02-11 ENCOUNTER — Other Ambulatory Visit: Payer: Self-pay

## 2019-02-15 ENCOUNTER — Encounter (HOSPITAL_BASED_OUTPATIENT_CLINIC_OR_DEPARTMENT_OTHER)
Admission: RE | Admit: 2019-02-15 | Discharge: 2019-02-15 | Disposition: A | Discharge status: Home / Self Care | Payer: BC Managed Care – PPO | Source: Ambulatory Visit | Attending: Surgery | Admitting: Surgery

## 2019-02-15 ENCOUNTER — Other Ambulatory Visit: Payer: Self-pay

## 2019-02-15 ENCOUNTER — Other Ambulatory Visit (HOSPITAL_COMMUNITY)
Admission: RE | Admit: 2019-02-15 | Discharge: 2019-02-15 | Disposition: A | Discharge status: Home / Self Care | Payer: BC Managed Care – PPO | Source: Ambulatory Visit | Attending: Surgery | Admitting: Surgery

## 2019-02-15 DIAGNOSIS — Z20828 Contact with and (suspected) exposure to other viral communicable diseases: Secondary | ICD-10-CM | POA: Diagnosis not present

## 2019-02-15 DIAGNOSIS — Z01812 Encounter for preprocedural laboratory examination: Secondary | ICD-10-CM | POA: Insufficient documentation

## 2019-02-15 LAB — SARS CORONAVIRUS 2 (TAT 6-24 HRS): SARS Coronavirus 2: NEGATIVE

## 2019-02-15 LAB — BASIC METABOLIC PANEL
Anion gap: 10 (ref 5–15)
BUN: 14 mg/dL (ref 6–20)
CO2: 29 mmol/L (ref 22–32)
Calcium: 10 mg/dL (ref 8.9–10.3)
Chloride: 101 mmol/L (ref 98–111)
Creatinine, Ser: 0.84 mg/dL (ref 0.44–1.00)
GFR calc Af Amer: >60 mL/min (ref >60)
GFR calc non Af Amer: >60 mL/min (ref >60)
Glucose, Bld: 104 mg/dL — ABNORMAL HIGH (ref 70–99)
Potassium: 3.9 mmol/L (ref 3.5–5.1)
Sodium: 140 mmol/L (ref 135–145)

## 2019-02-15 NOTE — Progress Notes (Signed)

## 2019-02-17 NOTE — H&P (Signed)
Tracy Knight Location: [facility] Patient #: [id number] DOB: 01-03-1961 Undefined / Language: Tracy Knight / Race: Black or African American Female  History of Present Illness   The patient is a 58 year old female who presents with a complaint of breast cancer.  The PCP is Dr. Eddie Knight  The pateint is at the [facility] - Oncology is Drs. Tracy Knight and Tracy Knight She is by herself. her sister Tracy Knight listened in on the phone.  [The Covid-19 virus has disrupted normal medical care in [place] and across the nation. We have sometimes had to alter normal surgical/medical care to limit this epidemic and we have explained these changes to the patient.]  Her last mammogram was about a year ago. She has an aunt on her father's side who had breast cancer. She just went for routine mammograms.  Mammograms: Left breast mass at 2 o'clock, 1.5 cm, UIQ - distortion Biopsy: Left breast biopsy - 12/15/2018 - [id number] - 1. Biopsy at 2 o'clock, ILC, grade 2-3, ER - 100%, PR - 100%, Ki67 - 10%, Her2Neu - neg, 2. Node - negative, 3. LIQ ILC, similar to other tumor Family history of breast or ovarian cancer: aunt on her father's side who had breast cancer. On hormone therapy: No  I discussed the options for breast cancer treatment with the patient. The patient is at the Tracy Knight, which includes medical oncology and radiation oncology. I discussed the surgical options of lumpectomy vs. mastectomy. If mastectomy, there is the possibility of reconstruction. I discussed the options of lymph node biopsy. The treatment plan depends on the pathologic staging of the tumor and the patient's personal wishes. The risks of surgery include, but are not limited to, bleeding, infection, the need for further surgery, and nerve injury. The patient has been given literature on the treatment of breast cancer. With two breast cancers that are  widely separate - I think that she will be best served with a mastectomy.  Plan: 1. Plastic surgery consultation - she sees Tracy Knight on 7/2, 2. left mastectomy with left axillary SLNx (probable reconstruction), 3. Oncotype, 4. Anti hormone tx  Past Medical History: 1. Asthma X 20 years 2. DM x 15 years She thinks that her last HgbA1C was 6.7 3. HTN 4. History of palpitations - followed by Dr. Sallyanne Knight Both her parents had heart attacks. 5. Obesity - BMI - 34 6. colonoscopy - Dr. Cecille Knight  Social History: Married. Husband Tracy Knight Children: Son Tracy Knight 37yo, Daughter Tracy Knight 27yo, and daughter Tracy Knight 17yo. She works for [employer] for Tracy Knight (was Tracy Knight) - Tracy Knight, etc   Past Surgical History Tracy Pummel, RN; 12/22/2018 7:32 AM) Breast Biopsy  Left. multiple  Diagnostic Studies History Tracy Pummel, RN; 12/22/2018 7:32 AM) Colonoscopy  1-5 years ago Mammogram  within last year Pap Smear  1-5 years ago  Medication History Tracy Pummel, RN; 12/22/2018 7:35 AM) Medications Reconciled  Social History Tracy Pummel, RN; 12/22/2018 7:32 AM) Caffeine use  Coffee. No alcohol use  No drug use  Tobacco use  Never smoker.  Family History Tracy Pummel, RN; 12/22/2018 7:32 AM) Cancer  Brother. Cerebrovascular Accident  Family Members In General. Diabetes Mellitus  Brother, Family Members In General, Father, Mother. Heart Disease  Family Members In General, Father, Mother. Heart disease in female family member before age 31  Heart disease in female family member before age 3  Hypertension  Brother, Family Members In [place], Father, Mother.  Pregnancy / Birth History Tracy Spillers Douglas,  RN; 12/22/2018 7:32 AM) Age at menarche  53 years. Age of menopause  64-55 Gravida  3 Irregular periods  Length (months) of breastfeeding  7-12 Maternal age  34-25 Para  37  Other Problems Tracy Pummel, RN; 12/22/2018 7:32 AM) Arthritis  Asthma  Diabetes Mellitus  High blood pressure  Hypercholesterolemia     Review of Systems Tracy Spillers Ledford RN; 12/22/2018 7:32 AM) General Not Present- Appetite Loss, Chills, Fatigue, Fever, Night Sweats, Weight Gain and Weight Loss. Skin Not Present- Change in Wart/Mole, Dryness, Hives, Jaundice, New Lesions, Non-Healing Wounds, Rash and Ulcer. HEENT Not Present- Earache, Hearing Loss, Hoarseness, Nose Bleed, Oral Ulcers, Ringing in the Ears, Seasonal Allergies, Sinus Pain, Sore Throat, Visual Disturbances, Wears glasses/contact lenses and Yellow Eyes. Respiratory Not Present- Bloody sputum, Chronic Cough, Difficulty Breathing, Snoring and Wheezing. Breast Not Present- Breast Mass, Breast Pain, Nipple Discharge and Skin Changes. Cardiovascular Not Present- Chest Pain, Difficulty Breathing Lying Down, Leg Cramps, Palpitations, Rapid Heart Rate, Shortness of Breath and Swelling of Extremities. Gastrointestinal Not Present- Abdominal Pain, Bloating, Bloody Stool, Change in Bowel Habits, Chronic diarrhea, Constipation, Difficulty Swallowing, Excessive gas, Gets full quickly at meals, Hemorrhoids, Indigestion, Nausea, Rectal Pain and Vomiting. Female Genitourinary Not Present- Frequency, Nocturia, Painful Urination, Pelvic Pain and Urgency. Musculoskeletal Not Present- Back Pain, Joint Pain, Joint Stiffness, Muscle Pain, Muscle Weakness and Swelling of Extremities. Neurological Not Present- Decreased Memory, Fainting, Headaches, Numbness, Seizures, Tingling, Tremor, Trouble walking and Weakness. Psychiatric Not Present- Anxiety, Bipolar, Change in Sleep Pattern, Depression, Fearful and Frequent crying. Endocrine Not Present- Cold Intolerance, Excessive Hunger, Hair Changes, Heat Intolerance, Hot flashes and New Diabetes. Hematology Not Present- Blood Thinners, Easy Bruising, Excessive bleeding, Gland problems, HIV and Persistent  Infections.   Physical Exam  General: WN AA F alert and generally healthy appearing. Skin: Inspection and palpation of the skin unremarkable.  Eyes: Conjunctivae white, pupils equal. Face, ears, nose, mouth, and throat: Face - normal. Normal ears and nose. Lips and teeth normal.  Neck: Supple. No mass. Trachea midline. No thyroid mass.  Lymph Nodes: No supraclavicular or cervical adenopathy. No axillary adenopathy.  Lungs: Normal respiratory effort. Clear to auscultation and symmetric breath sounds. Cardiovascular: Regular rate and rythm. Normal auscultation of the heart. No murmur or rub.  Breasts: Right - No mass or nodule Left - She has a mass at 9 c'lock (that is about 1.5 cm) - this may be primarily hemotoma. She has a mass effect, over 2.5 cm in the UOQ of the left breast. This is less specific.  Abdomen: Soft. No mass. Liver and spleen not palpable. No tenderness. No hernia. Normal bowel sounds. No abdominal scars. Rectal: Not done.  Musculoskeletal/extremities: Normal gait. Good strength and ROM in upper and lower extremities.   Neurologic: Grossly intact to motor and sensory function.   Psychiatric: Has normal mood and affect. Judgement and insight appear normal.   Assessment & Plan  1.  MALIGNANT NEOPLASM OF LEFT BREAST, STAGE 1, ESTROGEN RECEPTOR POSITIVE (C50.912)  Left breast biopsy - 12/15/2018 - [id number] - 1. Biopsy at 2 o'clock, ILC, grade 2-3, ER - 100%, PR - 100%, Ki67 - 10%, Her2Neu - neg, 2. Node - negative, 3. LIQ ILC, similar to other tumor  Oncology - [place]ud[place]/Moody  Plan:  1. Plastic surgery consultation - she sees Tracy Knight on 7/2, 01/02/2019 - Completed phone visit with patient today. 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 She said she found this out after she saw you, the workers are on different shifts. I  asked her to inform all her physicians if this comes back positive.  Besides that...she would like to proceed with implant based immediate reconstruction. I will plan prepec expander placement. Since I have not examined her, will let you know if I want you to do that skin reduction pattern mastectomy. Told her we can start looking for dates and we will just have to arrange in person visit in next few weeks.  Thx Brinda 02/05/2019 - Met her in person today, recommend skin reduction pattern mastectomy. (The triangle)  Thanks Brinda   2. left mastectomy with left axillary SLNx (with reconstruction),   3. Oncotype,   4. Anti hormone tx  2. Asthma X 20 years 3. DM x 15 years She thinks that her last HgbA1C was 6.7 4. HTN 5. History of palpitations - followed by Dr. Sallyanne Knight Both her parents had heart attacks. 6. Obesity - BMI - 34   Alphonsa Overall, MD, [facility] Surgery Pager: [phone] Office phone:  [phone]

## 2019-02-18 ENCOUNTER — Ambulatory Visit (HOSPITAL_BASED_OUTPATIENT_CLINIC_OR_DEPARTMENT_OTHER): Payer: BC Managed Care – PPO | Admitting: Certified Registered"

## 2019-02-18 ENCOUNTER — Encounter (HOSPITAL_BASED_OUTPATIENT_CLINIC_OR_DEPARTMENT_OTHER)
Admission: RE | Disposition: A | Discharge status: Home / Self Care | Payer: Self-pay | Source: Home / Self Care | Attending: Surgery

## 2019-02-18 ENCOUNTER — Ambulatory Visit (HOSPITAL_COMMUNITY)
Admission: RE | Admit: 2019-02-18 | Discharge: 2019-02-18 | Disposition: A | Discharge status: Home / Self Care | Payer: BC Managed Care – PPO | Source: Ambulatory Visit | Attending: Surgery | Admitting: Surgery

## 2019-02-18 ENCOUNTER — Other Ambulatory Visit: Payer: Self-pay

## 2019-02-18 ENCOUNTER — Encounter (HOSPITAL_BASED_OUTPATIENT_CLINIC_OR_DEPARTMENT_OTHER): Payer: Self-pay | Admitting: Certified Registered"

## 2019-02-18 ENCOUNTER — Ambulatory Visit (HOSPITAL_BASED_OUTPATIENT_CLINIC_OR_DEPARTMENT_OTHER)
Admission: RE | Admit: 2019-02-18 | Discharge: 2019-02-19 | Disposition: A | Discharge status: Home / Self Care | Payer: BC Managed Care – PPO | Attending: Surgery | Admitting: Surgery

## 2019-02-18 DIAGNOSIS — C50912 Malignant neoplasm of unspecified site of left female breast: Secondary | ICD-10-CM | POA: Diagnosis not present

## 2019-02-18 DIAGNOSIS — I10 Essential (primary) hypertension: Secondary | ICD-10-CM | POA: Diagnosis not present

## 2019-02-18 DIAGNOSIS — C50512 Malignant neoplasm of lower-outer quadrant of left female breast: Secondary | ICD-10-CM | POA: Diagnosis not present

## 2019-02-18 DIAGNOSIS — D4989 Neoplasm of unspecified behavior of other specified sites: Secondary | ICD-10-CM | POA: Diagnosis not present

## 2019-02-18 DIAGNOSIS — C50412 Malignant neoplasm of upper-outer quadrant of left female breast: Secondary | ICD-10-CM | POA: Insufficient documentation

## 2019-02-18 DIAGNOSIS — C50812 Malignant neoplasm of overlapping sites of left female breast: Secondary | ICD-10-CM | POA: Diagnosis not present

## 2019-02-18 DIAGNOSIS — J45909 Unspecified asthma, uncomplicated: Secondary | ICD-10-CM | POA: Diagnosis not present

## 2019-02-18 DIAGNOSIS — C773 Secondary and unspecified malignant neoplasm of axilla and upper limb lymph nodes: Secondary | ICD-10-CM | POA: Insufficient documentation

## 2019-02-18 DIAGNOSIS — Z170 Estrogen receptor positive status [ER+]: Secondary | ICD-10-CM | POA: Insufficient documentation

## 2019-02-18 DIAGNOSIS — E119 Type 2 diabetes mellitus without complications: Secondary | ICD-10-CM | POA: Diagnosis not present

## 2019-02-18 DIAGNOSIS — E785 Hyperlipidemia, unspecified: Secondary | ICD-10-CM | POA: Insufficient documentation

## 2019-02-18 DIAGNOSIS — G8918 Other acute postprocedural pain: Secondary | ICD-10-CM | POA: Diagnosis not present

## 2019-02-18 HISTORY — PX: BREAST RECONSTRUCTION WITH PLACEMENT OF TISSUE EXPANDER AND ALLODERM: SHX6805

## 2019-02-18 HISTORY — PX: MASTECTOMY W/ SENTINEL NODE BIOPSY: SHX2001

## 2019-02-18 LAB — GLUCOSE, CAPILLARY
Glucose-Capillary: 116 mg/dL — ABNORMAL HIGH (ref 70–99)
Glucose-Capillary: 140 mg/dL — ABNORMAL HIGH (ref 70–99)
Glucose-Capillary: 183 mg/dL — ABNORMAL HIGH (ref 70–99)

## 2019-02-18 SURGERY — MASTECTOMY WITH SENTINEL LYMPH NODE BIOPSY
Anesthesia: General | Site: Breast | Laterality: Left

## 2019-02-18 MED ORDER — GABAPENTIN 300 MG PO CAPS
ORAL_CAPSULE | ORAL | Status: AC
  Filled 2019-02-18: qty 1

## 2019-02-18 MED ORDER — PRAVASTATIN SODIUM 40 MG PO TABS: 40.0000 mg | ORAL_TABLET | Freq: Every day | ORAL | Status: DC

## 2019-02-18 MED ORDER — MIDAZOLAM HCL 2 MG/2ML IJ SOLN
1.0000 mg | INTRAMUSCULAR | Status: DC | PRN
  Administered 2019-02-18: 2 mg via INTRAVENOUS

## 2019-02-18 MED ORDER — SODIUM CHLORIDE 0.9 % IV SOLN
INTRAVENOUS | Status: DC
  Administered 2019-02-18: 17:00:00 via INTRAVENOUS

## 2019-02-18 MED ORDER — LIDOCAINE 2% (20 MG/ML) 5 ML SYRINGE
INTRAMUSCULAR | Status: DC | PRN
  Administered 2019-02-18: 100 mg via INTRAVENOUS

## 2019-02-18 MED ORDER — OXYCODONE HCL 5 MG PO TABS: 5.0000 mg | ORAL_TABLET | ORAL | Status: DC | PRN

## 2019-02-18 MED ORDER — SULFAMETHOXAZOLE-TRIMETHOPRIM 800-160 MG PO TABS: 1.0000 | ORAL_TABLET | Freq: Two times a day (BID) | ORAL | 0 refills | Status: DC

## 2019-02-18 MED ORDER — PROPOFOL 10 MG/ML IV BOLUS
INTRAVENOUS | Status: AC
  Filled 2019-02-18: qty 20

## 2019-02-18 MED ORDER — HYDROMORPHONE HCL 1 MG/ML IJ SOLN: 0.2500 mg | INTRAMUSCULAR | Status: DC | PRN

## 2019-02-18 MED ORDER — OXYCODONE HCL 5 MG/5ML PO SOLN: 5.0000 mg | Freq: Once | ORAL | Status: DC | PRN

## 2019-02-18 MED ORDER — LIDOCAINE 2% (20 MG/ML) 5 ML SYRINGE
INTRAMUSCULAR | Status: AC
  Filled 2019-02-18: qty 5

## 2019-02-18 MED ORDER — HYDROMORPHONE HCL 1 MG/ML IJ SOLN: 0.5000 mg | INTRAMUSCULAR | Status: DC | PRN

## 2019-02-18 MED ORDER — ACETAMINOPHEN 500 MG PO TABS
1000.0000 mg | ORAL_TABLET | ORAL | Status: AC
  Administered 2019-02-18: 1000 mg via ORAL

## 2019-02-18 MED ORDER — KETOROLAC TROMETHAMINE 30 MG/ML IJ SOLN
INTRAMUSCULAR | Status: AC
  Filled 2019-02-18: qty 1

## 2019-02-18 MED ORDER — INSULIN ASPART 100 UNIT/ML ~~LOC~~ SOLN
0.0000 [IU] | Freq: Three times a day (TID) | SUBCUTANEOUS | Status: DC
  Administered 2019-02-18: 18:00:00 3 [IU] via SUBCUTANEOUS
  Administered 2019-02-19: 2 [IU] via SUBCUTANEOUS
  Filled 2019-02-18 (×2): qty 1

## 2019-02-18 MED ORDER — OXYCODONE HCL 5 MG PO TABS: 5.0000 mg | ORAL_TABLET | ORAL | 0 refills | Status: DC | PRN

## 2019-02-18 MED ORDER — OXYCODONE HCL 5 MG PO TABS: 5.0000 mg | ORAL_TABLET | Freq: Once | ORAL | Status: DC | PRN

## 2019-02-18 MED ORDER — ALBUTEROL SULFATE (5 MG/ML) 0.5% IN NEBU: 2.5000 mg | INHALATION_SOLUTION | RESPIRATORY_TRACT | Status: DC | PRN

## 2019-02-18 MED ORDER — SODIUM CHLORIDE 0.9 % IV SOLN
INTRAVENOUS | Status: DC | PRN
  Administered 2019-02-18: 14:00:00 1000 mL

## 2019-02-18 MED ORDER — ROCURONIUM BROMIDE 10 MG/ML (PF) SYRINGE
PREFILLED_SYRINGE | INTRAVENOUS | Status: DC | PRN
  Administered 2019-02-18: 20 mg via INTRAVENOUS
  Administered 2019-02-18: 60 mg via INTRAVENOUS

## 2019-02-18 MED ORDER — CEFAZOLIN SODIUM-DEXTROSE 2-4 GM/100ML-% IV SOLN
2.0000 g | INTRAVENOUS | Status: AC
  Administered 2019-02-18: 13:00:00 2 g via INTRAVENOUS

## 2019-02-18 MED ORDER — CHLORHEXIDINE GLUCONATE CLOTH 2 % EX PADS: 6.0000 | MEDICATED_PAD | Freq: Once | CUTANEOUS | Status: DC

## 2019-02-18 MED ORDER — METHYLENE BLUE 0.5 % INJ SOLN
INTRAVENOUS | Status: AC
  Filled 2019-02-18: qty 10

## 2019-02-18 MED ORDER — SUGAMMADEX SODIUM 200 MG/2ML IV SOLN
INTRAVENOUS | Status: DC | PRN
  Administered 2019-02-18: 200 mg via INTRAVENOUS

## 2019-02-18 MED ORDER — PROMETHAZINE HCL 25 MG/ML IJ SOLN: 6.2500 mg | INTRAMUSCULAR | Status: DC | PRN

## 2019-02-18 MED ORDER — ONDANSETRON 4 MG PO TBDP: 4.0000 mg | ORAL_TABLET | Freq: Four times a day (QID) | ORAL | Status: DC | PRN

## 2019-02-18 MED ORDER — SODIUM CHLORIDE (PF) 0.9 % IJ SOLN
INTRAMUSCULAR | Status: AC
  Filled 2019-02-18: qty 10

## 2019-02-18 MED ORDER — ONDANSETRON HCL 4 MG/2ML IJ SOLN
INTRAMUSCULAR | Status: AC
  Filled 2019-02-18: qty 2

## 2019-02-18 MED ORDER — ALBUTEROL SULFATE HFA 108 (90 BASE) MCG/ACT IN AERS: 1.0000 | INHALATION_SPRAY | RESPIRATORY_TRACT | Status: DC | PRN

## 2019-02-18 MED ORDER — GABAPENTIN 300 MG PO CAPS
300.0000 mg | ORAL_CAPSULE | ORAL | Status: AC
  Administered 2019-02-18: 300 mg via ORAL

## 2019-02-18 MED ORDER — ONDANSETRON HCL 4 MG/2ML IJ SOLN
INTRAMUSCULAR | Status: DC | PRN
  Administered 2019-02-18: 4 mg via INTRAVENOUS

## 2019-02-18 MED ORDER — ONDANSETRON HCL 4 MG/2ML IJ SOLN: 4.0000 mg | Freq: Four times a day (QID) | INTRAMUSCULAR | Status: DC | PRN

## 2019-02-18 MED ORDER — TECHNETIUM TC 99M SULFUR COLLOID FILTERED
1.0000 | Freq: Once | INTRAVENOUS | Status: AC | PRN
  Administered 2019-02-18: 1 via INTRADERMAL

## 2019-02-18 MED ORDER — SCOPOLAMINE 1 MG/3DAYS TD PT72: 1.0000 | MEDICATED_PATCH | Freq: Once | TRANSDERMAL | Status: DC

## 2019-02-18 MED ORDER — FENTANYL CITRATE (PF) 100 MCG/2ML IJ SOLN
INTRAMUSCULAR | Status: AC
  Filled 2019-02-18: qty 2

## 2019-02-18 MED ORDER — LACTATED RINGERS IV SOLN
INTRAVENOUS | Status: DC
  Administered 2019-02-18 (×2): via INTRAVENOUS

## 2019-02-18 MED ORDER — TRIAMTERENE-HCTZ 37.5-25 MG PO TABS: 1.0000 | ORAL_TABLET | Freq: Every day | ORAL | Status: DC

## 2019-02-18 MED ORDER — MIDAZOLAM HCL 2 MG/2ML IJ SOLN
INTRAMUSCULAR | Status: AC
  Filled 2019-02-18: qty 2

## 2019-02-18 MED ORDER — METHOCARBAMOL 500 MG PO TABS: 500.0000 mg | ORAL_TABLET | Freq: Three times a day (TID) | ORAL | 0 refills | Status: DC | PRN

## 2019-02-18 MED ORDER — POVIDONE-IODINE 10 % EX SOLN
CUTANEOUS | Status: DC | PRN
  Administered 2019-02-18: 1 via TOPICAL

## 2019-02-18 MED ORDER — FENTANYL CITRATE (PF) 100 MCG/2ML IJ SOLN
50.0000 ug | INTRAMUSCULAR | Status: DC | PRN
  Administered 2019-02-18 (×2): 50 ug via INTRAVENOUS

## 2019-02-18 MED ORDER — CEFAZOLIN SODIUM-DEXTROSE 1-4 GM/50ML-% IV SOLN
1.0000 g | Freq: Three times a day (TID) | INTRAVENOUS | Status: AC
  Administered 2019-02-18 – 2019-02-19 (×2): 1 g via INTRAVENOUS
  Filled 2019-02-18 (×2): qty 50

## 2019-02-18 MED ORDER — MIDAZOLAM HCL 5 MG/5ML IJ SOLN
INTRAMUSCULAR | Status: DC | PRN
  Administered 2019-02-18: 2 mg via INTRAVENOUS

## 2019-02-18 MED ORDER — ROCURONIUM BROMIDE 10 MG/ML (PF) SYRINGE
PREFILLED_SYRINGE | INTRAVENOUS | Status: AC
  Filled 2019-02-18: qty 10

## 2019-02-18 MED ORDER — MEPERIDINE HCL 25 MG/ML IJ SOLN: 6.2500 mg | INTRAMUSCULAR | Status: DC | PRN

## 2019-02-18 MED ORDER — DEXAMETHASONE SODIUM PHOSPHATE 10 MG/ML IJ SOLN
INTRAMUSCULAR | Status: DC | PRN
  Administered 2019-02-18: 5 mg via INTRAVENOUS

## 2019-02-18 MED ORDER — LOSARTAN POTASSIUM 50 MG PO TABS: 50.0000 mg | ORAL_TABLET | Freq: Every day | ORAL | Status: DC

## 2019-02-18 MED ORDER — PROPOFOL 10 MG/ML IV BOLUS
INTRAVENOUS | Status: DC | PRN
  Administered 2019-02-18: 50 mg via INTRAVENOUS
  Administered 2019-02-18: 150 mg via INTRAVENOUS

## 2019-02-18 MED ORDER — KETOROLAC TROMETHAMINE 30 MG/ML IJ SOLN
30.0000 mg | Freq: Three times a day (TID) | INTRAMUSCULAR | Status: AC
  Administered 2019-02-18 – 2019-02-19 (×3): 30 mg via INTRAVENOUS
  Filled 2019-02-18 (×2): qty 1

## 2019-02-18 MED ORDER — FENTANYL CITRATE (PF) 250 MCG/5ML IJ SOLN
INTRAMUSCULAR | Status: DC | PRN
  Administered 2019-02-18 (×2): 50 ug via INTRAVENOUS

## 2019-02-18 MED ORDER — ACETAMINOPHEN 500 MG PO TABS
ORAL_TABLET | ORAL | Status: AC
  Filled 2019-02-18: qty 2

## 2019-02-18 MED ORDER — SODIUM CHLORIDE (PF) 0.9 % IJ SOLN
INTRAVENOUS | Status: DC | PRN
  Administered 2019-02-18: 13:00:00 1 mL via SUBCUTANEOUS

## 2019-02-18 MED ORDER — BUPIVACAINE-EPINEPHRINE (PF) 0.25% -1:200000 IJ SOLN
INTRAMUSCULAR | Status: AC
  Filled 2019-02-18: qty 30

## 2019-02-18 MED ORDER — METHOCARBAMOL 500 MG PO TABS: 500.0000 mg | ORAL_TABLET | Freq: Four times a day (QID) | ORAL | Status: DC | PRN

## 2019-02-18 MED ORDER — DILTIAZEM HCL ER BEADS 240 MG PO CP24: 360.0000 mg | ORAL_CAPSULE | Freq: Every day | ORAL | Status: DC

## 2019-02-18 MED ORDER — ENOXAPARIN SODIUM 40 MG/0.4ML ~~LOC~~ SOLN
40.0000 mg | SUBCUTANEOUS | Status: DC
  Administered 2019-02-19: 40 mg via SUBCUTANEOUS
  Filled 2019-02-18: qty 0.4

## 2019-02-18 MED ORDER — CEFAZOLIN SODIUM-DEXTROSE 2-4 GM/100ML-% IV SOLN
INTRAVENOUS | Status: AC
  Filled 2019-02-18: qty 100

## 2019-02-18 SURGICAL SUPPLY — 116 items
ADH SKN CLS APL DERMABOND .7 (GAUZE/BANDAGES/DRESSINGS) ×2
ALLODERM 16X20 PERFORATED (Tissue) ×1 IMPLANT
ALLOGRAFT PERF 16X20 1.6+/-0.4 (Tissue) ×1 IMPLANT
APL PRP STRL LF DISP 70% ISPRP (MISCELLANEOUS) ×2
APL SKNCLS STERI-STRIP NONHPOA (GAUZE/BANDAGES/DRESSINGS)
APPLIER CLIP 11 MED OPEN (CLIP)
APPLIER CLIP 9.375 MED OPEN (MISCELLANEOUS) ×3
APR CLP MED 11 20 MLT OPN (CLIP)
APR CLP MED 9.3 20 MLT OPN (MISCELLANEOUS) ×1
BAG DECANTER FOR FLEXI CONT (MISCELLANEOUS) ×3 IMPLANT
BENZOIN TINCTURE PRP APPL 2/3 (GAUZE/BANDAGES/DRESSINGS) IMPLANT
BINDER BREAST LRG (GAUZE/BANDAGES/DRESSINGS) IMPLANT
BINDER BREAST MEDIUM (GAUZE/BANDAGES/DRESSINGS) IMPLANT
BINDER BREAST XLRG (GAUZE/BANDAGES/DRESSINGS) IMPLANT
BINDER BREAST XXLRG (GAUZE/BANDAGES/DRESSINGS) ×2 IMPLANT
BIOPATCH RED 1 DISK 7.0 (GAUZE/BANDAGES/DRESSINGS) ×2 IMPLANT
BIOPATCH RED 1IN DISK 7.0MM (GAUZE/BANDAGES/DRESSINGS) ×1
BLADE CLIPPER SURG (BLADE) IMPLANT
BLADE SURG 10 STRL SS (BLADE) ×5 IMPLANT
BLADE SURG 15 STRL LF DISP TIS (BLADE) ×1 IMPLANT
BLADE SURG 15 STRL SS (BLADE) ×3
BNDG ELASTIC 6X5.8 VLCR STR LF (GAUZE/BANDAGES/DRESSINGS) IMPLANT
BNDG GAUZE ELAST 4 BULKY (GAUZE/BANDAGES/DRESSINGS) IMPLANT
CANISTER SUCT 1200ML W/VALVE (MISCELLANEOUS) ×5 IMPLANT
CHLORAPREP W/TINT 26 (MISCELLANEOUS) ×5 IMPLANT
CLIP APPLIE 11 MED OPEN (CLIP) IMPLANT
CLIP APPLIE 9.375 MED OPEN (MISCELLANEOUS) ×1 IMPLANT
CLOSURE WOUND 1/2 X4 (GAUZE/BANDAGES/DRESSINGS)
COUNTER NEEDLE 1200 MAGNETIC (NEEDLE) IMPLANT
COVER BACK TABLE REUSABLE LG (DRAPES) ×3 IMPLANT
COVER MAYO STAND REUSABLE (DRAPES) ×5 IMPLANT
COVER PROBE W GEL 5X96 (DRAPES) ×3 IMPLANT
COVER WAND RF STERILE (DRAPES) IMPLANT
DECANTER SPIKE VIAL GLASS SM (MISCELLANEOUS) IMPLANT
DERMABOND ADVANCED (GAUZE/BANDAGES/DRESSINGS) ×4
DERMABOND ADVANCED .7 DNX12 (GAUZE/BANDAGES/DRESSINGS) ×1 IMPLANT
DRAIN CHANNEL 15F RND FF W/TCR (WOUND CARE) ×2 IMPLANT
DRAIN CHANNEL 19F RND (DRAIN) ×3 IMPLANT
DRAPE HALF SHEET 70X43 (DRAPES) ×5 IMPLANT
DRAPE LAPAROSCOPIC ABDOMINAL (DRAPES) ×1 IMPLANT
DRAPE SPLIT 6X30 W/TAPE (DRAPES) ×3 IMPLANT
DRAPE TOP ARMCOVERS (MISCELLANEOUS) ×3 IMPLANT
DRAPE UTILITY XL STRL (DRAPES) ×5 IMPLANT
DRSG PAD ABDOMINAL 8X10 ST (GAUZE/BANDAGES/DRESSINGS) ×6 IMPLANT
DRSG TEGADERM 2-3/8X2-3/4 SM (GAUZE/BANDAGES/DRESSINGS) ×2 IMPLANT
DRSG TEGADERM 4X10 (GAUZE/BANDAGES/DRESSINGS) ×4 IMPLANT
ELECT BLADE 4.0 EZ CLEAN MEGAD (MISCELLANEOUS) ×3
ELECT COATED BLADE 2.86 ST (ELECTRODE) ×3 IMPLANT
ELECT REM PT RETURN 9FT ADLT (ELECTROSURGICAL) ×3
ELECTRODE BLDE 4.0 EZ CLN MEGD (MISCELLANEOUS) ×1 IMPLANT
ELECTRODE REM PT RTRN 9FT ADLT (ELECTROSURGICAL) ×1 IMPLANT
EVACUATOR SILICONE 100CC (DRAIN) ×5 IMPLANT
EXPANDER TISSUE FV FOURTE 500 (Prosthesis & Implant Plastic) IMPLANT
FILTER 7/8 IN (FILTER) IMPLANT
GAUZE SPONGE 4X4 12PLY STRL (GAUZE/BANDAGES/DRESSINGS) ×3 IMPLANT
GLOVE BIO SURGEON STRL SZ 6 (GLOVE) ×8 IMPLANT
GLOVE BIO SURGEON STRL SZ 6.5 (GLOVE) IMPLANT
GLOVE BIO SURGEON STRL SZ7 (GLOVE) ×2 IMPLANT
GLOVE BIO SURGEONS STRL SZ 6.5 (GLOVE)
GLOVE BIOGEL PI IND STRL 6.5 (GLOVE) IMPLANT
GLOVE BIOGEL PI IND STRL 7.0 (GLOVE) IMPLANT
GLOVE BIOGEL PI IND STRL 7.5 (GLOVE) IMPLANT
GLOVE BIOGEL PI INDICATOR 6.5 (GLOVE)
GLOVE BIOGEL PI INDICATOR 7.0 (GLOVE) ×6
GLOVE BIOGEL PI INDICATOR 7.5 (GLOVE) ×2
GLOVE ECLIPSE 6.5 STRL STRAW (GLOVE) ×2 IMPLANT
GLOVE SURG SIGNA 7.5 PF LTX (GLOVE) ×1 IMPLANT
GLOVE SURG SYN 7.5  E (GLOVE) ×2
GLOVE SURG SYN 7.5 E (GLOVE) ×1 IMPLANT
GLOVE SURG SYN 7.5 PF PI (GLOVE) IMPLANT
GOWN STRL REUS W/ TWL LRG LVL3 (GOWN DISPOSABLE) ×2 IMPLANT
GOWN STRL REUS W/ TWL XL LVL3 (GOWN DISPOSABLE) ×1 IMPLANT
GOWN STRL REUS W/TWL LRG LVL3 (GOWN DISPOSABLE) ×9
GOWN STRL REUS W/TWL XL LVL3 (GOWN DISPOSABLE) ×3
KIT FILL SYSTEM UNIVERSAL (SET/KITS/TRAYS/PACK) IMPLANT
KIT MARKER MARGIN INK (KITS) IMPLANT
MARKER SKIN DUAL TIP RULER LAB (MISCELLANEOUS) IMPLANT
NDL HYPO 25X1 1.5 SAFETY (NEEDLE) ×1 IMPLANT
NDL SAFETY ECLIPSE 18X1.5 (NEEDLE) ×1 IMPLANT
NEEDLE HYPO 18GX1.5 SHARP (NEEDLE) ×3
NEEDLE HYPO 25X1 1.5 SAFETY (NEEDLE) ×3 IMPLANT
NS IRRIG 1000ML POUR BTL (IV SOLUTION) ×3 IMPLANT
PACK BASIN DAY SURGERY FS (CUSTOM PROCEDURE TRAY) ×3 IMPLANT
PENCIL BUTTON HOLSTER BLD 10FT (ELECTRODE) ×1 IMPLANT
PENCIL SMOKE EVACUATOR (MISCELLANEOUS) ×3 IMPLANT
PIN SAFETY STERILE (MISCELLANEOUS) ×3 IMPLANT
PUNCH BIOPSY DERMAL 4MM (MISCELLANEOUS) IMPLANT
SLEEVE SCD COMPRESS KNEE MED (MISCELLANEOUS) ×3 IMPLANT
SPONGE LAP 18X18 RF (DISPOSABLE) ×8 IMPLANT
SPONGE LAP 4X18 RFD (DISPOSABLE) ×3 IMPLANT
STAPLER VISISTAT 35W (STAPLE) ×3 IMPLANT
STRIP CLOSURE SKIN 1/2X4 (GAUZE/BANDAGES/DRESSINGS) IMPLANT
SUT CHROMIC 4 0 PS 2 18 (SUTURE) ×8 IMPLANT
SUT ETHILON 2 0 FS 18 (SUTURE) ×3 IMPLANT
SUT MNCRL AB 4-0 PS2 18 (SUTURE) ×5 IMPLANT
SUT PDS AB 2-0 CT2 27 (SUTURE) IMPLANT
SUT SILK 2 0 SH (SUTURE) ×3 IMPLANT
SUT VIC AB 3-0 SH 27 (SUTURE) ×6
SUT VIC AB 3-0 SH 27X BRD (SUTURE) ×1 IMPLANT
SUT VICRYL 0 CT-2 (SUTURE) ×4 IMPLANT
SUT VICRYL 3-0 CR8 SH (SUTURE) ×1 IMPLANT
SUT VICRYL 4-0 PS2 18IN ABS (SUTURE) ×4 IMPLANT
SUT VLOC 180 0 24IN GS25 (SUTURE) ×2 IMPLANT
SYR 10ML LL (SYRINGE) ×3 IMPLANT
SYR 50ML LL SCALE MARK (SYRINGE) ×3 IMPLANT
SYR BULB IRRIGATION 50ML (SYRINGE) ×5 IMPLANT
SYR CONTROL 10ML LL (SYRINGE) ×3 IMPLANT
TAPE MEASURE VINYL STERILE (MISCELLANEOUS) IMPLANT
TISSUE EXPNDR FV FOURTE 500 (Prosthesis & Implant Plastic) ×3 IMPLANT
TOWEL GREEN STERILE FF (TOWEL DISPOSABLE) ×5 IMPLANT
TRAY DSU PREP LF (CUSTOM PROCEDURE TRAY) ×2 IMPLANT
TRAY FOLEY W/BAG SLVR 14FR LF (SET/KITS/TRAYS/PACK) IMPLANT
TUBE CONNECTING 20'X1/4 (TUBING) ×2
TUBE CONNECTING 20X1/4 (TUBING) ×3 IMPLANT
UNDERPAD 30X30 (UNDERPADS AND DIAPERS) ×6 IMPLANT
YANKAUER SUCT BULB TIP NO VENT (SUCTIONS) ×5 IMPLANT

## 2019-02-18 NOTE — Op Note (Signed)
Operative Note   DATE OF OPERATION: 8.21.20  LOCATION: [facility]-observation  SURGICAL DIVISION: Plastic Surgery  PREOPERATIVE DIAGNOSES:  Left breast cancer overlapping sites ER+  POSTOPERATIVE DIAGNOSES:  same  PROCEDURE:  1. Left breast reconstruction with tissue expander 2. Acellular dermis (Alloderm) for breast reconstruction 300 cm2  SURGEON: Irene Limbo MD MBA  ASSISTANT: none  ANESTHESIA:  General.   EBL: 125 ml for entire procedure  COMPLICATIONS: None immediate.   INDICATIONS FOR PROCEDURE:  The patient, Tracy Knight, is a 58 y.o. female born on 04-Jul-1960, is here for immediate prepectoral expander acellular dermis reconstruction following left skin reduction pattern mastectomy.   FINDINGS: Natrelle 133S-FV-13-T 500 ml tissue expander placed initial fill volume 420 ml air. SN ID:[id number]  DESCRIPTION OF PROCEDURE:  The patient was marked with the patient in the preoperative area to mark sternal notch, chest midline, anterior axillary lines and inframammary folds. Patient was marked for skin reduction mastectomy with most superior portion nipple areola marked on breast meridian. Vertical limbs marked by breast displacement and set at 9cm length. The patient was taken to the operating room. SCDs were placed and IV antibiotics were given. The patient's operative site was prepped and draped in a sterile fashion. A time out was performed and all information was confirmed to be correct. In supine position, the lateral limbs for resection marked and area over lower pole preserved as inferiorly based dermal pedicle. Skin de epithelialized in this area.I assisted in mastectomyand sentinel LN sampling withexposure and retraction.  Following completion of mastectomy, the cavity was irrigated with solution containing Ancef, gentamicin, and bacitracin. Hemostasis was ensured. A 19 Fr drain was placed in subcutaneous position laterally anda 15 Fr drain placed along  inframammary fold. Eachsecured to skin with 2-0 nylon. Cavity irrigated with Betadine.The tissue expanderwasprepared on back table prior in insertion. The expander was filled with air to415ml. Perforated acellular dermis was draped over anterior surface expander. The ADM was then secured to itself over posterior surface of expander with 4-0 chromic. Redundant folds acellular dermis excised so that the ADM lie flat without folds over air filled expander.The expander was secured to medial insertion pectoralis with a 0 vicryl.The lateral tab also secured to pectoralis muscle with 0-vicryl. The ADM was secured to pectoralis muscle and chest wall along inferior border at inframammary foldwith 0 V lock suture.Laterally the mastectomy flap over posterior axillary line was advanced anteriorly and the subcutaneous tissue and superficial fascia was secured to pectoralis muscle and acellular dermis with 0-vicryl. The inferiorly based dermal pedicle was redraped superiorly over expander and acellular dermis and secured to pectoralis with interrupted 0-vicryl. Skin closure completedwith 3-0 vicryl in fascial layer and 4-0 vicryl in dermis. Skin closure completed with 4-0 monocryl subcuticular and tissue adhesive.  Tegaderms applied over skin flaps. Dry dressing and breast binder applied.The patient was allowed to wakefromanesthesia, extubatedand taken to the recovery room in satisfactory condition.  SPECIMENS: none  DRAINS: 15 and 19 Fr JP in left breast reconstruction subcutaneous  Irene Limbo, MD [facility] Surgery [phone], pin [phone]

## 2019-02-18 NOTE — Transfer of Care (Signed)
Immediate Anesthesia Transfer of Care Note  Patient: Tracy Knight  Procedure(s) Performed: LEFT MASTECTOMY WITH LEFT AXILLARY SENTINEL LYMPH NODE BIOPSY (Left Breast) LEFT BREAST RECONSTRUCTION WITH PLACEMENT OF TISSUE EXPANDER AND ALLODERM (Left Breast)  Patient Location: PACU  Anesthesia Type:GA combined with regional for post-op pain  Level of Consciousness: sedated and responds to stimulation  Airway & Oxygen Therapy: Patient Spontanous Breathing and Patient connected to face mask oxygen  Post-op Assessment: Report given to RN and Post -op Vital signs reviewed and stable  Post vital signs: Reviewed and stable  Last Vitals:  Vitals Value Taken Time  BP    Temp    Pulse 66 02/18/19 1526  Resp 16 02/18/19 1526  SpO2 97 % 02/18/19 1526  Vitals shown include unvalidated device data.  Last Pain:  Vitals:   02/18/19 1103  TempSrc: Oral  PainSc: 0-No pain      Patients Stated Pain Goal: 5 ([id number])  Complications: No apparent anesthesia complications

## 2019-02-18 NOTE — Anesthesia Preprocedure Evaluation (Signed)
Anesthesia Evaluation  Patient identified by MRN, date of birth, ID band Patient awake    Reviewed: Allergy & Precautions, NPO status , Patient's Chart, lab work & pertinent test results  Airway Mallampati: II  TM Distance: >3 FB Neck ROM: Full    Dental no notable dental hx. (+) Dental Advisory Given   Pulmonary asthma ,    Pulmonary exam normal breath sounds clear to auscultation       Cardiovascular hypertension, Normal cardiovascular exam Rhythm:Regular Rate:Normal     Neuro/Psych negative neurological ROS  negative psych ROS   GI/Hepatic negative GI ROS, Neg liver ROS,   Endo/Other  diabetes  Renal/GU negative Renal ROS     Musculoskeletal negative musculoskeletal ROS (+)   Abdominal   Peds  Hematology negative hematology ROS (+)   Anesthesia Other Findings   Reproductive/Obstetrics                             Anesthesia Physical Anesthesia Plan  ASA: II  Anesthesia Plan: General   Post-op Pain Management:    Induction: Intravenous  PONV Risk Score and Plan: 4 or greater and Ondansetron, Dexamethasone and Treatment may vary due to age or medical condition  Airway Management Planned: LMA  Additional Equipment: None  Intra-op Plan:   Post-operative Plan: Extubation in OR  Informed Consent: I have reviewed the patients History and Physical, chart, labs and discussed the procedure including the risks, benefits and alternatives for the proposed anesthesia with the patient or authorized representative who has indicated his/her understanding and acceptance.     Dental advisory given  Plan Discussed with: CRNA  Anesthesia Plan Comments:         Anesthesia Quick Evaluation

## 2019-02-18 NOTE — Anesthesia Postprocedure Evaluation (Signed)
Anesthesia Post Note  Patient: Tracy Knight  Procedure(s) Performed: LEFT MASTECTOMY WITH LEFT AXILLARY SENTINEL LYMPH NODE BIOPSY (Left Breast) LEFT BREAST RECONSTRUCTION WITH PLACEMENT OF TISSUE EXPANDER AND ALLODERM (Left Breast)     Patient location during evaluation: PACU Anesthesia Type: General and Regional Level of consciousness: awake and alert Pain management: pain level controlled Vital Signs Assessment: post-procedure vital signs reviewed and stable Respiratory status: spontaneous breathing, nonlabored ventilation, respiratory function stable and patient connected to nasal cannula oxygen Cardiovascular status: blood pressure returned to baseline and stable Postop Assessment: no apparent nausea or vomiting Anesthetic complications: no    Last Vitals:  Vitals:   02/18/19 1645 02/18/19 1700  BP: (!) 150/88 (!) 143/81  Pulse: 65 70  Resp: 10 16  Temp:    SpO2: 94% 100%    Last Pain:  Vitals:   02/18/19 1700  TempSrc:   PainSc: 0-No pain                 Deren Degrazia P Aasim Restivo

## 2019-02-18 NOTE — Anesthesia Procedure Notes (Signed)
Procedure Name: Intubation Date/Time: 02/18/2019 12:44 PM Performed by: Myna Bright, CRNA Pre-anesthesia Checklist: Patient identified, Emergency Drugs available, Suction available and Patient being monitored Patient Re-evaluated:Patient Re-evaluated prior to induction Oxygen Delivery Method: Circle system utilized Preoxygenation: Pre-oxygenation with 100% oxygen Induction Type: IV induction Ventilation: Mask ventilation without difficulty Laryngoscope Size: Mac and 3 Grade View: Grade I Tube type: Oral Tube size: 7.0 mm Number of attempts: 1 Airway Equipment and Method: Stylet Placement Confirmation: ETT inserted through vocal cords under direct vision,  positive ETCO2 and breath sounds checked- equal and bilateral Secured at: 20 cm Tube secured with: Tape Dental Injury: Teeth and Oropharynx as per pre-operative assessment

## 2019-02-18 NOTE — Assessment & Plan Note (Signed)
12/17/2018:Screening mammogram detected 1.5cm left breast mass, left breast distortion, and cortical thickening in left axillary lymph node. Biopsy confirmed invasive mammary carcinoma with mammary carcinoma in situ, grade 2-3, HER2- (1+), ER+ (100%), PR+ (100%), Ki67 10%. No evidence of carcinoma in the left axillary lymph node.  T1CN0 stage Ia  Recommendations: 1.  Mastectomy with sentinel lymph node biopsy followed by 2. Oncotype DX testing to determine if chemotherapy would be of any benefit followed by 3. Adjuvant antiestrogen therapy ---------------------------------------------------------------------------------------------------------------------------------- 02/17/2019: Mastectomy  Pathology counseling: I discussed the final pathology report of the patient provided  a copy of this report. I discussed the margins as well as lymph node surgeries. We also discussed the final staging along with previously performed ER/PR and HER-2/neu testing.  Recommendation: 1.  Oncotype testing 2.  Followed by adjuvant antiestrogen therapy.(Anastrozole was started neoadjuvantly until the time of surgery.)  Return to clinic based upon Oncotype test results.

## 2019-02-18 NOTE — Op Note (Addendum)
02/18/2019  1:54 PM  PATIENT:  Tracy Knight, 58 y.o., female, MRN: [id number]  PREOP DIAGNOSIS:  LEFT BREAST CANCER  POSTOP DIAGNOSIS:   Left breast cancer, 2 and 5 o'clock position (T1, N0)  PROCEDURE:   Procedure(s):  LEFT MASTECTOMY WITH LEFT AXILLARY SENTINEL LYMPH NODE BIOPSY, deep sentinel lymph node biopsy - [name] LEFT BREAST RECONSTRUCTION WITH PLACEMENT OF TISSUE EXPANDER AND ALLODERM - [place]  SURGEON:   Alphonsa Overall, M.D.  ASSISTANT:   B. Iran Planas, M.D.  ANESTHESIA:  General  Anesthesiologist: Murvin Natal, MD; Nolon Nations, MD CRNA: Lieutenant Diego, CRNA; Myna Bright, CRNA  General  ASA:  3  EBL:  125  ml  BLOOD ADMINISTERED: none  DRAINS: To be placed by Dr. Iran Planas  LOCAL MEDICATIONS USED:   Left pectoral block  SPECIMEN:   Left mastectomy (suture lateral), left axillary sentinel lymph node (counts - 35, background - 0, not blue  COUNTS CORRECT:  YES  INDICATIONS FOR PROCEDURE:  Tracy Knight is a 58 y.o. (DOB: 01/21/1961) AA female whose primary care physician is Sharilyn Sites, MD and comes for left mastectomy with left axillary sentinel lymph node biopsy and immediate left breast reconstruction.   She was seen at the [facility] [place] with Drs. [place] and [place].  Because she had two foci of left breast cancer in different quadrants, we discussed proceeding with a left mastectomy   The indications and risks of the surgery were explained to the patient.  The risks include, but are not limited to, infection, bleeding, and nerve injury.   In the holding area, her left areola was injected with 1 millicurie of Technitium Sulfur Colloid.  OPERATIVE NOTE;  The patient was taken to room # 7 at [facility] where she underwent a general anesthesia  supervised by Anesthesiologist: Murvin Natal, MD; Nolon Nations, MD CRNA: Lieutenant Diego, CRNA; Decarlo, Arther Dames, CRNA. Her left breast and axilla were prepped with ChloraPrep and sterilely draped.     A time-out and the surgical check list was reviewed.    I injected about 0.5 mL of 40% methylene blue around her 1.5 areola.   Dr. Carey Bullocks marked a triangular area around and including the areola in the left breast.  I developed skin flaps medially to the lateral edge of the sternum, inferiorly to the investing fascia of the rectus abdominus muscle, laterally to the anterior edge of the latissimus dorsi muscle, and superiorly to about 2 finger breaths below the clavicle.  The breast was reflected off the pectoralis muscle from medial to lateral.  The lateral attachments in the left axilla were divided and the breast removed.  A long suture was placed on the lateral aspect of the breast.   After removing the breast, I searched to the left axillary sentinel lymph node.  There was a node with counts of 35, the background was 0.  The lymph node was not blue.  The lymph node was dissected out and sent as a separate specimen.   Dr. Iran Planas assisted during the surgery.   Dr. Iran Planas started the breast reconstruction on completion of the mastectomy and will dictate that portion of the operation.  Alphonsa Overall, MD, [facility] Surgery Pager: [phone] Office phone:  [phone]

## 2019-02-18 NOTE — Interval H&P Note (Signed)
History and Physical Interval Note:  02/18/2019 12:23 PM  Tracy Knight  has presented today for surgery, with the diagnosis of LEFT BREAST CANCER.  The various methods of treatment have been discussed with the patient and family.  Her husband, [name], is with her.  After consideration of risks, benefits and other options for treatment, the patient has consented to  Procedure(s): LEFT MASTECTOMY WITH LEFT AXILLARY SENTINEL LYMPH NODE BIOPSY (Left) LEFT BREAST RECONSTRUCTION WITH PLACEMENT OF TISSUE EXPANDER AND ALLODERM (Left) as a surgical intervention.  The patient's history has been reviewed, patient examined, no change in status, stable for surgery.  I have reviewed the patient's chart and labs.  Questions were answered to the patient's satisfaction.     Shann Medal

## 2019-02-18 NOTE — Anesthesia Procedure Notes (Signed)
Anesthesia Regional Block: Pectoralis block   Pre-Anesthetic Checklist: ,, timeout performed, Correct Patient, Correct Site, Correct Laterality, Correct Procedure, Correct Position, site marked, Risks and benefits discussed,  Surgical consent,  Pre-op evaluation,  At surgeon's request and post-op pain management  Laterality: Left  Prep: chloraprep       Needles:   Needle Type: Stimiplex     Needle Length: 9cm      Additional Needles:   Procedures:,,,, ultrasound used (permanent image in chart),,,,  Narrative:  Start time: 02/18/2019 11:45 AM End time: 02/18/2019 11:52 AM Injection made incrementally with aspirations every 5 mL.  Performed by: Personally  Anesthesiologist: Nolon Nations, MD  Additional Notes: Patient tolerated well. Good fascial spread noted.

## 2019-02-18 NOTE — Discharge Instructions (Signed)

## 2019-02-18 NOTE — Interval H&P Note (Signed)
History and Physical Interval Note:  02/18/2019 11:50 AM  Tracy Knight  has presented today for surgery, with the diagnosis of LEFT BREAST CANCER.  The various methods of treatment have been discussed with the patient and family. After consideration of risks, benefits and other options for treatment, the patient has consented to  Procedure(s): LEFT MASTECTOMY WITH LEFT AXILLARY SENTINEL LYMPH NODE BIOPSY (Left) LEFT BREAST RECONSTRUCTION WITH PLACEMENT OF TISSUE EXPANDER AND ALLODERM (Left) as a surgical intervention.  The patient's history has been reviewed, patient examined, no change in status, stable for surgery.  I have reviewed the patient's chart and labs.  Questions were answered to the patient's satisfaction.     Arnoldo Hooker Tracy Knight

## 2019-02-18 NOTE — Progress Notes (Signed)
nuc med at bedside, injection completed emotional support provided

## 2019-02-18 NOTE — Progress Notes (Signed)
Assisted Dr. Germeroth with left, ultrasound guided, pectoralis block. Side rails up, monitors on throughout procedure. See vital signs in flow sheet. Tolerated Procedure well. 

## 2019-02-19 DIAGNOSIS — E785 Hyperlipidemia, unspecified: Secondary | ICD-10-CM | POA: Diagnosis not present

## 2019-02-19 DIAGNOSIS — C50412 Malignant neoplasm of upper-outer quadrant of left female breast: Secondary | ICD-10-CM | POA: Diagnosis not present

## 2019-02-19 DIAGNOSIS — Z170 Estrogen receptor positive status [ER+]: Secondary | ICD-10-CM | POA: Diagnosis not present

## 2019-02-19 DIAGNOSIS — C773 Secondary and unspecified malignant neoplasm of axilla and upper limb lymph nodes: Secondary | ICD-10-CM | POA: Diagnosis not present

## 2019-02-19 DIAGNOSIS — I10 Essential (primary) hypertension: Secondary | ICD-10-CM | POA: Diagnosis not present

## 2019-02-19 DIAGNOSIS — J45909 Unspecified asthma, uncomplicated: Secondary | ICD-10-CM | POA: Diagnosis not present

## 2019-02-19 DIAGNOSIS — E119 Type 2 diabetes mellitus without complications: Secondary | ICD-10-CM | POA: Diagnosis not present

## 2019-02-19 DIAGNOSIS — C50512 Malignant neoplasm of lower-outer quadrant of left female breast: Secondary | ICD-10-CM | POA: Diagnosis not present

## 2019-02-19 LAB — GLUCOSE, CAPILLARY: Glucose-Capillary: 138 mg/dL — ABNORMAL HIGH (ref 70–99)

## 2019-02-19 NOTE — Discharge Summary (Signed)
Physician Discharge Summary  Patient ID: Tracy Knight MRN: [id number] DOB/AGE: Mar 02, 1961 58 y.o.  Admit date: 02/18/2019 Discharge date: 02/19/2019  Admission Diagnoses: Left breast cancer  Discharge Diagnoses:  Active Problems:   Breast cancer, left breast [facility])   Discharged Condition: stable  Hospital Course: Post operatively patient did well tolerating diet and pain controlled with oral medication. Patient ambulatory with minimal assist. Instructed on drain care.  Treatments: surgery: left skin reduction mastectomy with SLN, left breast reconstruction with tissue expander acellular dermis 8.21.20  Discharge Exam: Blood pressure 126/71, pulse 83, temperature 97.7 F (36.5 C), resp. rate 16, height 5\' 5"  (1.651 m), weight 90.4 kg, SpO2 98 %. Incision/Wound: chest soft incisions dry intact drains serosanguinous Tegaderms in place  Disposition: Discharge disposition: 01-Home or Self Care       Discharge Instructions    Call MD for:  redness, tenderness, or signs of infection (pain, swelling, bleeding, redness, odor or green/yellow discharge around incision site)   Complete by: As directed    Call MD for:  temperature >100.5   Complete by: As directed    Discharge instructions   Complete by: As directed    Ok to remove dressings and shower am 8.23.20. Soap and water ok, pat Tegaderms dry. Do not remove Tegaderms. No creams or ointments over incisions. Do not let drains dangle in shower, attach to lanyard or similar.Strip and record drains twice daily and bring log to clinic visit.  Breast binder or soft compression bra all other times.  Ok to raise arms above shoulders for bathing and dressing.  No house yard work or exercise until cleared by MD.   Recommend ibuprofen with meals as directed to aid with pain control. Also ok to take Tylenol for pain. Recommend Miralax or Dulcolax as needed for constipation.   Driving Restrictions   Complete by: As directed    No driving  for 2 weeks then no driving if taking narcotics   Lifting restrictions   Complete by: As directed    No lifting > 5 lbs until cleared by MD   Resume previous diet   Complete by: As directed      Allergies as of 02/19/2019      Reactions   Carvedilol Other (See Comments)   Causes inc. In wheezing due to asthma      Medication List    TAKE these medications   anastrozole 1 MG tablet Commonly known as: ARIMIDEX Take 1 tablet (1 mg total) by mouth daily.   cetirizine 10 MG tablet Commonly known as: ZYRTEC Take 10 mg by mouth daily.   cholecalciferol 1000 units tablet Commonly known as: VITAMIN D Take 1,000 Units by mouth 3 (three) times a week.   diltiazem 360 MG 24 hr capsule Commonly known as: TIAZAC TAKE 1 CAPSULE BY MOUTH DAILY   fish oil-omega-3 fatty acids 1000 MG capsule Take 1,000 mg by mouth daily.   guaiFENesin 600 MG 12 hr tablet Commonly known as: MUCINEX Take 600 mg by mouth daily as needed (for congestion/cough).   ibuprofen 200 MG tablet Commonly known as: ADVIL Take 200-400 mg by mouth every 8 (eight) hours as needed (for pain/headache.).   levalbuterol 1.25 MG/3ML nebulizer solution Commonly known as: XOPENEX Inhale 1.25 mg into the lungs every 6 (six) hours as needed. For wheezing/shortness of breath   losartan 50 MG tablet Commonly known as: COZAAR TAKE 1 TABLET(50 MG) BY MOUTH DAILY   metFORMIN 1000 MG tablet Commonly known as: GLUCOPHAGE Take  1,000 mg by mouth daily before lunch.   methocarbamol 500 MG tablet Commonly known as: Robaxin Take 1 tablet (500 mg total) by mouth every 8 (eight) hours as needed for muscle spasms.   montelukast 10 MG tablet Commonly known as: SINGULAIR Take 10 mg by mouth daily before lunch.   oxyCODONE 5 MG immediate release tablet Commonly known as: Roxicodone Take 1 tablet (5 mg total) by mouth every 4 (four) hours as needed.   pravastatin 40 MG tablet Commonly known as: PRAVACHOL TAKE 1 TABLET(40 MG)  BY MOUTH DAILY   ProAir HFA 108 (90 Base) MCG/ACT inhaler Generic drug: albuterol INHALE 2 PUFFS INTO THE LUNGS Q 4 H PRN   Slow Release Iron 45 MG Tbcr Generic drug: Ferrous Sulfate Dried Take 45 mg by mouth daily.   sulfamethoxazole-trimethoprim 800-160 MG tablet Commonly known as: BACTRIM DS Take 1 tablet by mouth 2 (two) times daily.   triamterene-hydrochlorothiazide 37.5-25 MG tablet Commonly known as: MAXZIDE-25 Take 1 tablet by mouth daily.   Vitamin B-12 2500 MCG Subl Place 2,500 mcg under the tongue daily.      Follow-up Information    Irene Limbo, MD In 1 week.   Specialty: Plastic Surgery Why: as scheduled Contact information: [address] [place] [address] [id number]        Alphonsa Overall, MD. Schedule an appointment as soon as possible for a visit in 2 weeks.   Specialty: General Surgery Contact information: [place] [address] [phone]           Signed: Irene Limbo 02/19/2019, 7:56 AM

## 2019-02-21 ENCOUNTER — Encounter (HOSPITAL_BASED_OUTPATIENT_CLINIC_OR_DEPARTMENT_OTHER): Payer: Self-pay | Admitting: Surgery

## 2019-02-24 NOTE — Progress Notes (Signed)
Patient Care Team: Sharilyn Sites, MD as PCP - General (Family Medicine) Croitoru, Dani Gobble, MD as PCP - Cardiology (Cardiology) Mauro Kaufmann, RN as Oncology Nurse Navigator Rockwell Germany, RN as Oncology Nurse Navigator Alphonsa Overall, MD as Consulting Physician (General Surgery) Nicholas Lose, MD as Consulting Physician (Hematology and Oncology) Kyung Rudd, MD as Consulting Physician (Radiation Oncology) Irene Limbo, MD as Consulting Physician (Plastic Surgery)  DIAGNOSIS:    ICD-10-CM   1. Malignant neoplasm of overlapping sites of left breast in female, estrogen receptor positive ([place])  C50.812    Z17.0     SUMMARY OF ONCOLOGIC HISTORY: Oncology History  Malignant neoplasm of overlapping sites of left breast in female, estrogen receptor positive ([place])  12/17/2018 Initial Diagnosis   Screening mammogram detected 1.5cm left breast mass, left breast distortion, and cortical thickening in left axillary lymph node. Biopsy confirmed invasive mammary carcinoma with mammary carcinoma in situ, grade 2-3, HER2- (1+), ER+ (100%), PR+ (100%), Ki67 10%. No evidence of carcinoma in the left axillary lymph node.    12/22/2018 Cancer Staging   Staging form: Breast, AJCC 8th Edition - Clinical stage from 12/22/2018: Stage IA (cT1c, cN0, cM0, G3, ER+, PR+, HER2-) - Signed by Nicholas Lose, MD on 12/22/2018   02/18/2019 Surgery   Left mastectomy and sentinel lymph node biopsy [name], [place]): invasive lobular carcinoma, grade 2, 1.6cm, clear margins. One lymph node positive for metastatic carcinoma, 0.5cm, with extranodal extension.      CHIEF COMPLIANT: Follow-up s/p right mastectomy to review pathology report  INTERVAL HISTORY: Tracy Knight is a 58 y.o. with above-mentioned history of left breast cancer who was on neoadjuvant anastrozole therapy prior to undergoing a left mastectomy and sentinel lymph node biopsy on 02/18/19 with Dr. [name] and Dr. Iran Planas. Pathology confirmed invasive  lobular carcinoma, grade 2, 1.6cm, clear margins. One lymph node was positive for metastatic carcinoma, 0.5cm, with extranodal extension. She presents to the clinic today to review the pathology report and discuss further treatment.    REVIEW OF SYSTEMS:   Constitutional: Denies fevers, chills or abnormal weight loss Eyes: Denies blurriness of vision Ears, nose, mouth, throat, and face: Denies mucositis or sore throat Respiratory: Denies cough, dyspnea or wheezes Cardiovascular: Denies palpitation, chest discomfort Gastrointestinal: Denies nausea, heartburn or change in bowel habits Skin: Denies abnormal skin rashes Lymphatics: Denies new lymphadenopathy or easy bruising Neurological: Denies numbness, tingling or new weaknesses Behavioral/Psych: Mood is stable, no new changes  Extremities: No lower extremity edema Breast: s/p left mastectomy All other systems were reviewed with the patient and are negative.  I have reviewed the past medical history, past surgical history, social history and family history with the patient and they are unchanged from previous note.  ALLERGIES:  is allergic to carvedilol.  MEDICATIONS:  Current Outpatient Medications  Medication Sig Dispense Refill  . anastrozole (ARIMIDEX) 1 MG tablet Take 1 tablet (1 mg total) by mouth daily. 90 tablet 3  . cetirizine (ZYRTEC) 10 MG tablet Take 10 mg by mouth daily.    . cholecalciferol (VITAMIN D) 1000 UNITS tablet Take 1,000 Units by mouth 3 (three) times a week.     . Cyanocobalamin (VITAMIN B-12) 2500 MCG SUBL Place 2,500 mcg under the tongue daily.    Marland Kitchen diltiazem (TIAZAC) 360 MG 24 hr capsule TAKE 1 CAPSULE BY MOUTH DAILY 90 capsule 2  . Ferrous Sulfate Dried (SLOW RELEASE IRON) 45 MG TBCR Take 45 mg by mouth daily.    . fish oil-omega-3 fatty  acids 1000 MG capsule Take 1,000 mg by mouth daily.     Marland Kitchen guaiFENesin (MUCINEX) 600 MG 12 hr tablet Take 600 mg by mouth daily as needed (for congestion/cough).    Marland Kitchen  ibuprofen (ADVIL,MOTRIN) 200 MG tablet Take 200-400 mg by mouth every 8 (eight) hours as needed (for pain/headache.).    Marland Kitchen levalbuterol (XOPENEX) 1.25 MG/3ML nebulizer solution Inhale 1.25 mg into the lungs every 6 (six) hours as needed. For wheezing/shortness of breath  3  . losartan (COZAAR) 50 MG tablet TAKE 1 TABLET(50 MG) BY MOUTH DAILY 90 tablet 0  . metFORMIN (GLUCOPHAGE) 1000 MG tablet Take 1,000 mg by mouth daily before lunch.    . methocarbamol (ROBAXIN) 500 MG tablet Take 1 tablet (500 mg total) by mouth every 8 (eight) hours as needed for muscle spasms. 30 tablet 0  . montelukast (SINGULAIR) 10 MG tablet Take 10 mg by mouth daily before lunch.     . oxyCODONE (ROXICODONE) 5 MG immediate release tablet Take 1 tablet (5 mg total) by mouth every 4 (four) hours as needed. 40 tablet 0  . pravastatin (PRAVACHOL) 40 MG tablet TAKE 1 TABLET(40 MG) BY MOUTH DAILY 90 tablet 3  . PROAIR HFA 108 (90 Base) MCG/ACT inhaler INHALE 2 PUFFS INTO THE LUNGS Q 4 H PRN  2  . sulfamethoxazole-trimethoprim (BACTRIM DS) 800-160 MG tablet Take 1 tablet by mouth 2 (two) times daily. 12 tablet 0  . triamterene-hydrochlorothiazide (MAXZIDE-25) 37.5-25 MG tablet Take 1 tablet by mouth daily.     No current facility-administered medications for this visit.     PHYSICAL EXAMINATION: ECOG PERFORMANCE STATUS: 1 - Symptomatic but completely ambulatory  Vitals:   02/25/19 1141  BP: 133/75  Pulse: 96  Resp: 19  Temp: 98.7 F (37.1 C)  SpO2: 100%   Filed Weights   02/25/19 1141  Weight: 196 lb 8 oz (89.1 kg)    GENERAL: alert, no distress and comfortable SKIN: skin color, texture, turgor are normal, no rashes or significant lesions EYES: normal, Conjunctiva are pink and non-injected, sclera clear OROPHARYNX: no exudate, no erythema and lips, buccal mucosa, and tongue normal  NECK: supple, thyroid normal size, non-tender, without nodularity LYMPH: no palpable lymphadenopathy in the cervical, axillary or  inguinal LUNGS: clear to auscultation and percussion with normal breathing effort HEART: regular rate & rhythm and no murmurs and no lower extremity edema ABDOMEN: abdomen soft, non-tender and normal bowel sounds MUSCULOSKELETAL: no cyanosis of digits and no clubbing  NEURO: alert & oriented x 3 with fluent speech, no focal motor/sensory deficits EXTREMITIES: No lower extremity edema  LABORATORY DATA:  I have reviewed the data as listed CMP Latest Ref Rng & Units 02/15/2019 12/22/2018 04/06/2013  Glucose 70 - 99 mg/dL 104(H) 119(H) 98  BUN 6 - 20 mg/dL 14 24(H) 19  Creatinine 0.44 - 1.00 mg/dL 0.84 1.01(H) 0.90  Sodium 135 - 145 mmol/L 140 141 140  Potassium 3.5 - 5.1 mmol/L 3.9 3.2(L) 3.8  Chloride 98 - 111 mmol/L 101 101 98  CO2 22 - 32 mmol/L 29 30 35(H)  Calcium 8.9 - 10.3 mg/dL 10.0 10.2 10.1  Total Protein 6.5 - 8.1 g/dL - 8.3(H) 7.4  Total Bilirubin 0.3 - 1.2 mg/dL - 0.8 0.8  Alkaline Phos 38 - 126 U/L - 69 61  AST 15 - 41 U/L - 19 19  ALT 0 - 44 U/L - 18 17    Lab Results  Component Value Date   WBC 7.0 12/22/2018  HGB 12.8 12/22/2018   HCT 40.1 12/22/2018   MCV 83.9 12/22/2018   PLT 226 12/22/2018   NEUTROABS 4.0 12/22/2018    ASSESSMENT & PLAN:  Malignant neoplasm of overlapping sites of left breast in female, estrogen receptor positive ([place]) 12/17/2018:Screening mammogram detected 1.5cm left breast mass, left breast distortion, and cortical thickening in left axillary lymph node. Biopsy confirmed invasive mammary carcinoma with mammary carcinoma in situ, grade 2-3, HER2- (1+), ER+ (100%), PR+ (100%), Ki67 10%. No evidence of carcinoma in the left axillary lymph node.  T1CN0 stage Ia  Recommendations: 1.  Mastectomy with sentinel lymph node biopsy followed by 2.  MammaPrint testing to determine if chemotherapy would be of any benefit followed by 3. Adjuvant antiestrogen therapy  ---------------------------------------------------------------------------------------------------------------------------------- 02/17/2019: Left mastectomy and sentinel lymph node biopsy [name], [place]): invasive lobular carcinoma, grade 2, 1.6cm, clear margins. One lymph node positive for metastatic carcinoma, 0.5cm, with extranodal extension. 1/5 LN  Pathology counseling: I discussed the final pathology report of the patient provided  a copy of this report. I discussed the margins as well as lymph node surgeries. We also discussed the final staging along with previously performed ER/PR and HER-2/neu testing.  Recommendation: 1.  MammaPrint testing to determine if chemotherapy is benefit 2.  Adjuvant radiation therapy because of lymph node positivity 3.  Followed by adjuvant antiestrogen therapy.(Anastrozole was started neoadjuvantly until the time of surgery.)  Return to clinic based upon MammaPrint results.    No orders of the defined types were placed in this encounter.  The patient has a good understanding of the overall plan. she agrees with it. she will call with any problems that may develop before the next visit here.  Nicholas Lose, MD 02/25/2019  Julious Oka Dorshimer am acting as scribe for Dr. Nicholas Lose.  I have reviewed the above documentation for accuracy and completeness, and I agree with the above.

## 2019-02-25 ENCOUNTER — Inpatient Hospital Stay: Payer: BC Managed Care – PPO | Attending: Hematology and Oncology | Admitting: Hematology and Oncology

## 2019-02-25 ENCOUNTER — Telehealth: Payer: Self-pay | Admitting: *Deleted

## 2019-02-25 ENCOUNTER — Other Ambulatory Visit: Payer: Self-pay

## 2019-02-25 DIAGNOSIS — Z791 Long term (current) use of non-steroidal anti-inflammatories (NSAID): Secondary | ICD-10-CM | POA: Diagnosis not present

## 2019-02-25 DIAGNOSIS — C50812 Malignant neoplasm of overlapping sites of left female breast: Secondary | ICD-10-CM

## 2019-02-25 DIAGNOSIS — Z170 Estrogen receptor positive status [ER+]: Secondary | ICD-10-CM | POA: Diagnosis not present

## 2019-02-25 DIAGNOSIS — Z79811 Long term (current) use of aromatase inhibitors: Secondary | ICD-10-CM | POA: Diagnosis not present

## 2019-02-25 DIAGNOSIS — Z9012 Acquired absence of left breast and nipple: Secondary | ICD-10-CM | POA: Insufficient documentation

## 2019-02-25 DIAGNOSIS — Z79899 Other long term (current) drug therapy: Secondary | ICD-10-CM | POA: Diagnosis not present

## 2019-02-25 DIAGNOSIS — Z7984 Long term (current) use of oral hypoglycemic drugs: Secondary | ICD-10-CM | POA: Insufficient documentation

## 2019-02-25 NOTE — Telephone Encounter (Signed)
Received order for mammaprint testing. Requisition faxed to pathology and [facility] 

## 2019-03-08 ENCOUNTER — Telehealth: Payer: Self-pay | Admitting: *Deleted

## 2019-03-08 DIAGNOSIS — Z170 Estrogen receptor positive status [ER+]: Secondary | ICD-10-CM

## 2019-03-08 DIAGNOSIS — C50812 Malignant neoplasm of overlapping sites of left female breast: Secondary | ICD-10-CM

## 2019-03-08 NOTE — Telephone Encounter (Signed)
Received mammaprint results of low risk.

## 2019-03-09 DIAGNOSIS — C50812 Malignant neoplasm of overlapping sites of left female breast: Secondary | ICD-10-CM | POA: Diagnosis not present

## 2019-03-09 DIAGNOSIS — Z9012 Acquired absence of left breast and nipple: Secondary | ICD-10-CM | POA: Diagnosis not present

## 2019-03-09 DIAGNOSIS — Z170 Estrogen receptor positive status [ER+]: Secondary | ICD-10-CM | POA: Diagnosis not present

## 2019-03-09 DIAGNOSIS — T8189XA Other complications of procedures, not elsewhere classified, initial encounter: Secondary | ICD-10-CM | POA: Diagnosis not present

## 2019-03-10 ENCOUNTER — Encounter: Payer: Self-pay | Admitting: Physical Therapy

## 2019-03-10 ENCOUNTER — Ambulatory Visit
Admission: RE | Admit: 2019-03-10 | Discharge: 2019-03-10 | Disposition: A | Discharge status: Home / Self Care | Payer: BC Managed Care – PPO | Source: Ambulatory Visit | Attending: Radiation Oncology | Admitting: Radiation Oncology

## 2019-03-10 ENCOUNTER — Ambulatory Visit: Payer: BC Managed Care – PPO | Attending: Surgery | Admitting: Physical Therapy

## 2019-03-10 ENCOUNTER — Encounter: Payer: Self-pay | Admitting: Radiation Oncology

## 2019-03-10 ENCOUNTER — Other Ambulatory Visit: Payer: Self-pay

## 2019-03-10 DIAGNOSIS — Z170 Estrogen receptor positive status [ER+]: Secondary | ICD-10-CM | POA: Diagnosis not present

## 2019-03-10 DIAGNOSIS — C50812 Malignant neoplasm of overlapping sites of left female breast: Secondary | ICD-10-CM

## 2019-03-10 DIAGNOSIS — Z483 Aftercare following surgery for neoplasm: Secondary | ICD-10-CM | POA: Diagnosis not present

## 2019-03-10 DIAGNOSIS — M25612 Stiffness of left shoulder, not elsewhere classified: Secondary | ICD-10-CM | POA: Diagnosis not present

## 2019-03-10 DIAGNOSIS — R293 Abnormal posture: Secondary | ICD-10-CM | POA: Insufficient documentation

## 2019-03-10 DIAGNOSIS — C773 Secondary and unspecified malignant neoplasm of axilla and upper limb lymph nodes: Secondary | ICD-10-CM | POA: Diagnosis not present

## 2019-03-10 DIAGNOSIS — L988 Other specified disorders of the skin and subcutaneous tissue: Secondary | ICD-10-CM | POA: Diagnosis not present

## 2019-03-10 NOTE — Progress Notes (Signed)
Radiation Oncology         [phone] ________________________________  Initial Outpatient Consultation - Conducted via telephone due to current COVID-19 concerns for limiting patient exposure  I spoke with the patient to conduct this consult visit via telephone to spare the patient unnecessary potential exposure in the healthcare setting during the current COVID-19 pandemic. The patient was notified in advance and was offered a [place] meeting to allow for face to face communication but unfortunately reported that they did not have the appropriate resources/technology to support such a visit and instead preferred to proceed with a telephone consult.   ______________  Name: Tracy Knight        MRN: [id number]  Date of Service: 03/10/2019 DOB: December 28, 1960  VZ:CHYIFOY, Jenny Reichmann, MD  Nicholas Lose, MD     REFERRING PHYSICIAN: Nicholas Lose, MD   DIAGNOSIS: The encounter diagnosis was Malignant neoplasm of overlapping sites of left breast in female, estrogen receptor positive ([place]).   HISTORY OF PRESENT ILLNESS: Tracy Knight is a 58 y.o. female  with a recent diagnosis of left breast cancer. The patient was noted to have a mass in the left breast on screening mammogram on 11/26/2018. Diagnostic work up  revealed a spiculated hypoechoic mass at the 2:00 position measuring 1.5 x 1.3 x 1.5 cm with associated vascularity, and 2 morphologically abnormal nodes in the left axilla were noted. She underwent a biopsy on 12/15/2018 of the mass and one of the nodes. Her lymph node was benign, and two biopsies of the left breast at 2:00 and another labled LIQ revealed a grade 2-3  invasive mammary carcinoma consistent with a lobular phenotype, and LCIS. The tumor was ER/PR positive, HER2 negative, and Ki 67 was 10%. She elected to proceed with mastectomy and reconstruction and was taken to the OR on 02/18/2019 where the left mastectomy specimen revealed a 1.6 cm invasive lobular carcinoma with negative resection margins.  One of the 5 nodes removed revealed micrometastatic disease with extranodal extension, and measured 5 mm, and there was a second node that had isolated tumor cells. She had mammaprint testing that was low risk and no chemotherapy is recommended. She is contacted by phone today to discuss the rationale to proceed with postmastectomy radiotherapy. Of note she saw Dr. Iran Planas yesterday and a small eschar of necrosis was removed and the incision line closed. She is to see her again next week for reevaluation.   PREVIOUS RADIATION THERAPY: No   PAST MEDICAL HISTORY:  Past Medical History:  Diagnosis Date  . Asthma   . Cancer ([place]) 11/2018   left breast cancer  . Diabetes mellitus without complication ([place])   . Dyslipidemia   . History of nuclear stress test 02/2009   exercise; normal pattern of perfusion; no significant ischemia demonstrated; low risk scan   . Hypertension        PAST SURGICAL HISTORY: Past Surgical History:  Procedure Laterality Date  . BREAST RECONSTRUCTION WITH PLACEMENT OF TISSUE EXPANDER AND ALLODERM Left 02/18/2019   Procedure: LEFT BREAST RECONSTRUCTION WITH PLACEMENT OF TISSUE EXPANDER AND ALLODERM;  Surgeon: Irene Limbo, MD;  Location: [place];  Service: Plastics;  Laterality: Left;  . CARDIAC CATHETERIZATION  07/2003   no evidence of CAD  . COLONOSCOPY N/A 02/17/2017   Procedure: COLONOSCOPY;  Surgeon: Daneil Dolin, MD;  [facility];  Service: Endoscopy;  Laterality: N/A;  10:00am  . MASTECTOMY W/ SENTINEL NODE BIOPSY Left 02/18/2019   Procedure: LEFT MASTECTOMY  WITH LEFT AXILLARY SENTINEL LYMPH NODE BIOPSY;  Surgeon: Alphonsa Overall, MD;  Location: [place];  Service: General;  Laterality: Left;  . POLYPECTOMY  02/17/2017   Procedure: POLYPECTOMY;  Surgeon: Daneil Dolin, MD;  [facility];  Service: Endoscopy;;  colon  . removal cyst on chin    . TRANSTHORACIC ECHOCARDIOGRAM  03/2007   EF=>55%;  borderline LA enlargement; MV mildly thickened/borderline MVP/mild MR     FAMILY HISTORY:  Family History  Problem Relation Age of Onset  . Heart disease Mother   . Heart disease Father   . Hypertension Sister   . Hypertension Daughter   . Diabetes Daughter      SOCIAL HISTORY:  reports that she has never smoked. She has never used smokeless tobacco. She reports that she does not drink alcohol or use drugs. The patient is married and lives in [place]. 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   ALLERGIES: Carvedilol   MEDICATIONS:  Current Outpatient Medications  Medication Sig Dispense Refill  . anastrozole (ARIMIDEX) 1 MG tablet Take 1 tablet (1 mg total) by mouth daily. 90 tablet 3  . cetirizine (ZYRTEC) 10 MG tablet Take 10 mg by mouth daily.    . cholecalciferol (VITAMIN D) 1000 UNITS tablet Take 1,000 Units by mouth 3 (three) times a week.     . Cyanocobalamin (VITAMIN B-12) 2500 MCG SUBL Place 2,500 mcg under the tongue daily.    Marland Kitchen diltiazem (TIAZAC) 360 MG 24 hr capsule TAKE 1 CAPSULE BY MOUTH DAILY 90 capsule 2  . Ferrous Sulfate Dried (SLOW RELEASE IRON) 45 MG TBCR Take 45 mg by mouth daily.    . fish oil-omega-3 fatty acids 1000 MG capsule Take 1,000 mg by mouth daily.     Marland Kitchen guaiFENesin (MUCINEX) 600 MG 12 hr tablet Take 600 mg by mouth daily as needed (for congestion/cough).    Marland Kitchen ibuprofen (ADVIL,MOTRIN) 200 MG tablet Take 200-400 mg by mouth every 8 (eight) hours as needed (for pain/headache.).    Marland Kitchen levalbuterol (XOPENEX) 1.25 MG/3ML nebulizer solution Inhale 1.25 mg into the lungs every 6 (six) hours as needed. For wheezing/shortness of breath  3  . losartan (COZAAR) 50 MG tablet TAKE 1 TABLET(50 MG) BY MOUTH DAILY 90 tablet 0  . metFORMIN (GLUCOPHAGE) 1000 MG tablet Take 1,000 mg by mouth daily before lunch.    . methocarbamol (ROBAXIN) 500 MG tablet Take 1 tablet (500 mg total) by mouth every 8  (eight) hours as needed for muscle spasms. 30 tablet 0  . montelukast (SINGULAIR) 10 MG tablet Take 10 mg by mouth daily before lunch.     . oxyCODONE (ROXICODONE) 5 MG immediate release tablet Take 1 tablet (5 mg total) by mouth every 4 (four) hours as needed. 40 tablet 0  . pravastatin (PRAVACHOL) 40 MG tablet TAKE 1 TABLET(40 MG) BY MOUTH DAILY 90 tablet 3  . PROAIR HFA 108 (90 Base) MCG/ACT inhaler INHALE 2 PUFFS INTO THE LUNGS Q 4 H PRN  2  . simvastatin (ZOCOR) 20 MG tablet     . sulfamethoxazole-trimethoprim (BACTRIM DS) 800-160 MG tablet Take 1 tablet by mouth 2 (two) times daily. 12 tablet 0  . triamterene-hydrochlorothiazide (MAXZIDE-25) 37.5-25 MG tablet Take 1 tablet by mouth daily.     No current facility-administered medications for this encounter.      REVIEW OF SYSTEMS: On review of systems, the patient reports that she  is doing well overall. She denies any chest pain, shortness of breath, cough, fevers, chills, night sweats, unintended weight changes. She denies any bowel or bladder disturbances, and denies abdominal pain, nausea or vomiting. She denies any new musculoskeletal or joint aches or pains. A complete review of systems is obtained and is otherwise negative.     PHYSICAL EXAM:  Wt Readings from Last 3 Encounters:  02/25/19 196 lb 8 oz (89.1 kg)  02/18/19 199 lb 4.7 oz (90.4 kg)  12/22/18 196 lb 9.6 oz (89.2 kg)   Unable to assess due to encounter type   ECOG = 0  0 - Asymptomatic (Fully active, able to carry on all predisease activities without restriction)  1 - Symptomatic but completely ambulatory (Restricted in physically strenuous activity but ambulatory and able to carry out work of a light or sedentary nature. For example, light housework, office work)  2 - Symptomatic, <50% in bed during the day (Ambulatory and capable of all self care but unable to carry out any work activities. Up and about more than 50% of waking hours)  3 - Symptomatic, >50% in  bed, but not bedbound (Capable of only limited self-care, confined to bed or chair 50% or more of waking hours)  4 - Bedbound (Completely disabled. Cannot carry on any self-care. Totally confined to bed or chair)  5 - Death   Eustace Pen MM, Creech RH, Tormey DC, et al. [phone]). "Toxicity and response criteria of the [facility]". [place] Oncol. 5 (6): 649-55    LABORATORY DATA:  Lab Results  Component Value Date   WBC 7.0 12/22/2018   HGB 12.8 12/22/2018   HCT 40.1 12/22/2018   MCV 83.9 12/22/2018   PLT 226 12/22/2018   Lab Results  Component Value Date   NA 140 02/15/2019   K 3.9 02/15/2019   CL 101 02/15/2019   CO2 29 02/15/2019   Lab Results  Component Value Date   ALT 18 12/22/2018   AST 19 12/22/2018   ALKPHOS 69 12/22/2018   BILITOT 0.8 12/22/2018      RADIOGRAPHY: Nm Sentinel Node Inj-no Rpt (breast)  Result Date: 02/18/2019 Sulfur colloid was injected by the nuclear medicine technologist for melanoma sentinel node.       IMPRESSION/PLAN: 1. Stage IA, [id number] grade 2, ER/PR positive invasive lobular carcinoma of the left breast. Dr. Lisbeth Renshaw discusses the pathology findings and reviews the nature of invasive left breast disease. The consensus from the breast conference included postmastectomy radiotherapy to the chest wall and regional nodes given the node positive findings,  followed by antiestrogen therapy. We discussed the risks, benefits, short, and long term effects of radiotherapy, and the patient is interested in proceeding. Dr. Lisbeth Renshaw discusses the delivery and logistics of radiotherapy and anticipates a course of 6 1/2 weeks of radiotherapy. She will come in for simulation in a few weeks and sign consent at that time to proceed. 2. Postoperative healing. She is seeing Dr. Iran Planas next week and we will follow up with her course next week. We will move her simulation out until 03/22/2019 to ensure she's fully healed.  Given current  concerns for patient exposure during the COVID-19 pandemic, this encounter was conducted via telephone.  The patient has given verbal consent for this type of encounter. The time spent during this encounter was 45 minutes and 50% of that time was spent in the coordination of her care. The attendants for this meeting included Dr. Lisbeth Renshaw, Shona Simpson, [facility]  and Pryor Curia  During the encounter, Dr. Lisbeth Renshaw and Shona Simpson [facility] were located at [facility] Radiation Oncology Department.  Tracy Knight  was located at home.  __________________  The above documentation reflects my direct findings during this shared patient visit. Please see the separate note by Dr. Lisbeth Renshaw on this date for the remainder of the patient's plan of care.    Carola Rhine, PAC

## 2019-03-11 ENCOUNTER — Encounter: Payer: Self-pay | Admitting: Hematology and Oncology

## 2019-03-11 ENCOUNTER — Ambulatory Visit: Payer: BC Managed Care – PPO | Admitting: Radiation Oncology

## 2019-03-12 NOTE — Therapy (Signed)
[place], [place], [address] Phone: [phone]   Fax:  [phone]  Physical Therapy Treatment NOTE: PATIENT WAS SEEN ON 03/10/2019. DOCUMENTATION COMPLETED ON 03/12/2019  Patient Details  Name: Tracy Knight MRN: [id number] Date of Birth: [birth year] Referring Provider (PT): Dr. Alphonsa Overall   Encounter Date: 03/10/2019  PT End of Session - 03/12/19 1513    Visit Number  2    Number of Visits  10    Date for PT Re-Evaluation  04/07/19    PT Start Time  1105    PT Stop Time  1145    PT Time Calculation (min)  40 min    Activity Tolerance  Patient tolerated treatment well    Behavior During Therapy  [facility] for tasks assessed/performed       Past Medical History:  Diagnosis Date  . Asthma   . Cancer ([place]) 11/2018   left breast cancer  . Diabetes mellitus without complication ([place])   . Dyslipidemia   . History of nuclear stress test 02/2009   exercise; normal pattern of perfusion; no significant ischemia demonstrated; low risk scan   . Hypertension     Past Surgical History:  Procedure Laterality Date  . BREAST RECONSTRUCTION WITH PLACEMENT OF TISSUE EXPANDER AND ALLODERM Left 02/18/2019   Procedure: LEFT BREAST RECONSTRUCTION WITH PLACEMENT OF TISSUE EXPANDER AND ALLODERM;  Surgeon: Irene Limbo, MD;  Location: [place];  Service: Plastics;  Laterality: Left;  . CARDIAC CATHETERIZATION  07/2003   no evidence of CAD  . COLONOSCOPY N/A 02/17/2017   Procedure: COLONOSCOPY;  Surgeon: Daneil Dolin, MD;  [facility];  Service: Endoscopy;  Laterality: N/A;  10:00am  . MASTECTOMY W/ SENTINEL NODE BIOPSY Left 02/18/2019   Procedure: LEFT MASTECTOMY WITH LEFT AXILLARY SENTINEL LYMPH NODE BIOPSY;  Surgeon: Alphonsa Overall, MD;  Location: [place];  Service: General;  Laterality: Left;  . POLYPECTOMY  02/17/2017   Procedure: POLYPECTOMY;  Surgeon: Daneil Dolin,  MD;  [facility];  Service: Endoscopy;;  colon  . removal cyst on chin    . TRANSTHORACIC ECHOCARDIOGRAM  03/2007   EF=>55%; borderline LA enlargement; MV mildly thickened/borderline MVP/mild MR    There were no vitals filed for this visit.    NOTE: Patient was seen on 03/10/2019 but note was not created until 03/12/2019. Objective findings were copied forward from that date.  [facility] PT Assessment - 03/12/19 0001 (actually seen on 03/10/2019)     Assessment   Medical Diagnosis  s/p left mastectomy SLNB with reconstruction    Referring Provider (PT)  Dr. Alphonsa Overall    Onset Date/Surgical Date  02/18/19    Hand Dominance  Right    Prior Therapy  Baselines      Precautions   Precautions  Other (comment)    Precaution Comments  recent surgery      Restrictions   Weight Bearing Restrictions  No      [place] residence    Living Arrangements  Spouse/significant other    Available Help at Discharge  Family      Prior Function   Level of Independence  Independent    Vocation  Full time employment    Vocation Requirements  She has not returned to work yet    Leisure  She has not returned to exercise yet      Cognition   Overall Cognitive  Status  Within Functional Limits for tasks assessed      Observation/Other Assessments   Observations  Incision healing well with no sign of infection. Implant in place.      Posture/Postural Control   Posture/Postural Control  Postural limitations    Postural Limitations  Rounded Shoulders;Forward head      AROM   Overall AROM Comments  IR/ER not tested due to abduction limitation    Left Shoulder Extension  42 Degrees    Left Shoulder Flexion  85 Degrees    Left Shoulder ABduction  82 Degrees        LYMPHEDEMA/ONCOLOGY QUESTIONNAIRE - 03/12/19 1526 (actually seen on 03/10/2019)     Type   Cancer Type  Left breast cancer      Surgeries   Mastectomy Date  02/18/19    Sentinel Lymph  Node Biopsy Date  02/18/19    Number Lymph Nodes Removed  5      Treatment   Active Chemotherapy Treatment  No    Past Chemotherapy Treatment  No    Active Radiation Treatment  No    Past Radiation Treatment  No    Current Hormone Treatment  No    Past Hormone Therapy  No      What other symptoms do you have   Are you Having Heaviness or Tightness  Yes    Are you having Pain  No    Are you having pitting edema  No    Is it Hard or Difficult finding clothes that fit  No    Do you have infections  No    Is there Decreased scar mobility  Yes    [place] Sign  No      Right Upper Extremity Lymphedema   10 cm Proximal to Olecranon Process  34.3 cm    Olecranon Process  27.6 cm    10 cm Proximal to Ulnar Styloid Process  24.8 cm    Just Proximal to Ulnar Styloid Process  17.4 cm    Across Hand at [employer]  20.7 cm    At [place] of 2nd Digit  6.8 cm      Left Upper Extremity Lymphedema   10 cm Proximal to Olecranon Process  35.7 cm    Olecranon Process  28.2 cm    10 cm Proximal to Ulnar Styloid Process  24.2 cm    Just Proximal to Ulnar Styloid Process  17.2 cm    Across Hand at [employer]  20.3 cm    At [place] of 2nd Digit  6.8 cm                        PT Education - 03/12/19 1517    Education Details  Reviewed all previously given HEP to ensure proper technique    Person(s) Educated  Patient    Methods  Explanation;Demonstration    Comprehension  Verbalized understanding;Returned demonstration          PT Long Term Goals - 03/12/19 1517      PT LONG TERM GOAL #1   Title  Patient will demonstrate she has regained shoulder ROM and function post operatively compared to baseline assessments.    Time  4    Period  Weeks    Status  On-going      PT LONG TERM GOAL #2   Title  Patient will increase left shoulder active flexion to >/= 140 degrees for increased  ease reaching overhead.    Baseline  85 degrees post op    Time  4    Period  Weeks     Status  New    Target Date  04/07/19      PT LONG TERM GOAL #3   Title  Patient will increase left shoulder active abduction to >/= 150 degrees for increased ease reaching overhead.    Baseline  82 degrees post op    Time  4    Period  Weeks    Status  New    Target Date  04/07/19      PT LONG TERM GOAL #4   Title  Patient will decrease DASH score to </= 5 for improved overall shoulder and upper extremity function.    Baseline  50 post op but 4.55 pre-surgery    Time  4    Period  Weeks    Status  New    Target Date  04/07/19      PT LONG TERM GOAL #5   Title  Patient will report she is able to return to her biking and other exercises for helping to reduce recurrence risk and general overall better health.    Time  4    Period  Weeks    Status  New    Target Date  04/07/19      Additional Long Term Goals   Additional Long Term Goals  Yes      PT LONG TERM GOAL #6   Title  Patient will verbalize good understanding of lymphedema risk reduction practices.    Time  4    Period  Weeks    Status  New            Plan - 03/12/19 1514    Clinical Impression Statement  Patient is healing well s/p left mastectomy with sentinel node biopsy and reconstruction. She appears to have a pre pectoral expander in place. Her incisions appear to be healing well with no sign of infection. Her shoulder ROM is poor and will benefit from patient education and physical therapy. She will undergo radiation to likely include her axillary region since she had 1 positive axillary lymph node so she will benefit from education on lymphedema risk reduction.    Rehab Potential  Excellent    PT Frequency  2x / week    PT Duration  4 weeks    PT Treatment/Interventions  ADLs/Self Care Home Management;Therapeutic exercise;Patient/family education;Manual techniques;Manual lymph drainage;Therapeutic activities;Scar mobilization;Passive range of motion    PT Next Visit Plan  Begin gentle PROM left shoulder;  pulleys to tolreance, ball up wall, postural exercises. Help get her signed up for the ABC class    PT Home Exercise Plan  Post op shoulder ROM HEP    Consulted and Agree with Plan of Care  Patient       Patient will benefit from skilled therapeutic intervention in order to improve the following deficits and impairments:  Decreased knowledge of precautions, Impaired UE functional use, Pain, Postural dysfunction, Decreased range of motion, Increased fascial restricitons  Visit Diagnosis: Malignant neoplasm of overlapping sites of left breast in female, estrogen receptor positive ([place]) - Plan: PT plan of care cert/re-cert  Abnormal posture - Plan: PT plan of care cert/re-cert  Aftercare following surgery for neoplasm - Plan: PT plan of care cert/re-cert  Stiffness of left shoulder, not elsewhere classified - Plan: PT plan of care cert/re-cert     Problem List Patient  Active Problem List   Diagnosis Date Noted  . Breast cancer, left breast ([place]) 02/18/2019  . Malignant neoplasm of overlapping sites of left breast in female, estrogen receptor positive ([place]) 12/17/2018  . Moderate obesity 03/13/2016  . Palpitations 03/06/2013  . Hyperlipidemia, mixed 03/06/2013  . Hypertension 02/14/2013  . Non-insulin treated type 2 diabetes mellitus ([place]) 02/14/2013   Annia Friendly, PT 03/12/19 3:28 PM   [place], [place], [address] Phone: [phone]   Fax:  [phone]  Name: ANNAMARIE DECHRISTOPHER MRN: [id number] Date of Birth: Dec 12, 1960

## 2019-03-14 ENCOUNTER — Ambulatory Visit: Payer: BC Managed Care – PPO

## 2019-03-14 ENCOUNTER — Other Ambulatory Visit: Payer: Self-pay

## 2019-03-14 DIAGNOSIS — Z170 Estrogen receptor positive status [ER+]: Secondary | ICD-10-CM | POA: Diagnosis not present

## 2019-03-14 DIAGNOSIS — Z483 Aftercare following surgery for neoplasm: Secondary | ICD-10-CM | POA: Diagnosis not present

## 2019-03-14 DIAGNOSIS — M25612 Stiffness of left shoulder, not elsewhere classified: Secondary | ICD-10-CM

## 2019-03-14 DIAGNOSIS — R293 Abnormal posture: Secondary | ICD-10-CM

## 2019-03-14 DIAGNOSIS — C50812 Malignant neoplasm of overlapping sites of left female breast: Secondary | ICD-10-CM

## 2019-03-14 NOTE — Therapy (Signed)
[place], [place], [address] Phone: [phone]   Fax:  [phone]  Physical Therapy Treatment  Patient Details  Name: Tracy Knight MRN: [id number] Date of Birth: [birth year] Referring Provider (PT): Dr. Alphonsa Overall   Encounter Date: 03/14/2019  PT End of Session - 03/14/19 1205    Visit Number  3    Number of Visits  10    Date for PT Re-Evaluation  04/07/19    PT Start Time  1105    PT Stop Time  1145    PT Time Calculation (min)  40 min    Activity Tolerance  Patient tolerated treatment well    Behavior During Therapy  [facility] for tasks assessed/performed       Past Medical History:  Diagnosis Date  . Asthma   . Cancer ([place]) 11/2018   left breast cancer  . Diabetes mellitus without complication ([place])   . Dyslipidemia   . History of nuclear stress test 02/2009   exercise; normal pattern of perfusion; no significant ischemia demonstrated; low risk scan   . Hypertension     Past Surgical History:  Procedure Laterality Date  . BREAST RECONSTRUCTION WITH PLACEMENT OF TISSUE EXPANDER AND ALLODERM Left 02/18/2019   Procedure: LEFT BREAST RECONSTRUCTION WITH PLACEMENT OF TISSUE EXPANDER AND ALLODERM;  Surgeon: Irene Limbo, MD;  Location: [place];  Service: Plastics;  Laterality: Left;  . CARDIAC CATHETERIZATION  07/2003   no evidence of CAD  . COLONOSCOPY N/A 02/17/2017   Procedure: COLONOSCOPY;  Surgeon: Daneil Dolin, MD;  [facility];  Service: Endoscopy;  Laterality: N/A;  10:00am  . MASTECTOMY W/ SENTINEL NODE BIOPSY Left 02/18/2019   Procedure: LEFT MASTECTOMY WITH LEFT AXILLARY SENTINEL LYMPH NODE BIOPSY;  Surgeon: Alphonsa Overall, MD;  Location: [place];  Service: General;  Laterality: Left;  . POLYPECTOMY  02/17/2017   Procedure: POLYPECTOMY;  Surgeon: Daneil Dolin, MD;  [facility];  Service: Endoscopy;;  colon  . removal cyst  on chin    . TRANSTHORACIC ECHOCARDIOGRAM  03/2007   EF=>55%; borderline LA enlargement; MV mildly thickened/borderline MVP/mild MR    There were no vitals filed for this visit.  Subjective Assessment - 03/14/19 1107    Subjective  pt reports that she has been doing her exercises at home. She is still experiencing 5/10 pain when holding her arm up for extended periods of time. She has been walkingfor 30 min every day except when it is really hot and then she does it on her exercise bike.    Currently in Pain?  Yes    Pain Score  5     Pain Location  Shoulder    Pain Orientation  Left    Pain Frequency  Occasional    Aggravating Factors   elevating her arm    Pain Relieving Factors  rest    Multiple Pain Sites  No                       [facility] Adult PT Treatment/Exercise - 03/14/19 0001      Exercises   Exercises  Shoulder      Shoulder Exercises: Supine   Other Supine Exercises  chest press w/should flexion w/dowel 10x 5 s hold    Other Supine Exercises  ER w/dowel and PT assist at L elbow to prevent abd compensation 10x5 s hold  Shoulder Exercises: Pulleys   Flexion  2 minutes    ABduction  2 minutes      Shoulder Exercises: Therapy Ball   Flexion  10 reps   5 s hold LUE only    ABduction  10 reps   5 s hold LUE only      Manual Therapy   Manual Therapy  Passive ROM    Manual therapy comments  PROM performed 5x in all directions w/5 s hold with very minimal stretching in order to protect skin integrity. Scapular mobs performed in supine in order to improve flexibility at the scapulo-thoracic joint before pt begins more active ROm of the L shoulder to decrease ROM restrictions             PT Education - 03/14/19 1205    Education Details  Pt educated on HEP and importance of avoiding aggressive stretching at this time in order to protect incision site.    Person(s) Educated  Patient    Methods  Explanation;Demonstration    Comprehension   Verbalized understanding;Returned demonstration          PT Long Term Goals - 03/12/19 1517      PT LONG TERM GOAL #1   Title  Patient will demonstrate she has regained shoulder ROM and function post operatively compared to baseline assessments.    Time  4    Period  Weeks    Status  On-going      PT LONG TERM GOAL #2   Title  Patient will increase left shoulder active flexion to >/= 140 degrees for increased ease reaching overhead.    Baseline  85 degrees post op    Time  4    Period  Weeks    Status  New    Target Date  04/07/19      PT LONG TERM GOAL #3   Title  Patient will increase left shoulder active abduction to >/= 150 degrees for increased ease reaching overhead.    Baseline  82 degrees post op    Time  4    Period  Weeks    Status  New    Target Date  04/07/19      PT LONG TERM GOAL #4   Title  Patient will decrease DASH score to </= 5 for improved overall shoulder and upper extremity function.    Baseline  50 post op but 4.55 pre-surgery    Time  4    Period  Weeks    Status  New    Target Date  04/07/19      PT LONG TERM GOAL #5   Title  Patient will report she is able to return to her biking and other exercises for helping to reduce recurrence risk and general overall better health.    Time  4    Period  Weeks    Status  New    Target Date  04/07/19      Additional Long Term Goals   Additional Long Term Goals  Yes      PT LONG TERM GOAL #6   Title  Patient will verbalize good understanding of lymphedema risk reduction practices.    Time  4    Period  Weeks    Status  New            Plan - 03/14/19 1151    Clinical Impression Statement  Pt continues to progress following s/p L mastectomy with sentinel node biopsy and  reconstruction. She continues to report that she is performing her exercises at home and has improved ROM from her last session. Due to incision state of healing over the reconstruction area pt advised to avoid aggressive  stretching at this time to decrease risk for incision dehiscence. Pt tolerated easy PROM with light stretching and scapulo-thoracic mobs to improve ROM prior to radiation therapy. Pt will benefit from continued skilled physical therapy services to addres ROM, pain and strength in the LUE. Continue with current POC.    Stability/Clinical Decision Making  Stable/Uncomplicated    PT Frequency  2x / week    PT Duration  4 weeks    PT Treatment/Interventions  ADLs/Self Care Home Management;Therapeutic exercise;Patient/family education;Manual techniques;Manual lymph drainage;Therapeutic activities;Scar mobilization;Passive range of motion    PT Next Visit Plan  Progress PROM exercises and provide exercise sheet. Begin postural exercises and sign up for ABC class.    PT Home Exercise Plan  Continue with current POC.       Patient will benefit from skilled therapeutic intervention in order to improve the following deficits and impairments:  Decreased knowledge of precautions, Impaired UE functional use, Pain, Postural dysfunction, Decreased range of motion, Increased fascial restricitons  Visit Diagnosis: Malignant neoplasm of overlapping sites of left breast in female, estrogen receptor positive ([place])  Abnormal posture  Aftercare following surgery for neoplasm  Stiffness of left shoulder, not elsewhere classified     Problem List Patient Active Problem List   Diagnosis Date Noted  . Breast cancer, left breast ([place]) 02/18/2019  . Malignant neoplasm of overlapping sites of left breast in female, estrogen receptor positive ([place]) 12/17/2018  . Moderate obesity 03/13/2016  . Palpitations 03/06/2013  . Hyperlipidemia, mixed 03/06/2013  . Hypertension 02/14/2013  . Non-insulin treated type 2 diabetes mellitus ([place]) 02/14/2013    Tomma Rakers, PT 03/14/19 12:19 PM  [place], [place], [address] Phone:  [phone]   Fax:  [phone]  Name: ISHANVI THIELKE MRN: [id number] Date of Birth: [birth year]

## 2019-03-14 NOTE — Patient Instructions (Signed)
Pt was instructed to continue with her current HEP at this time. Discussed the importance of movement in order to prevent loss of ROM within reasonable limitations including avoiding pain and any movements over the shoulder with wgt at this time to preserve skin integrity at the incision site. Discussed care of incision site that pt was given at MD office following removal of stitches.

## 2019-03-16 ENCOUNTER — Other Ambulatory Visit: Payer: Self-pay

## 2019-03-16 ENCOUNTER — Ambulatory Visit: Payer: BC Managed Care – PPO

## 2019-03-16 ENCOUNTER — Telehealth: Payer: Self-pay

## 2019-03-16 DIAGNOSIS — Z170 Estrogen receptor positive status [ER+]: Secondary | ICD-10-CM | POA: Diagnosis not present

## 2019-03-16 DIAGNOSIS — Z483 Aftercare following surgery for neoplasm: Secondary | ICD-10-CM | POA: Diagnosis not present

## 2019-03-16 DIAGNOSIS — M25612 Stiffness of left shoulder, not elsewhere classified: Secondary | ICD-10-CM | POA: Diagnosis not present

## 2019-03-16 DIAGNOSIS — C50812 Malignant neoplasm of overlapping sites of left female breast: Secondary | ICD-10-CM

## 2019-03-16 DIAGNOSIS — R293 Abnormal posture: Secondary | ICD-10-CM

## 2019-03-16 NOTE — Therapy (Signed)
[place], [place], [address] Phone: [phone]   Fax:  [phone]  Physical Therapy Treatment  Patient Details  Name: Tracy Knight MRN: [id number] Date of Birth: April 08, 1961 Referring Provider (PT): Dr. Alphonsa Overall   Encounter Date: 03/16/2019  PT End of Session - 03/16/19 1100    Visit Number  4    Number of Visits  10    Date for PT Re-Evaluation  04/07/19    PT Start Time  1010    PT Stop Time  1048    PT Time Calculation (min)  38 min    Activity Tolerance  Patient tolerated treatment well    Behavior During Therapy  [facility] for tasks assessed/performed       Past Medical History:  Diagnosis Date  . Asthma   . Cancer ([place]) 11/2018   left breast cancer  . Diabetes mellitus without complication ([place])   . Dyslipidemia   . History of nuclear stress test 02/2009   exercise; normal pattern of perfusion; no significant ischemia demonstrated; low risk scan   . Hypertension     Past Surgical History:  Procedure Laterality Date  . BREAST RECONSTRUCTION WITH PLACEMENT OF TISSUE EXPANDER AND ALLODERM Left 02/18/2019   Procedure: LEFT BREAST RECONSTRUCTION WITH PLACEMENT OF TISSUE EXPANDER AND ALLODERM;  Surgeon: Irene Limbo, MD;  Location: [place];  Service: Plastics;  Laterality: Left;  . CARDIAC CATHETERIZATION  07/2003   no evidence of CAD  . COLONOSCOPY N/A 02/17/2017   Procedure: COLONOSCOPY;  Surgeon: Daneil Dolin, MD;  [facility];  Service: Endoscopy;  Laterality: N/A;  10:00am  . MASTECTOMY W/ SENTINEL NODE BIOPSY Left 02/18/2019   Procedure: LEFT MASTECTOMY WITH LEFT AXILLARY SENTINEL LYMPH NODE BIOPSY;  Surgeon: Alphonsa Overall, MD;  Location: [place];  Service: General;  Laterality: Left;  . POLYPECTOMY  02/17/2017   Procedure: POLYPECTOMY;  Surgeon: Daneil Dolin, MD;  [facility];  Service: Endoscopy;;  colon  . removal cyst  on chin    . TRANSTHORACIC ECHOCARDIOGRAM  03/2007   EF=>55%; borderline LA enlargement; MV mildly thickened/borderline MVP/mild MR    There were no vitals filed for this visit.  Subjective Assessment - 03/16/19 1011    Subjective  Pt states that her pain is a 3/10. She reports that she did 45 min on her exercise bike yesterday.    Pertinent History  Patient was diagnosed on 11/26/2018 with left grade II-III invasive lobular carcinoma breast cancer. It is ER/PR positive, HER2 negative, and a Ki67 of 10%. Patient underwent a left mastectomy and sentinel node biopsy on 02/18/2019. One of 5 lymph nodes was positive but her Mammaprint came back low risk so no chemo is needed. She will undergo axillary and chest radiation.    Pain Score  3     Pain Location  Shoulder    Pain Orientation  Left    Pain Descriptors / Indicators  Tightness    Pain Onset  More than a month ago                       [facility] Adult PT Treatment/Exercise - 03/16/19 0001      Shoulder Exercises: Standing   External Rotation  Left    Theraband Level (Shoulder External Rotation)  Level 1 (Yellow)    External Rotation Limitations  VC required to prevent rotation of the trunk  Internal Rotation  Left    Theraband Level (Shoulder Internal Rotation)  Level 1 (Yellow)    Internal Rotation Limitations  VC required to prevent rotation of the trunk    Flexion  Left    Theraband Level (Shoulder Flexion)  Level 1 (Yellow)    Flexion Limitations  VC to decrease L shoulder hiking    Extension  Left    Theraband Level (Shoulder Extension)  Level 1 (Yellow)    Extension Limitations  VC to prevent shoulder hiking      Shoulder Exercises: Therapy Ball   Flexion  Other (comment)   Ball on plinth table 2 min flexion for warming tissue   ABduction  Other (comment)   Ball on plinth table 2 min abd for tissue warm up   Scaption  20 reps    Scaption Limitations  Pt requires instruction to maintain B equal movement to  promote improved ROM in the L shoulder.       Manual Therapy   Manual Therapy  Passive ROM    Manual therapy comments  PROM performed 5x in all directions w/5 s hold. Pt continues to be tight at the scapulo-thoracic joint, scapular mobs continued to decrease ROM restrictions. Passive D2 flexion pattern modified w/elbow flexion to mimic functional movements w/minimal end range stretch.              PT Education - 03/16/19 1058    Education Details  Pt educated on [place] exercises for home. Discussed beginning with 5 repetitions and if she is not sore then increasing to 10 repetitions slowly 2 repettions/day. Provided with Yellow theraband.    Person(s) Educated  Patient    Methods  Explanation;Demonstration;Tactile cues;Verbal cues   verbal cueing to prevent trunk rotation and prevent compensation w/abduction during ER.   Comprehension  Verbalized understanding;Returned demonstration          PT Long Term Goals - 03/12/19 1517      PT LONG TERM GOAL #1   Title  Patient will demonstrate she has regained shoulder ROM and function post operatively compared to baseline assessments.    Time  4    Period  Weeks    Status  On-going      PT LONG TERM GOAL #2   Title  Patient will increase left shoulder active flexion to >/= 140 degrees for increased ease reaching overhead.    Baseline  85 degrees post op    Time  4    Period  Weeks    Status  New    Target Date  04/07/19      PT LONG TERM GOAL #3   Title  Patient will increase left shoulder active abduction to >/= 150 degrees for increased ease reaching overhead.    Baseline  82 degrees post op    Time  4    Period  Weeks    Status  New    Target Date  04/07/19      PT LONG TERM GOAL #4   Title  Patient will decrease DASH score to </= 5 for improved overall shoulder and upper extremity function.    Baseline  50 post op but 4.55 pre-surgery    Time  4    Period  Weeks    Status  New    Target Date  04/07/19      PT  LONG TERM GOAL #5   Title  Patient will report she is able to return to her biking and  other exercises for helping to reduce recurrence risk and general overall better health.    Time  4    Period  Weeks    Status  New    Target Date  04/07/19      Additional Long Term Goals   Additional Long Term Goals  Yes      PT LONG TERM GOAL #6   Title  Patient will verbalize good understanding of lymphedema risk reduction practices.    Time  4    Period  Weeks    Status  New            Plan - 03/16/19 1100    Clinical Impression Statement  Pt continues to progress with her mobility and pain in her L shoulder. She is reporting decreased pain from her last session and able to get greater functional ROM w/o an increase in pain from baseline. Pt was able to tolerate functional movement patterns w/slight end range stretch w/o an increase in pain. Pt tolerated increased AROM and light strengthening exercises well w/o an increase in pain and was able to improve with compensations following moderate voice and tactile cues.    Stability/Clinical Decision Making  Stable/Uncomplicated    Rehab Potential  Excellent    PT Frequency  2x / week    PT Duration  4 weeks    PT Treatment/Interventions  ADLs/Self Care Home Management;Therapeutic exercise;Patient/family education;Manual techniques;Manual lymph drainage;Therapeutic activities;Scar mobilization;Passive range of motion    PT Next Visit Plan  Assess strengthening exercises and ad postural exercises. Help sign up for ABC class.    PT Home Exercise Plan  Continue with current POC.    Consulted and Agree with Plan of Care  Patient       Patient will benefit from skilled therapeutic intervention in order to improve the following deficits and impairments:  Decreased knowledge of precautions, Impaired UE functional use, Pain, Postural dysfunction, Decreased range of motion, Increased fascial restricitons  Visit Diagnosis: Malignant neoplasm of  overlapping sites of left breast in female, estrogen receptor positive ([place])  Abnormal posture  Aftercare following surgery for neoplasm  Stiffness of left shoulder, not elsewhere classified     Problem List Patient Active Problem List   Diagnosis Date Noted  . Breast cancer, left breast ([place]) 02/18/2019  . Malignant neoplasm of overlapping sites of left breast in female, estrogen receptor positive ([place]) 12/17/2018  . Moderate obesity 03/13/2016  . Palpitations 03/06/2013  . Hyperlipidemia, mixed 03/06/2013  . Hypertension 02/14/2013  . Non-insulin treated type 2 diabetes mellitus ([place]) 02/14/2013    Ander Purpura, PT 03/16/2019, 11:12 AM  [place], [place], [address] Phone: [phone]   Fax:  [phone]  Name: JOSCLYN ROSALES MRN: [id number] Date of Birth: 1960-10-23

## 2019-03-16 NOTE — Patient Instructions (Signed)
Strengthening: Resisted Flexion    Cancer Rehab [phone]    Hold tubing with left arm at side. Pull forward and up. Move shoulder through pain-free range of motion. Repeat _5-10___ times per set. Do _1-2___ sessions per day.  Strengthening: Resisted Internal Rotation    Hold tubing in left hand, elbow at side and forearm out. Rotate forearm in across body. Repeat _5-10___ times per set. Do _1-2___ sessions per day.  Strengthening: Resisted Extension    Hold tubing in left hand, arm forward. Pull arm back, elbow straight. Repeat __5-10__ times per set. Do __1-2__ sessions per day.   Strengthening: Resisted External Rotation    Hold tubing in left hand, elbow at side and forearm across body. Rotate forearm out. Repeat _5-10___ times per set. Do __1-2__ sessions per day.    

## 2019-03-17 ENCOUNTER — Encounter (HOSPITAL_COMMUNITY): Payer: Self-pay | Admitting: Hematology and Oncology

## 2019-03-22 ENCOUNTER — Ambulatory Visit: Payer: BC Managed Care – PPO | Admitting: Radiation Oncology

## 2019-03-23 DIAGNOSIS — S21002D Unspecified open wound of left breast, subsequent encounter: Secondary | ICD-10-CM | POA: Diagnosis not present

## 2019-03-23 DIAGNOSIS — Z9012 Acquired absence of left breast and nipple: Secondary | ICD-10-CM | POA: Diagnosis not present

## 2019-03-23 DIAGNOSIS — Z853 Personal history of malignant neoplasm of breast: Secondary | ICD-10-CM | POA: Diagnosis not present

## 2019-03-25 ENCOUNTER — Encounter: Payer: Self-pay | Admitting: *Deleted

## 2019-04-01 DIAGNOSIS — Z23 Encounter for immunization: Secondary | ICD-10-CM | POA: Diagnosis not present

## 2019-04-05 ENCOUNTER — Ambulatory Visit: Payer: BC Managed Care – PPO | Admitting: Radiation Oncology

## 2019-04-06 ENCOUNTER — Telehealth: Payer: Self-pay | Admitting: Hematology and Oncology

## 2019-04-06 ENCOUNTER — Ambulatory Visit: Payer: BC Managed Care – PPO | Attending: Surgery

## 2019-04-06 ENCOUNTER — Other Ambulatory Visit: Payer: Self-pay

## 2019-04-06 DIAGNOSIS — Z483 Aftercare following surgery for neoplasm: Secondary | ICD-10-CM

## 2019-04-06 DIAGNOSIS — C50812 Malignant neoplasm of overlapping sites of left female breast: Secondary | ICD-10-CM | POA: Insufficient documentation

## 2019-04-06 DIAGNOSIS — Z170 Estrogen receptor positive status [ER+]: Secondary | ICD-10-CM | POA: Insufficient documentation

## 2019-04-06 DIAGNOSIS — M25612 Stiffness of left shoulder, not elsewhere classified: Secondary | ICD-10-CM | POA: Diagnosis not present

## 2019-04-06 DIAGNOSIS — R293 Abnormal posture: Secondary | ICD-10-CM | POA: Insufficient documentation

## 2019-04-06 NOTE — Telephone Encounter (Signed)
Scheduled appt per 10/7 sch message - reminder letter mailed with appt date and time

## 2019-04-06 NOTE — Therapy (Signed)
[place], [place], [address] Phone: [phone]   Fax:  [phone]  Physical Therapy Treatment  Patient Details  Name: Tracy Knight MRN: [id number] Date of Birth: [birth year] Referring Provider (PT): Dr. Alphonsa Overall   Encounter Date: 04/06/2019  PT End of Session - 04/06/19 1017    Visit Number  5    Number of Visits  10    Date for PT Re-Evaluation  04/07/19    PT Start Time  1010    PT Stop Time  1100    PT Time Calculation (min)  50 min    Activity Tolerance  Patient tolerated treatment well    Behavior During Therapy  [facility] for tasks assessed/performed       Past Medical History:  Diagnosis Date  . Asthma   . Cancer ([place]) 11/2018   left breast cancer  . Diabetes mellitus without complication ([place])   . Dyslipidemia   . History of nuclear stress test 02/2009   exercise; normal pattern of perfusion; no significant ischemia demonstrated; low risk scan   . Hypertension     Past Surgical History:  Procedure Laterality Date  . BREAST RECONSTRUCTION WITH PLACEMENT OF TISSUE EXPANDER AND ALLODERM Left 02/18/2019   Procedure: LEFT BREAST RECONSTRUCTION WITH PLACEMENT OF TISSUE EXPANDER AND ALLODERM;  Surgeon: Irene Limbo, MD;  Location: [facility];  Service: Plastics;  Laterality: Left;  . CARDIAC CATHETERIZATION  07/2003   no evidence of CAD  . COLONOSCOPY N/A 02/17/2017   Procedure: COLONOSCOPY;  Surgeon: Daneil Dolin, MD;  [facility];  Service: Endoscopy;  Laterality: N/A;  10:00am  . MASTECTOMY W/ SENTINEL NODE BIOPSY Left 02/18/2019   Procedure: LEFT MASTECTOMY WITH LEFT AXILLARY SENTINEL LYMPH NODE BIOPSY;  Surgeon: Alphonsa Overall, MD;  Location: [facility];  Service: General;  Laterality: Left;  . POLYPECTOMY  02/17/2017   Procedure: POLYPECTOMY;  Surgeon: Daneil Dolin, MD;  [facility];  Service: Endoscopy;;  colon  . removal cyst  on chin    . TRANSTHORACIC ECHOCARDIOGRAM  03/2007   EF=>55%; borderline LA enlargement; MV mildly thickened/borderline MVP/mild MR    There were no vitals filed for this visit.  Subjective Assessment - 04/06/19 1015    Subjective  Pt states that she had to get re-stitched by Dr. Iran Planas on Monday of this week (04/04/19). She states that the week before she had re-stitched the bottom. Pt reports that she had stopped all of her exercises at home once she started to have trouble with her incisions dehiscing.    Pertinent History  Patient was diagnosed on 11/26/2018 with left grade II-III invasive lobular carcinoma breast cancer. It is ER/PR positive, HER2 negative, and a Ki67 of 10%. Patient underwent a left mastectomy and sentinel node biopsy on 02/18/2019. One of 5 lymph nodes was positive but her Mammaprint came back low risk so no chemo is needed. She will undergo axillary and chest radiation.    Patient Stated Goals  Get my arm moving better.    Currently in Pain?  Yes    Pain Score  7     Pain Location  Shoulder    Pain Orientation  Left    Pain Descriptors / Indicators  Tightness;Sore    Pain Type  Chronic pain    Pain Onset  More than a month ago    Pain Frequency  Intermittent    Aggravating Factors   elevating  her arm    Pain Relieving Factors  rest    Multiple Pain Sites  No         [facility] PT Assessment - 04/06/19 0001      AROM   Left Shoulder Flexion  98 Degrees    Left Shoulder ABduction  77 Degrees           Quick Dash - 04/06/19 0001    Open a tight or new jar  Moderate difficulty    Do heavy household chores (wash walls, wash floors)  Unable    Carry a shopping bag or briefcase  Mild difficulty    Wash your back  Mild difficulty    Use a knife to cut food  Moderate difficulty    Recreational activities in which you take some force or impact through your arm, shoulder, or hand (golf, hammering, tennis)  Unable    During the past week, to what extent has your  arm, shoulder or hand problem interfered with your normal social activities with family, friends, neighbors, or groups?  Not at all    During the past week, to what extent has your arm, shoulder or hand problem limited your work or other regular daily activities  Extremely    Arm, shoulder, or hand pain.  Extreme    Tingling (pins and needles) in your arm, shoulder, or hand  Extreme    Difficulty Sleeping  Moderate difficulty    DASH Score  63.64 %             [facility] Adult PT Treatment/Exercise - 04/06/19 0001      Shoulder Exercises: Supine   Other Supine Exercises  chest press w/dowel, Chest press w/shoulder flexion with dowel 10x each, VC for avoiding pain or anything more than very light stretching. Shoulder protraction with dowel with tactile/VC for correct movement.     Other Supine Exercises  B ER w/dowel in supine with towels at B elbows for tactile input in order to decrease compensation with abduction 10x VC to prevent elbow extension compensation, Abd with dowel in supine w/emphasis on very very light stretching only to decrease risk for further dehiscence of the incisions and VC for correct form.       Manual Therapy   Manual Therapy  Passive ROM    Manual therapy comments  PROM performed in ER, flexion, abd and IR with very easy end range stretching, distraction and oscillations for improved movement.              PT Education - 04/06/19 1037    Education Details  Access Code: [email or web address], Pt educated to continue to hold her prevoius exercises and only perform the exercise sheet provided today. Emphasized the importance of performing very easy stretching in order to prevent dehiscenceof the incisions on her BUE. Discussed POC.    Person(s) Educated  Patient    Methods  Explanation;Demonstration;Verbal cues;Tactile cues;Handout    Comprehension  Verbalized understanding;Returned demonstration       PT Short Term Goals - 04/06/19 1027      PT SHORT TERM GOAL #1    Title  Pt will demonstrate 120 degrees flexion/abd within 3 weeks in order to demonstrate improvement of LUE functional ROM.    Baseline  98 degrees flexion, 77 degrees abd LUE    Time  3    Period  Weeks    Status  New    Target Date  04/27/19        PT Long  Term Goals - 04/06/19 1018      PT LONG TERM GOAL #1   Title  Patient will demonstrate she has regained shoulder ROM and function post operatively compared to baseline assessments.    Baseline  See measurements    Time  4    Period  Weeks    Status  On-going    Target Date  05/11/19      PT LONG TERM GOAL #2   Title  Patient will increase left shoulder active flexion to >/= 140 degrees for increased ease reaching overhead.    Baseline  98 degrees flexion 04/06/19    Time  5    Period  Weeks    Status  On-going    Target Date  05/11/19      PT LONG TERM GOAL #3   Title  Patient will increase left shoulder active abduction to >/= 150 degrees for increased ease reaching overhead.    Baseline  77 degrees 04/06/19    Time  5    Period  Weeks    Status  On-going    Target Date  05/11/19      PT LONG TERM GOAL #4   Title  Patient will decrease DASH score to </= 5 for improved overall shoulder and upper extremity function.    Baseline  63% arm disability 04/06/19    Time  5    Period  Weeks    Status  New    Target Date  05/11/19      PT LONG TERM GOAL #5   Title  Patient will report she is able to return to her biking and other exercises for helping to reduce recurrence risk and general overall better health.    Baseline  Pt reports that she rides her exercise bike and walks outside about 30 min 3-4x/week    Time  5    Period  Weeks    Status  Partially Met    Target Date  05/11/19      PT LONG TERM GOAL #6   Title  Patient will verbalize good understanding of lymphedema risk reduction practices.    Baseline  Pt has difficulty naming 1 of the precautions for risk reduction practices without cueing.    Time  5     Period  Weeks    Status  On-going    Target Date  05/11/19            Plan - 04/06/19 1018    Clinical Impression Statement  Pt presents to physical therapy after 3 weeks on hold due to dehiscence of her incisions. She reports that she has gotten new stitches 2 times once on the bottom and once on the top 2 days ago due to continued difficulty with dehiscence. MD would like for her to begin PT again today with easy ROM. Pt ROM into abduction has gotten worse since her initial evaluation and improved slightly into flexion. Pt HEP was updated this session with just ROM activities and discussed in depth and at lenght the importance of trying to avoid stress on the incision with ROM activities. No retraction exercises were initiated today in order to avoid stress on the anterior chest wall. PROM was performed in all directions with empty end feel due to pain on the superior portion of the shoulder (no stress on incision). Light STM was performed on the L deltoid with significant tightness; decreased slightly following light STM. MLD was performed following STM in order  to decrease risk for lymphedema. Pt will benefit from continued skilled physical therapy services 2x/week for 5 weeks in order to address the above limitations to return to treatments and to work.    Stability/Clinical Decision Making  Stable/Uncomplicated    Rehab Potential  Excellent    PT Frequency  2x / week    PT Duration  Other (comment)   5 weeks.   PT Treatment/Interventions  ADLs/Self Care Home Management;Therapeutic exercise;Patient/family education;Manual techniques;Manual lymph drainage;Therapeutic activities;Scar mobilization;Passive range of motion    PT Next Visit Plan  Assess STM/MLD and AAROM exercise sheet.    PT Home Exercise Plan  Access Code: [email or web address]    Consulted and Agree with Plan of Care  Patient       Patient will benefit from skilled therapeutic intervention in order to improve the following deficits and  impairments:  Decreased knowledge of precautions, Impaired UE functional use, Pain, Postural dysfunction, Decreased range of motion, Increased fascial restricitons  Visit Diagnosis: Malignant neoplasm of overlapping sites of left breast in female, estrogen receptor positive ([place])  Abnormal posture  Aftercare following surgery for neoplasm  Stiffness of left shoulder, not elsewhere classified     Problem List Patient Active Problem List   Diagnosis Date Noted  . Breast cancer, left breast ([place]) 02/18/2019  . Malignant neoplasm of overlapping sites of left breast in female, estrogen receptor positive ([place]) 12/17/2018  . Moderate obesity 03/13/2016  . Palpitations 03/06/2013  . Hyperlipidemia, mixed 03/06/2013  . Hypertension 02/14/2013  . Non-insulin treated type 2 diabetes mellitus ([place]) 02/14/2013    Ander Purpura, PT 04/06/2019, 11:03 AM  [place], [place], [address] Phone: [phone]   Fax:  [phone]  Name: CREEDENCE HEISS MRN: [id number] Date of Birth: 1961/03/05

## 2019-04-06 NOTE — Patient Instructions (Signed)
Access Code: [email or web address]  URL: [email or web address]  Date: 04/06/2019  Prepared by: Tomma Rakers   Exercises Supine Shoulder Press with Dowel - 10 reps - 1 sets - 1x daily - 7x weekly Supine Shoulder Flexion with Dowel - 10 reps - 1 sets - 1x daily - 7x weekly Supine Shoulder Abduction AAROM with Dowel - 10 reps - 1 sets - 1x daily - 7x weekly Supine Shoulder External Rotation AAROM with Dowel - 10 reps - 1 sets - 1x daily - 7x weekly Supine Shoulder Protraction with Dowel - 10 reps - 1 sets - 1x daily - 7x weekly

## 2019-04-07 ENCOUNTER — Encounter: Payer: Self-pay | Admitting: Physical Therapy

## 2019-04-07 ENCOUNTER — Ambulatory Visit: Payer: BC Managed Care – PPO | Admitting: Physical Therapy

## 2019-04-07 DIAGNOSIS — Z170 Estrogen receptor positive status [ER+]: Secondary | ICD-10-CM | POA: Diagnosis not present

## 2019-04-07 DIAGNOSIS — R293 Abnormal posture: Secondary | ICD-10-CM

## 2019-04-07 DIAGNOSIS — C50812 Malignant neoplasm of overlapping sites of left female breast: Secondary | ICD-10-CM | POA: Diagnosis not present

## 2019-04-07 DIAGNOSIS — Z483 Aftercare following surgery for neoplasm: Secondary | ICD-10-CM | POA: Diagnosis not present

## 2019-04-07 DIAGNOSIS — M25612 Stiffness of left shoulder, not elsewhere classified: Secondary | ICD-10-CM | POA: Diagnosis not present

## 2019-04-07 NOTE — Therapy (Signed)
[place], [place], [address] Phone: [phone]   Fax:  [phone]  Physical Therapy Treatment  Patient Details  Name: Tracy Knight MRN: [id number] Date of Birth: [birth year] Referring Provider (PT): Dr. Alphonsa Overall   Encounter Date: 04/07/2019  PT End of Session - 04/07/19 1728    Visit Number  6    Number of Visits  20    Date for PT Re-Evaluation  05/11/19    PT Start Time  1600    PT Stop Time  1640    PT Time Calculation (min)  40 min       Past Medical History:  Diagnosis Date  . Asthma   . Cancer ([place]) 11/2018   left breast cancer  . Diabetes mellitus without complication ([place])   . Dyslipidemia   . History of nuclear stress test 02/2009   exercise; normal pattern of perfusion; no significant ischemia demonstrated; low risk scan   . Hypertension     Past Surgical History:  Procedure Laterality Date  . BREAST RECONSTRUCTION WITH PLACEMENT OF TISSUE EXPANDER AND ALLODERM Left 02/18/2019   Procedure: LEFT BREAST RECONSTRUCTION WITH PLACEMENT OF TISSUE EXPANDER AND ALLODERM;  Surgeon: Irene Limbo, MD;  Location: [facility];  Service: Plastics;  Laterality: Left;  . CARDIAC CATHETERIZATION  07/2003   no evidence of CAD  . COLONOSCOPY N/A 02/17/2017   Procedure: COLONOSCOPY;  Surgeon: Daneil Dolin, MD;  [facility];  Service: Endoscopy;  Laterality: N/A;  10:00am  . MASTECTOMY W/ SENTINEL NODE BIOPSY Left 02/18/2019   Procedure: LEFT MASTECTOMY WITH LEFT AXILLARY SENTINEL LYMPH NODE BIOPSY;  Surgeon: Alphonsa Overall, MD;  Location: [facility];  Service: General;  Laterality: Left;  . POLYPECTOMY  02/17/2017   Procedure: POLYPECTOMY;  Surgeon: Daneil Dolin, MD;  [facility];  Service: Endoscopy;;  colon  . removal cyst on chin    . TRANSTHORACIC ECHOCARDIOGRAM  03/2007   EF=>55%; borderline LA enlargement; MV mildly  thickened/borderline MVP/mild MR    There were no vitals filed for this visit.  Subjective Assessment - 04/07/19 1607    Subjective  Pt says she wants to be able to get her arm over head because she has her simulation for radiation on Oct 13    Pertinent History  Patient was diagnosed on 11/26/2018 with left grade II-III invasive lobular carcinoma breast cancer. It is ER/PR positive, HER2 negative, and a Ki67 of 10%. Patient underwent a left mastectomy and sentinel node biopsy on 02/18/2019. One of 5 lymph nodes was positive but her Mammaprint came back low risk so no chemo is needed. She will undergo axillary and chest radiation.    Patient Stated Goals  Get my arm moving better.    Currently in Pain?  Yes    Pain Score  6     Pain Location  Shoulder    Pain Descriptors / Indicators  Tightness    Pain Type  Chronic pain    Pain Onset  More than a month ago    Pain Frequency  Intermittent    Aggravating Factors   raising arm    Pain Relieving Factors  rest         [facility] PT Assessment - 04/07/19 0001      Assessment   Medical Diagnosis  s/p left mastectomy SLNB with reconstruction    Referring Provider (PT)  Dr. Alphonsa Overall    Onset Date/Surgical  Date  02/18/19      Prior Function   Level of Independence  Independent                   [facility] Adult PT Treatment/Exercise - 04/07/19 0001      Manual Therapy   Manual Therapy  Soft tissue mobilization;Passive ROM    Manual therapy comments  gentle stretch into radiation position with large folded pillow under elboe and forearm so pt could relax into position. She turned head to left, no stretch on breast tissue     Soft tissue mobilization  in supine with visual on breast incision to make sure there is no skin tension, soft tissue work with thick massage cream to upper trap. deltoid, posterior shoudler muscles and pec major.     Passive ROM  PROM performed in ER, flexion, abd and IR with very easy end range stretching,  distraction and oscillations for improved movement.                PT Short Term Goals - 04/07/19 1726      PT SHORT TERM GOAL #1   Title  Pt will demonstrate 120 degrees flexion/abd within 3 weeks in order to demonstrate improvement of LUE functional ROM.    Baseline  98 degrees flexion, 77 degrees abd LUE    Period  Weeks    Status  New    Target Date  04/27/19        PT Long Term Goals - 04/07/19 1727      PT LONG TERM GOAL #1   Time  5    Period  Weeks    Status  On-going      PT LONG TERM GOAL #2   Title  Patient will increase left shoulder active flexion to >/= 140 degrees for increased ease reaching overhead.    Baseline  98 degrees flexion 04/06/19    Time  5    Period  Weeks    Status  On-going      PT LONG TERM GOAL #3   Title  Patient will increase left shoulder active abduction to >/= 150 degrees for increased ease reaching overhead.    Baseline  77 degrees 04/06/19    Time  5    Period  Weeks    Status  On-going      PT LONG TERM GOAL #4   Title  Patient will decrease DASH score to </= 5 for improved overall shoulder and upper extremity function.    Baseline  63% arm disability 04/06/19    Time  5    Period  Weeks    Status  On-going            Plan - 04/07/19 1722    Clinical Impression Statement  Pt received much relief from soft tissue work to upper trap area, posteior cervical, and deltoid muscles.  She was able to tolerate passibe motion toward the radiation position with pillow support.  Her breast incisions were closely monitored through the session and there was not pulling on them .    PT Frequency  2x / week    PT Duration  6 weeks    PT Treatment/Interventions  ADLs/Self Care Home Management;Therapeutic exercise;Patient/family education;Manual techniques;Manual lymph drainage;Therapeutic activities;Scar mobilization;Passive range of motion       Patient will benefit from skilled therapeutic intervention in order to improve the  following deficits and impairments:  Decreased knowledge of precautions, Impaired UE functional use, Pain,  Postural dysfunction, Decreased range of motion, Increased fascial restricitons  Visit Diagnosis: Abnormal posture  Aftercare following surgery for neoplasm  Stiffness of left shoulder, not elsewhere classified     Problem List Patient Active Problem List   Diagnosis Date Noted  . Breast cancer, left breast ([facility]) 02/18/2019  . Malignant neoplasm of overlapping sites of left breast in female, estrogen receptor positive ([place] [facility]) 12/17/2018  . Moderate obesity 03/13/2016  . Palpitations 03/06/2013  . Hyperlipidemia, mixed 03/06/2013  . Hypertension 02/14/2013  . Non-insulin treated type 2 diabetes mellitus ([place]) 02/14/2013   Donato Heinz. Owens Shark PT  Norwood Levo 04/07/2019, 5:30 PM  [place], [place], [address] Phone: [phone]   Fax:  [phone]  Name: DEARI SESSLER MRN: [id number] Date of Birth: 02/27/1961

## 2019-04-11 ENCOUNTER — Other Ambulatory Visit: Payer: Self-pay

## 2019-04-11 ENCOUNTER — Ambulatory Visit: Payer: BC Managed Care – PPO

## 2019-04-11 DIAGNOSIS — Z483 Aftercare following surgery for neoplasm: Secondary | ICD-10-CM | POA: Diagnosis not present

## 2019-04-11 DIAGNOSIS — C50812 Malignant neoplasm of overlapping sites of left female breast: Secondary | ICD-10-CM | POA: Diagnosis not present

## 2019-04-11 DIAGNOSIS — R293 Abnormal posture: Secondary | ICD-10-CM

## 2019-04-11 DIAGNOSIS — M25612 Stiffness of left shoulder, not elsewhere classified: Secondary | ICD-10-CM

## 2019-04-11 DIAGNOSIS — Z170 Estrogen receptor positive status [ER+]: Secondary | ICD-10-CM | POA: Diagnosis not present

## 2019-04-11 NOTE — Therapy (Signed)
[place], [place], [address] Phone: [phone]   Fax:  [phone]  Physical Therapy Treatment  Patient Details  Name: Tracy Knight MRN: [id number] Date of Birth: September 06, 1960 Referring Provider (PT): Dr. Alphonsa Overall   Encounter Date: 04/11/2019  PT End of Session - 04/11/19 1601    Visit Number  7    Number of Visits  20    Date for PT Re-Evaluation  05/11/19    PT Start Time  1500    PT Stop Time  1555    PT Time Calculation (min)  55 min    Activity Tolerance  Patient tolerated treatment well    Behavior During Therapy  [facility] for tasks assessed/performed       Past Medical History:  Diagnosis Date  . Asthma   . Cancer ([place]) 11/2018   left breast cancer  . Diabetes mellitus without complication ([place])   . Dyslipidemia   . History of nuclear stress test 02/2009   exercise; normal pattern of perfusion; no significant ischemia demonstrated; low risk scan   . Hypertension     Past Surgical History:  Procedure Laterality Date  . BREAST RECONSTRUCTION WITH PLACEMENT OF TISSUE EXPANDER AND ALLODERM Left 02/18/2019   Procedure: LEFT BREAST RECONSTRUCTION WITH PLACEMENT OF TISSUE EXPANDER AND ALLODERM;  Surgeon: Irene Limbo, MD;  Location: [facility];  Service: Plastics;  Laterality: Left;  . CARDIAC CATHETERIZATION  07/2003   no evidence of CAD  . COLONOSCOPY N/A 02/17/2017   Procedure: COLONOSCOPY;  Surgeon: Daneil Dolin, MD;  [facility];  Service: Endoscopy;  Laterality: N/A;  10:00am  . MASTECTOMY W/ SENTINEL NODE BIOPSY Left 02/18/2019   Procedure: LEFT MASTECTOMY WITH LEFT AXILLARY SENTINEL LYMPH NODE BIOPSY;  Surgeon: Alphonsa Overall, MD;  Location: [facility];  Service: General;  Laterality: Left;  . POLYPECTOMY  02/17/2017   Procedure: POLYPECTOMY;  Surgeon: Daneil Dolin, MD;  [facility];  Service: Endoscopy;;  colon  . removal  cyst on chin    . TRANSTHORACIC ECHOCARDIOGRAM  03/2007   EF=>55%; borderline LA enlargement; MV mildly thickened/borderline MVP/mild MR    There were no vitals filed for this visit.  Subjective Assessment - 04/11/19 1507    Subjective  Pt reports that she feels better after soft tissue mobilization last week.    Pertinent History  Patient was diagnosed on 11/26/2018 with left grade II-III invasive lobular carcinoma breast cancer. It is ER/PR positive, HER2 negative, and a Ki67 of 10%. Patient underwent a left mastectomy and sentinel node biopsy on 02/18/2019. One of 5 lymph nodes was positive but her Mammaprint came back low risk so no chemo is needed. She will undergo axillary and chest radiation.    Patient Stated Goals  Get my arm moving better.    Currently in Pain?  Yes    Pain Score  4     Pain Location  Shoulder    Pain Orientation  Left    Pain Descriptors / Indicators  Tightness    Pain Type  Chronic pain    Pain Onset  More than a month ago    Pain Frequency  Intermittent    Aggravating Factors   raising arm    Pain Relieving Factors  rest                       [facility] Adult PT Treatment/Exercise - 04/11/19 0001  Manual Therapy   Manual Therapy  Soft tissue mobilization;Passive ROM;Manual Lymphatic Drainage (MLD);Joint mobilization    Joint Mobilization  Grade II inferior and posterior 8x 8 sets at the glenohumeral joint in order to improve joint capsule extensibility and decrease stress on the L shoulder    Soft tissue mobilization  Palpable tightness/tenderness noted at the L deltoid, levator scapula, L middle/anterior scalene, L rhomboids and light stm over the L upper pectoralis major and insertion of pectoralis major at the humerus, Trigger point release at the L upper trapezius.     Manual Lymphatic Drainage (MLD)  B axillary and inguinal node stimulation, inter axillary anastomosis to the R and axillo-inguinal anastomosis on the L     Passive ROM  PROM  in all directions with Trigger point release at the upper trapezius and visual on L incision site over reconstruction to monitor for tension; on tension noted throughout incision was in tact at end of session.              PT Education - 04/11/19 1600    Education Details  Continue with easy stretching at home. Monitor incision during stetches.    Person(s) Educated  Patient    Methods  Explanation    Comprehension  Verbalized understanding       PT Short Term Goals - 04/07/19 1726      PT SHORT TERM GOAL #1   Title  Pt will demonstrate 120 degrees flexion/abd within 3 weeks in order to demonstrate improvement of LUE functional ROM.    Baseline  98 degrees flexion, 77 degrees abd LUE    Period  Weeks    Status  New    Target Date  04/27/19        PT Long Term Goals - 04/07/19 1727      PT LONG TERM GOAL #1   Time  5    Period  Weeks    Status  On-going      PT LONG TERM GOAL #2   Title  Patient will increase left shoulder active flexion to >/= 140 degrees for increased ease reaching overhead.    Baseline  98 degrees flexion 04/06/19    Time  5    Period  Weeks    Status  On-going      PT LONG TERM GOAL #3   Title  Patient will increase left shoulder active abduction to >/= 150 degrees for increased ease reaching overhead.    Baseline  77 degrees 04/06/19    Time  5    Period  Weeks    Status  On-going      PT LONG TERM GOAL #4   Title  Patient will decrease DASH score to </= 5 for improved overall shoulder and upper extremity function.    Baseline  63% arm disability 04/06/19    Time  5    Period  Weeks    Status  On-going            Plan - 04/11/19 1615    Clinical Impression Statement  Pt continues to receive relief from soft tissue mobilization before passive range of motion. She was able to tolerate position for radiation this session w/reports of just mild pain in her L shoulder following joint mobilizations to the glenohumeral joint and STM. MLD  was performed following STM in order to facilitate lymphatic flow and decrease risk for lymphedema. Pt incision was monitored closely this session with no signs of stress and incision  was intact at end of session. Pt will benefit from continued POC.    PT Frequency  2x / week    PT Duration  6 weeks    PT Treatment/Interventions  ADLs/Self Care Home Management;Therapeutic exercise;Patient/family education;Manual techniques;Manual lymph drainage;Therapeutic activities;Scar mobilization;Passive range of motion    PT Next Visit Plan  Assess STM/MLD and AAROM exercise sheet, measure AROM    PT Home Exercise Plan  Access Code: [email or web address],continue with current HEP       Patient will benefit from skilled therapeutic intervention in order to improve the following deficits and impairments:  Decreased knowledge of precautions, Impaired UE functional use, Pain, Postural dysfunction, Decreased range of motion, Increased fascial restricitons  Visit Diagnosis: Abnormal posture  Aftercare following surgery for neoplasm  Stiffness of left shoulder, not elsewhere classified  Malignant neoplasm of overlapping sites of left breast in female, estrogen receptor positive ([place])     Problem List Patient Active Problem List   Diagnosis Date Noted  . Breast cancer, left breast ([place]) 02/18/2019  . Malignant neoplasm of overlapping sites of left breast in female, estrogen receptor positive ([place]) 12/17/2018  . Moderate obesity 03/13/2016  . Palpitations 03/06/2013  . Hyperlipidemia, mixed 03/06/2013  . Hypertension 02/14/2013  . Non-insulin treated type 2 diabetes mellitus ([place]) 02/14/2013    Ander Purpura, PT 04/11/2019, 4:19 PM  [place], [place], [address] Phone: [phone]   Fax:  [phone]  Name: TALAJAH SLIMP MRN: [id number] Date of Birth: 06/27/1961

## 2019-04-12 ENCOUNTER — Ambulatory Visit
Admission: RE | Admit: 2019-04-12 | Discharge: 2019-04-12 | Disposition: A | Discharge status: Home / Self Care | Payer: BC Managed Care – PPO | Source: Ambulatory Visit | Attending: Radiation Oncology | Admitting: Radiation Oncology

## 2019-04-12 ENCOUNTER — Other Ambulatory Visit: Payer: Self-pay

## 2019-04-12 DIAGNOSIS — C50812 Malignant neoplasm of overlapping sites of left female breast: Secondary | ICD-10-CM | POA: Diagnosis not present

## 2019-04-12 DIAGNOSIS — Z510 Encounter for antineoplastic radiation therapy: Secondary | ICD-10-CM | POA: Diagnosis not present

## 2019-04-12 DIAGNOSIS — Z170 Estrogen receptor positive status [ER+]: Secondary | ICD-10-CM | POA: Insufficient documentation

## 2019-04-13 NOTE — Progress Notes (Signed)
  Radiation Oncology         [phone]) [phone] ________________________________  Name: Tracy Knight MRN: [id number]  Date: 04/12/2019  DOB: 06-02-1961  Diagnosis DIAGNOSIS:     ICD-10-CM   1. Malignant neoplasm of overlapping sites of left breast in female, estrogen receptor positive ([place])  C50.812    Z17.0      SIMULATION AND TREATMENT PLANNING NOTE  The patient presented for simulation prior to beginning her course of radiation treatment for her diagnosis of left-sided breast cancer. The patient was placed in a supine position on a breast board. A customized vac-lock bag was also constructed and this complex treatment device will be used on a daily basis during her treatment. In this fashion, a CT scan was obtained through the chest area and an isocenter was placed near the chest wall at the upper aspect of the right chest. A breath-hold technique has also been evaluated to determine if this significantly improves the spatial relationship between the target region and the heart. Based on this analysis, a breath-hold technique has been ordered for the patient's treatment.  The patient will be planned to receive a course of radiation initially to a dose of 50.4 gray. This will consist of a 4 field technique targeting the left chest wall as well as the supraclavicular region. Therefore 2 customized medial and lateral tangent fields have been created targeting the chest wall, and also 2 additional customized fields have been designed to treat the supraclavicular region both with a left supraclavicular field and a left posterior axillary boost field. A forward planning/reduced field technique will also be evaluated to determine if this significantly improves the dose homogeneity of the overall plan. Therefore, additional customized blocks/fields may be necessary.  This initial treatment will be accomplished at 1.8 gray per fraction.   The initial plan will consist of a 3-D conformal technique. The target  volume/scar, heart and lungs have been contoured and dose volume histograms of each of these structures will be evaluated as part of the 3-D conformal treatment planning process.   It is anticipated that the patient will then receive a 10 gray boost to the surgical scar. This will be accomplished at 2 gray per fraction. The final anticipated total dose therefore will correspond to 60.4 gray.   Special treatment procedure was performed today due to the extra time and effort required by myself to plan and prepare this patient for deep inspiration breath hold technique.  I have determined cardiac sparing to be of benefit to this patient to prevent long term cardiac damage due to radiation of the heart.  Bellows were placed on the patient's abdomen. To facilitate cardiac sparing, the patient was coached by the radiation therapists on breath hold techniques and breathing practice was performed. Practice waveforms were obtained. The patient was then scanned while maintaining breath hold in the treatment position.  This image was then transferred over to the imaging specialist. The imaging specialist then created a fusion of the free breathing and breath hold scans using the chest wall as the stable structure. I personally reviewed the fusion in axial, coronal and sagittal image planes.  Excellent cardiac sparing was obtained.  I felt the patient is an appropriate candidate for breath hold and the patient will be treated as such.  The image fusion was then reviewed with the patient to reinforce the necessity of reproducible breath hold.     _______________________________   Jodelle Gross, MD, PhD

## 2019-04-13 NOTE — Progress Notes (Signed)
  Radiation Oncology         [phone]) [phone] ________________________________  Name: Tracy Knight MRN: [id number]  Date: 04/12/2019  DOB: [birth year]  Optical Surface Tracking Plan:  Since intensity modulated radiotherapy (IMRT) and 3D conformal radiation treatment methods are predicated on accurate and precise positioning for treatment, intrafraction motion monitoring is medically necessary to ensure accurate and safe treatment delivery.  The ability to quantify intrafraction motion without excessive ionizing radiation dose can only be performed with optical surface tracking. Accordingly, surface imaging offers the opportunity to obtain 3D measurements of patient position throughout IMRT and 3D treatments without excessive radiation exposure.  I am ordering optical surface tracking for this patient's upcoming course of radiotherapy. ________________________________  Kyung Rudd, MD 04/13/2019 2:21 PM    Reference:   Particia Jasper, et al. Surface imaging-based analysis of intrafraction motion for breast radiotherapy patients.Journal of [place], n. 6, nov. 2014. ISSN [id number].   Available at: <http://www.jacmp.org/index.php/jacmp/article/view/4957>.

## 2019-04-14 ENCOUNTER — Ambulatory Visit: Payer: BC Managed Care – PPO

## 2019-04-14 ENCOUNTER — Other Ambulatory Visit: Payer: Self-pay

## 2019-04-14 DIAGNOSIS — R293 Abnormal posture: Secondary | ICD-10-CM | POA: Diagnosis not present

## 2019-04-14 DIAGNOSIS — Z483 Aftercare following surgery for neoplasm: Secondary | ICD-10-CM | POA: Diagnosis not present

## 2019-04-14 DIAGNOSIS — Z170 Estrogen receptor positive status [ER+]: Secondary | ICD-10-CM | POA: Diagnosis not present

## 2019-04-14 DIAGNOSIS — M25612 Stiffness of left shoulder, not elsewhere classified: Secondary | ICD-10-CM | POA: Diagnosis not present

## 2019-04-14 DIAGNOSIS — C50812 Malignant neoplasm of overlapping sites of left female breast: Secondary | ICD-10-CM | POA: Diagnosis not present

## 2019-04-14 NOTE — Therapy (Signed)
[place], [place], [address] Phone: [phone]   Fax:  [phone]  Physical Therapy Treatment  Patient Details  Name: Tracy Knight MRN: [id number] Date of Birth: [birth year] Referring Provider (PT): Dr. Alphonsa Overall   Encounter Date: 04/14/2019  PT End of Session - 04/14/19 1502    Visit Number  8    Number of Visits  20    Date for PT Re-Evaluation  05/11/19    PT Start Time  1500    PT Stop Time  1555    PT Time Calculation (min)  55 min    Activity Tolerance  Patient tolerated treatment well    Behavior During Therapy  [facility] for tasks assessed/performed       Past Medical History:  Diagnosis Date  . Asthma   . Cancer ([place]) 11/2018   left breast cancer  . Diabetes mellitus without complication ([place])   . Dyslipidemia   . History of nuclear stress test 02/2009   exercise; normal pattern of perfusion; no significant ischemia demonstrated; low risk scan   . Hypertension     Past Surgical History:  Procedure Laterality Date  . BREAST RECONSTRUCTION WITH PLACEMENT OF TISSUE EXPANDER AND ALLODERM Left 02/18/2019   Procedure: LEFT BREAST RECONSTRUCTION WITH PLACEMENT OF TISSUE EXPANDER AND ALLODERM;  Surgeon: Irene Limbo, MD;  Location: [facility];  Service: Plastics;  Laterality: Left;  . CARDIAC CATHETERIZATION  07/2003   no evidence of CAD  . COLONOSCOPY N/A 02/17/2017   Procedure: COLONOSCOPY;  Surgeon: Daneil Dolin, MD;  [facility];  Service: Endoscopy;  Laterality: N/A;  10:00am  . MASTECTOMY W/ SENTINEL NODE BIOPSY Left 02/18/2019   Procedure: LEFT MASTECTOMY WITH LEFT AXILLARY SENTINEL LYMPH NODE BIOPSY;  Surgeon: Alphonsa Overall, MD;  Location: [facility];  Service: General;  Laterality: Left;  . POLYPECTOMY  02/17/2017   Procedure: POLYPECTOMY;  Surgeon: Daneil Dolin, MD;  [facility];  Service: Endoscopy;;  colon  . removal  cyst on chin    . TRANSTHORACIC ECHOCARDIOGRAM  03/2007   EF=>55%; borderline LA enlargement; MV mildly thickened/borderline MVP/mild MR    There were no vitals filed for this visit.  Subjective Assessment - 04/14/19 1502    Subjective  Pt reports that she is feeling better after her last session. She reports that she is performing her exercises at home mostly every day.    Pertinent History  Patient was diagnosed on 11/26/2018 with left grade II-III invasive lobular carcinoma breast cancer. It is ER/PR positive, HER2 negative, and a Ki67 of 10%. Patient underwent a left mastectomy and sentinel node biopsy on 02/18/2019. One of 5 lymph nodes was positive but her Mammaprint came back low risk so no chemo is needed. She will undergo axillary and chest radiation.    Patient Stated Goals  Get my arm moving better.    Currently in Pain?  No/denies         [facility] PT Assessment - 04/14/19 0001      AROM   Left Shoulder Flexion  110 Degrees    Left Shoulder ABduction  110 Degrees    Left Shoulder Internal Rotation  68 Degrees    Left Shoulder External Rotation  75 Degrees                   [facility] Adult PT Treatment/Exercise - 04/14/19 0001      Shoulder Exercises: Supine  Other Supine Exercises  chest press w/dowel, Chest press w/shoulder flexion with dowel 20x each, VC for avoiding pain or anything more than very light stretching. Shoulder protraction with dowel with tactile/VC for correct movement 20x.     Other Supine Exercises  ER with hands under head for 1 min 2x with VC to avoid pain.       Shoulder Exercises: Standing   Flexion  AAROM;Both;10 reps    Flexion Limitations  5 second hold, VC to prevent shoulder hiking and to avoid pain    ABduction  AAROM;Both;10 reps    ABduction Limitations  5 second hold, VC to prevent lateral trunk bent and decrease shoulder hiking with greater cueing on the R than the L      Manual Therapy   Manual Therapy  Soft tissue  mobilization;Passive ROM;Manual Lymphatic Drainage (MLD);Joint mobilization;Myofascial release    Joint Mobilization  --    Soft tissue mobilization  Palpable tightness noted at the Bil pectoralis major and L deltoid/upper trapezius; STM decreased tightness/tenderness throughout.     Myofascial Release  --    Manual Lymphatic Drainage (MLD)  B axillary and inguinal node stimulation, inter axillary anastomosis to the R and axillo-inguinal anastomosis on the L     Passive ROM  PROM in all directions with Trigger point release at the upper trapezius and visual on L incision site over reconstruction to monitor for tension; on tension noted throughout incision was in tact at end of session.              PT Education - 04/14/19 1534    Education Details  Access Code: [email or web address] Pt was instructed on new HEP with VC and demonstration with good form and return demonstration. Discussed the importance of maintaining tension off of the incision and monitoring throughout exercises.    Person(s) Educated  Patient    Methods  Explanation;Demonstration;Verbal cues;Handout    Comprehension  Verbalized understanding;Returned demonstration       PT Short Term Goals - 04/07/19 1726      PT SHORT TERM GOAL #1   Title  Pt will demonstrate 120 degrees flexion/abd within 3 weeks in order to demonstrate improvement of LUE functional ROM.    Baseline  98 degrees flexion, 77 degrees abd LUE    Period  Weeks    Status  New    Target Date  04/27/19        PT Long Term Goals - 04/07/19 1727      PT LONG TERM GOAL #1   Time  5    Period  Weeks    Status  On-going      PT LONG TERM GOAL #2   Title  Patient will increase left shoulder active flexion to >/= 140 degrees for increased ease reaching overhead.    Baseline  98 degrees flexion 04/06/19    Time  5    Period  Weeks    Status  On-going      PT LONG TERM GOAL #3   Title  Patient will increase left shoulder active abduction to >/= 150 degrees for  increased ease reaching overhead.    Baseline  77 degrees 04/06/19    Time  5    Period  Weeks    Status  On-going      PT LONG TERM GOAL #4   Title  Patient will decrease DASH score to </= 5 for improved overall shoulder and upper extremity function.    Baseline  63% arm disability 04/06/19    Time  5    Period  Weeks    Status  On-going            Plan - 04/14/19 1502    Clinical Impression Statement  Pt continues to receive relief from soft tissue mobilizatoin and reports pain has decreased. She demonstrates improves AROM since her initial evaluation (see measurements). Pt at home activities were increased to [facility] activities in standing w/o an increase in pain and an emphasis on monitoring incision and preventing stress at the incision site. Palpable tightness/tenderness continues at Bil pectoralis major, L deltoid and L upper trapezius; decreased following light STM. Assessed mobility around scar and over L breast today with decreased mobility noted over the incision; to protect skin integrity no myofascial release was performed today. Pt will benefit form continued POC.    Stability/Clinical Decision Making  Stable/Uncomplicated    PT Frequency  2x / week    PT Duration  6 weeks    PT Treatment/Interventions  ADLs/Self Care Home Management;Therapeutic exercise;Patient/family education;Manual techniques;Manual lymph drainage;Therapeutic activities;Scar mobilization;Passive range of motion    PT Next Visit Plan  Assess STM/MLD and AAROM HEP    PT Home Exercise Plan  Access Code: [email or web address], Perform revised HEP for AAROM activities into standing.    Consulted and Agree with Plan of Care  Patient       Patient will benefit from skilled therapeutic intervention in order to improve the following deficits and impairments:  Decreased knowledge of precautions, Impaired UE functional use, Pain, Postural dysfunction, Decreased range of motion, Increased fascial restricitons  Visit  Diagnosis: Abnormal posture  Aftercare following surgery for neoplasm  Stiffness of left shoulder, not elsewhere classified  Malignant neoplasm of overlapping sites of left breast in female, estrogen receptor positive ([place])     Problem List Patient Active Problem List   Diagnosis Date Noted  . Breast cancer, left breast ([place]) 02/18/2019  . Malignant neoplasm of overlapping sites of left breast in female, estrogen receptor positive ([place]) 12/17/2018  . Moderate obesity 03/13/2016  . Palpitations 03/06/2013  . Hyperlipidemia, mixed 03/06/2013  . Hypertension 02/14/2013  . Non-insulin treated type 2 diabetes mellitus ([place]) 02/14/2013    Ander Purpura, PT 04/14/2019, 4:00 PM  [place], [place], [address] Phone: [phone]   Fax:  [phone]  Name: Tracy Knight MRN: [id number] Date of Birth: 1960-12-16

## 2019-04-14 NOTE — Patient Instructions (Addendum)
  URL: [email or web address]  Date: 04/14/2019  Prepared by: Tomma Rakers   Exercises Supine Shoulder Flexion with Dowel - 10 reps - 1 sets - 1x daily - 7x weekly Supine Shoulder Protraction with Dowel - 10 reps - 1 sets - 1x daily - 7x weekly Supine Chest Stretch with Elbows Bent - 3 reps - 1 sets - 30 seconds hold - 1x daily - 7x weekly Shoulder Flexion Wall Slide with Towel - 10 reps - 1 sets - 1x daily                            - 7x weekly Standing Shoulder Abduction Slides at Wall - 10 reps - 1 sets - 1x daily - 7x weekly

## 2019-04-19 ENCOUNTER — Ambulatory Visit
Admission: RE | Admit: 2019-04-19 | Discharge: 2019-04-19 | Disposition: A | Discharge status: Home / Self Care | Payer: BC Managed Care – PPO | Source: Ambulatory Visit | Attending: Radiation Oncology | Admitting: Radiation Oncology

## 2019-04-19 ENCOUNTER — Other Ambulatory Visit: Payer: Self-pay

## 2019-04-19 ENCOUNTER — Ambulatory Visit: Payer: BC Managed Care – PPO

## 2019-04-19 DIAGNOSIS — Z483 Aftercare following surgery for neoplasm: Secondary | ICD-10-CM

## 2019-04-19 DIAGNOSIS — C50812 Malignant neoplasm of overlapping sites of left female breast: Secondary | ICD-10-CM | POA: Diagnosis not present

## 2019-04-19 DIAGNOSIS — Z170 Estrogen receptor positive status [ER+]: Secondary | ICD-10-CM | POA: Diagnosis not present

## 2019-04-19 DIAGNOSIS — M25612 Stiffness of left shoulder, not elsewhere classified: Secondary | ICD-10-CM | POA: Diagnosis not present

## 2019-04-19 DIAGNOSIS — R293 Abnormal posture: Secondary | ICD-10-CM | POA: Diagnosis not present

## 2019-04-19 DIAGNOSIS — Z510 Encounter for antineoplastic radiation therapy: Secondary | ICD-10-CM | POA: Diagnosis not present

## 2019-04-19 NOTE — Therapy (Signed)
[place], [place], [address] Phone: [phone]   Fax:  [phone]  Physical Therapy Treatment  Patient Details  Name: Tracy Knight MRN: [id number] Date of Birth: May 06, 1961 Referring Provider (PT): Dr. Alphonsa Overall   Encounter Date: 04/19/2019  PT End of Session - 04/19/19 0804    Visit Number  9    Number of Visits  20    Date for PT Re-Evaluation  05/11/19    PT Start Time  0800    PT Stop Time  0856    PT Time Calculation (min)  56 min    Activity Tolerance  Patient tolerated treatment well    Behavior During Therapy  [facility] for tasks assessed/performed       Past Medical History:  Diagnosis Date  . Asthma   . Cancer ([place]) 11/2018   left breast cancer  . Diabetes mellitus without complication ([place])   . Dyslipidemia   . History of nuclear stress test 02/2009   exercise; normal pattern of perfusion; no significant ischemia demonstrated; low risk scan   . Hypertension     Past Surgical History:  Procedure Laterality Date  . BREAST RECONSTRUCTION WITH PLACEMENT OF TISSUE EXPANDER AND ALLODERM Left 02/18/2019   Procedure: LEFT BREAST RECONSTRUCTION WITH PLACEMENT OF TISSUE EXPANDER AND ALLODERM;  Surgeon: Irene Limbo, MD;  Location: [facility];  Service: Plastics;  Laterality: Left;  . CARDIAC CATHETERIZATION  07/2003   no evidence of CAD  . COLONOSCOPY N/A 02/17/2017   Procedure: COLONOSCOPY;  Surgeon: Daneil Dolin, MD;  [facility];  Service: Endoscopy;  Laterality: N/A;  10:00am  . MASTECTOMY W/ SENTINEL NODE BIOPSY Left 02/18/2019   Procedure: LEFT MASTECTOMY WITH LEFT AXILLARY SENTINEL LYMPH NODE BIOPSY;  Surgeon: Alphonsa Overall, MD;  Location: [facility];  Service: General;  Laterality: Left;  . POLYPECTOMY  02/17/2017   Procedure: POLYPECTOMY;  Surgeon: Daneil Dolin, MD;  [facility];  Service: Endoscopy;;  colon  . removal  cyst on chin    . TRANSTHORACIC ECHOCARDIOGRAM  03/2007   EF=>55%; borderline LA enlargement; MV mildly thickened/borderline MVP/mild MR    There were no vitals filed for this visit.  Subjective Assessment - 04/19/19 0804    Subjective  Pt reports that she is feeling pretty good and has been working on her new HEP at home.    Pertinent History  Patient was diagnosed on 11/26/2018 with left grade II-III invasive lobular carcinoma breast cancer. It is ER/PR positive, HER2 negative, and a Ki67 of 10%. Patient underwent a left mastectomy and sentinel node biopsy on 02/18/2019. One of 5 lymph nodes was positive but her Mammaprint came back low risk so no chemo is needed. She will undergo axillary and chest radiation.    Patient Stated Goals  Get my arm moving better.    Currently in Pain?  Yes    Pain Score  1     Pain Location  Shoulder    Pain Orientation  Left    Pain Descriptors / Indicators  Tightness    Pain Type  Chronic pain    Pain Onset  More than a month ago    Pain Frequency  Intermittent    Aggravating Factors   raising arm first thing in the morning but starts to feel better as she continues moving it.    Pain Relieving Factors  rest    Multiple Pain Sites  No  [facility] Adult PT Treatment/Exercise - 04/19/19 0001      Shoulder Exercises: Supine   Other Supine Exercises  Chest press w/shoulder flexion with dowel 20x each, VC for avoiding pain or anything more than very light stretching. Signiifcant improvement in elbow extension during supine overhead activities. Shoulder protraction with dowel continues to require tactile at elbows for correct movement 20x.     Other Supine Exercises  ER with hands under head for 1 min 2x with VC to avoid pain and hold position for full length of time.       Shoulder Exercises: Standing   Flexion  AAROM;Both;10 reps    Flexion Limitations  wall wash 5 second hold. VC for longer hold    ABduction  AAROM;Both;10  reps    ABduction Limitations  wall wash, 5 second hold     Other Standing Exercises  AROM flexion/abd at the wall 10x each w/demonstration and tactile cueing/VC for the LUE to straighten the elbow and prevent wrist extension. tactile cueing into the correct plane of movement for both flexion and abduction    Other Standing Exercises  L elbow extension from various heights with hand on the wall into full terminal extension to stretch the biceps and decrease elbow flexion during overhead activiites.10x at 5 different height placements starting at shoulder level and up. PNF D2 pattern Bil 10x standing at the wall demonstration for movement. Improved elbow extension following previous exercise.       Shoulder Exercises: Pulleys   Flexion  2 minutes    Flexion Limitations  VC for not leaning back in the chair or side to side to prevent compensation    ABduction  2 minutes    ABduction Limitations  Demonstration for movement.       Manual Therapy   Manual Therapy  Soft tissue mobilization;Passive ROM;Manual Lymphatic Drainage (MLD);Joint mobilization    Joint Mobilization  Grade II inferior and posterior 8x 8 sets at the glenohumeral joint in order to improve joint capsule extensibility and decrease stress on the L shoulder    Soft tissue mobilization  L deltoid/pectoralis major tightness is improving but continues; decreased minimally following light STM.     Manual Lymphatic Drainage (MLD)  B axillary and inguinal node stimulation, inter axillary anastomosis to the R and axillo-inguinal anastomosis on the L     Passive ROM  PROM in all directions with Trigger point release at the upper trapezius and visual on L incision site over reconstruction to monitor for tension; on tension noted throughout incision was in tact at end of session.              PT Education - 04/19/19 0838    Education Details  Access Code: [email or web address] pt will continue with current HEP at this time.    Methods  Explanation     Comprehension  Verbalized understanding       PT Short Term Goals - 04/07/19 1726      PT SHORT TERM GOAL #1   Title  Pt will demonstrate 120 degrees flexion/abd within 3 weeks in order to demonstrate improvement of LUE functional ROM.    Baseline  98 degrees flexion, 77 degrees abd LUE    Period  Weeks    Status  New    Target Date  04/27/19        PT Long Term Goals - 04/07/19 1727      PT LONG TERM GOAL #1   Time  5  Period  Weeks    Status  On-going      PT LONG TERM GOAL #2   Title  Patient will increase left shoulder active flexion to >/= 140 degrees for increased ease reaching overhead.    Baseline  98 degrees flexion 04/06/19    Time  5    Period  Weeks    Status  On-going      PT LONG TERM GOAL #3   Title  Patient will increase left shoulder active abduction to >/= 150 degrees for increased ease reaching overhead.    Baseline  77 degrees 04/06/19    Time  5    Period  Weeks    Status  On-going      PT LONG TERM GOAL #4   Title  Patient will decrease DASH score to </= 5 for improved overall shoulder and upper extremity function.    Baseline  63% arm disability 04/06/19    Time  5    Period  Weeks    Status  On-going            Plan - 04/19/19 0803    Clinical Impression Statement  Pt presents to phyical therapy with continiued tightness/tenderness in the L shoulder that is improving. Pt was able to tolerate AROM ther-ex this session good results. Occasional VC for form and direction. Tactile cueing to prevent compensation at the L elbow and wrist that improved following cueing and proprioceptive input with elbow extension on the wall with handplace at shoulder height and above. Palpable tightness/tendernes continues at the L deltoid/pectoralis major; decreases minimally followoing light STM followed by MLD. PROM mobility continues to improved; joint mobilizations and oscillations continue to help pt reach greater pain free range in the shoulder. Continue  with current POC.    Rehab Potential  Excellent    PT Frequency  2x / week    PT Duration  6 weeks    PT Treatment/Interventions  ADLs/Self Care Home Management;Therapeutic exercise;Patient/family education;Manual techniques;Manual lymph drainage;Therapeutic activities;Scar mobilization;Passive range of motion    PT Home Exercise Plan  Access Code: [email or web address], Perform revised HEP for AAROM activities into standing.    Consulted and Agree with Plan of Care  Patient       Patient will benefit from skilled therapeutic intervention in order to improve the following deficits and impairments:  Decreased knowledge of precautions, Impaired UE functional use, Pain, Postural dysfunction, Decreased range of motion, Increased fascial restricitons  Visit Diagnosis: Abnormal posture  Aftercare following surgery for neoplasm  Stiffness of left shoulder, not elsewhere classified  Malignant neoplasm of overlapping sites of left breast in female, estrogen receptor positive ([place])     Problem List Patient Active Problem List   Diagnosis Date Noted  . Breast cancer, left breast ([place]) 02/18/2019  . Malignant neoplasm of overlapping sites of left breast in female, estrogen receptor positive ([place]) 12/17/2018  . Moderate obesity 03/13/2016  . Palpitations 03/06/2013  . Hyperlipidemia, mixed 03/06/2013  . Hypertension 02/14/2013  . Non-insulin treated type 2 diabetes mellitus ([place]) 02/14/2013    Ander Purpura, PT 04/19/2019, 8:57 AM  [facility] [place], [place], [address] Phone: [phone]   Fax:  [phone]  Name: Tracy Knight MRN: [id number] Date of Birth: Oct 26, 1960

## 2019-04-20 ENCOUNTER — Ambulatory Visit
Admission: RE | Admit: 2019-04-20 | Discharge: 2019-04-20 | Disposition: A | Discharge status: Home / Self Care | Payer: BC Managed Care – PPO | Source: Ambulatory Visit | Attending: Radiation Oncology | Admitting: Radiation Oncology

## 2019-04-20 ENCOUNTER — Other Ambulatory Visit: Payer: Self-pay

## 2019-04-20 DIAGNOSIS — Z170 Estrogen receptor positive status [ER+]: Secondary | ICD-10-CM | POA: Diagnosis not present

## 2019-04-20 DIAGNOSIS — C50812 Malignant neoplasm of overlapping sites of left female breast: Secondary | ICD-10-CM | POA: Diagnosis not present

## 2019-04-20 DIAGNOSIS — Z510 Encounter for antineoplastic radiation therapy: Secondary | ICD-10-CM | POA: Diagnosis not present

## 2019-04-21 ENCOUNTER — Other Ambulatory Visit: Payer: Self-pay

## 2019-04-21 ENCOUNTER — Ambulatory Visit
Admission: RE | Admit: 2019-04-21 | Discharge: 2019-04-21 | Disposition: A | Discharge status: Home / Self Care | Payer: BC Managed Care – PPO | Source: Ambulatory Visit | Attending: Radiation Oncology | Admitting: Radiation Oncology

## 2019-04-21 DIAGNOSIS — Z510 Encounter for antineoplastic radiation therapy: Secondary | ICD-10-CM | POA: Diagnosis not present

## 2019-04-21 DIAGNOSIS — C50812 Malignant neoplasm of overlapping sites of left female breast: Secondary | ICD-10-CM | POA: Diagnosis not present

## 2019-04-21 DIAGNOSIS — Z170 Estrogen receptor positive status [ER+]: Secondary | ICD-10-CM | POA: Diagnosis not present

## 2019-04-22 ENCOUNTER — Ambulatory Visit: Payer: BC Managed Care – PPO

## 2019-04-22 ENCOUNTER — Other Ambulatory Visit: Payer: Self-pay

## 2019-04-22 ENCOUNTER — Ambulatory Visit
Admission: RE | Admit: 2019-04-22 | Discharge: 2019-04-22 | Disposition: A | Discharge status: Home / Self Care | Payer: BC Managed Care – PPO | Source: Ambulatory Visit | Attending: Radiation Oncology | Admitting: Radiation Oncology

## 2019-04-22 DIAGNOSIS — Z483 Aftercare following surgery for neoplasm: Secondary | ICD-10-CM

## 2019-04-22 DIAGNOSIS — Z510 Encounter for antineoplastic radiation therapy: Secondary | ICD-10-CM | POA: Diagnosis not present

## 2019-04-22 DIAGNOSIS — C50812 Malignant neoplasm of overlapping sites of left female breast: Secondary | ICD-10-CM

## 2019-04-22 DIAGNOSIS — M25612 Stiffness of left shoulder, not elsewhere classified: Secondary | ICD-10-CM | POA: Diagnosis not present

## 2019-04-22 DIAGNOSIS — Z170 Estrogen receptor positive status [ER+]: Secondary | ICD-10-CM | POA: Diagnosis not present

## 2019-04-22 DIAGNOSIS — R293 Abnormal posture: Secondary | ICD-10-CM | POA: Diagnosis not present

## 2019-04-22 MED ORDER — RADIAPLEXRX EX GEL
Freq: Once | CUTANEOUS | Status: AC
  Administered 2019-04-22: 16:00:00 via TOPICAL

## 2019-04-22 MED ORDER — ALRA NON-METALLIC DEODORANT (RAD-ONC)
1.0000 "application " | Freq: Once | TOPICAL | Status: AC
  Administered 2019-04-22: 1 via TOPICAL

## 2019-04-22 NOTE — Therapy (Signed)
[facility] [address] [place], [place], [address] Phone: [phone]   Fax:  [phone]  Physical Therapy Treatment  Patient Details  Name: Tracy Knight MRN: [id number] Date of Birth: 01/09/1961 Referring Provider (PT): Dr. David Newman   Encounter Date: 04/22/2019  PT End of Session - 04/22/19 0805    Visit Number  10    Date for PT Re-Evaluation  05/11/19    PT Start Time  0802    PT Stop Time  0855    PT Time Calculation (min)  53 min    Activity Tolerance  Patient tolerated treatment well    Behavior During Therapy  WFL for tasks assessed/performed       Past Medical History:  Diagnosis Date  . Asthma   . Cancer ([facility]) 11/2018   left breast cancer  . Diabetes mellitus without complication ([facility])   . Dyslipidemia   . History of nuclear stress test 02/2009   exercise; normal pattern of perfusion; no significant ischemia demonstrated; low risk scan   . Hypertension     Past Surgical History:  Procedure Laterality Date  . BREAST RECONSTRUCTION WITH PLACEMENT OF TISSUE EXPANDER AND ALLODERM Left 02/18/2019   Procedure: LEFT BREAST RECONSTRUCTION WITH PLACEMENT OF TISSUE EXPANDER AND ALLODERM;  Surgeon: Thimmappa, Brinda, MD;  Location: [facility];  Service: Plastics;  Laterality: Left;  . CARDIAC CATHETERIZATION  07/2003   no evidence of CAD  . COLONOSCOPY N/A 02/17/2017   Procedure: COLONOSCOPY;  Surgeon: Rourk, Robert M, MD;  [facility];  Service: Endoscopy;  Laterality: N/A;  10:00am  . MASTECTOMY W/ SENTINEL NODE BIOPSY Left 02/18/2019   Procedure: LEFT MASTECTOMY WITH LEFT AXILLARY SENTINEL LYMPH NODE BIOPSY;  Surgeon: Newman, David, MD;  Location: [facility];  Service: General;  Laterality: Left;  . POLYPECTOMY  02/17/2017   Procedure: POLYPECTOMY;  Surgeon: Rourk, Robert M, MD;  [facility];  Service: Endoscopy;;  colon  . removal cyst on chin    .  TRANSTHORACIC ECHOCARDIOGRAM  03/2007   EF=>55%; borderline LA enlargement; MV mildly thickened/borderline MVP/mild MR    There were no vitals filed for this visit.  Subjective Assessment - 04/22/19 0803    Subjective  Pt reports that she is feeling pretty good and continues to work on her HEP at home.    Pertinent History  Patient was diagnosed on 11/26/2018 with left grade II-III invasive lobular carcinoma breast cancer. It is ER/PR positive, HER2 negative, and a Ki67 of 10%. Patient underwent a left mastectomy and sentinel node biopsy on 02/18/2019. One of 5 lymph nodes was positive but her Mammaprint came back low risk so no chemo is needed. She will undergo axillary and chest radiation.    Patient Stated Goals  Get my arm moving better.    Currently in Pain?  Yes    Pain Score  2     Pain Location  Shoulder    Pain Orientation  Left    Pain Descriptors / Indicators  Tightness    Pain Type  Chronic pain    Pain Onset  More than a month ago    Pain Frequency  Intermittent    Aggravating Factors   Initially raising her arm first thing in the morning but feels better with movement.    Pain Relieving Factors  rest    Multiple Pain Sites  No                         [facility] Adult PT Treatment/Exercise - 04/22/19 0001      Shoulder Exercises: Supine   Horizontal ABduction  Strengthening;Both;10 reps    Theraband Level (Shoulder Horizontal ABduction)  Level 1 (Yellow)    Horizontal ABduction Limitations  Pt requires VC and demonstration for correct movement.     External Rotation  Strengthening;Both;10 reps    Theraband Level (Shoulder External Rotation)  Level 1 (Yellow)    External Rotation Limitations  VC,demonstration for movement and tactile cueing at the L arm to prevent compensation with elbow extension.     Flexion  Strengthening;Both;10 reps    Theraband Level (Shoulder Flexion)  Level 1 (Yellow)    Flexion Limitations  Demonstration for correct movement and to  maintain tension on the band throughout.     Diagonals  Strengthening;Both;10 reps    Theraband Level (Shoulder Diagonals)  Level 1 (Yellow)    Diagonals Limitations  VC and demonstration for form and tactile cueing to keep the L shoulder on the table throughout movement.     Other Supine Exercises  Chest press w/protraction 10x, Chest press w/shoulder flexion with dowel 20x each, VC for avoiding pain or anything more than very light stretching. Signiifcant improvement in elbow extension during supine overhead activities. Shoulder protraction with dowel continues to require tactile at elbows for correct movement 20x.     Other Supine Exercises  ER with hands under head for 1 min 2x with VC to avoid pain and hold position for full length of time.       Shoulder Exercises: Seated   Other Seated Exercises  Seated trunk rotation to the L w/end range scapular stretch to improve ROM at the L shoulder.       Shoulder Exercises: Standing   Flexion  AAROM;Both;10 reps    Flexion Limitations  wall wash 5 second hold. VC for longer hold    ABduction  AAROM;Both;10 reps    ABduction Limitations  wall wash, 5 second hold for improved movement prior to AROM    Other Standing Exercises  AROM flexion/abd at the wall 10x each w/VC for elbow straight throughout flexion and abduction intermittently due to weaknes improved this session w/less compensation.       Manual Therapy   Manual Therapy  Soft tissue mobilization;Passive ROM;Manual Lymphatic Drainage (MLD);Joint mobilization    Joint Mobilization  Grade II inferior and posterior 8x 8 sets at the glenohumeral joint in order to improve joint capsule extensibility and decrease stress on the L shoulder    Soft tissue mobilization  L deltoid/pectoralis major tightness is improving but continues; decreased minimally following light STM.     Manual Lymphatic Drainage (MLD)  B axillary and inguinal node stimulation, inter axillary anastomosis to the R and  axillo-inguinal anastomosis on the L     Passive ROM  PROM in all directions with visual on L incision site over reconstruction to monitor for tension; on tension noted throughout incision was in tact at end of session.              PT Education - 04/22/19 0858    Education Details  Continuewith current POC at this time    Person(s) Educated  Patient    Methods  Explanation    Comprehension  Verbalized understanding       PT Short Term Goals - 04/07/19 1726      PT SHORT TERM GOAL #1   Title  Pt will demonstrate 120 degrees flexion/abd within 3 weeks in order to   demonstrate improvement of LUE functional ROM.    Baseline  98 degrees flexion, 77 degrees abd LUE    Period  Weeks    Status  New    Target Date  04/27/19        PT Long Term Goals - 04/07/19 1727      PT LONG TERM GOAL #1   Time  5    Period  Weeks    Status  On-going      PT LONG TERM GOAL #2   Title  Patient will increase left shoulder active flexion to >/= 140 degrees for increased ease reaching overhead.    Baseline  98 degrees flexion 04/06/19    Time  5    Period  Weeks    Status  On-going      PT LONG TERM GOAL #3   Title  Patient will increase left shoulder active abduction to >/= 150 degrees for increased ease reaching overhead.    Baseline  77 degrees 04/06/19    Time  5    Period  Weeks    Status  On-going      PT LONG TERM GOAL #4   Title  Patient will decrease DASH score to </= 5 for improved overall shoulder and upper extremity function.    Baseline  63% arm disability 04/06/19    Time  5    Period  Weeks    Status  On-going            Plan - 04/22/19 0804    Clinical Impression Statement  Pt presents to physical therapy with continued tightness/tenderness of the L shoulder that is improving. She was able to tolerate slight increase in resistance this session for scapular series that was not added to her HEP at this time but tolerated well w/o an increase in pain from baseline.  She continues to require occasional VC for AROM activities in standing to keep her L elbow extended but improved from last session. Palpable tightness/tenderness noted at the L deltoid has improved significantly continues at the insertion of the pectoralis major and the UT; decreased minimally following light STM followed by MLD. Possible fluid accumulation at the proximal L brachium discussed this with patient. PROM mobility continues to improve w/less pain precneded by joint mobilizations and oscillations. Continue with current POC.    Rehab Potential  Excellent    PT Frequency  2x / week    PT Duration  6 weeks    PT Treatment/Interventions  ADLs/Self Care Home Management;Therapeutic exercise;Patient/family education;Manual techniques;Manual lymph drainage;Therapeutic activities;Scar mobilization;Passive range of motion    PT Next Visit Plan  Assess STM/MLD and AAROM HEP, measure circumferential measurements, discuss sleeve    PT Home Exercise Plan  Access Code: [email or web address], Perform revised HEP for AAROM activities into standing.    Consulted and Agree with Plan of Care  Patient       Patient will benefit from skilled therapeutic intervention in order to improve the following deficits and impairments:  Decreased knowledge of precautions, Impaired UE functional use, Pain, Postural dysfunction, Decreased range of motion, Increased fascial restricitons  Visit Diagnosis: Abnormal posture  Aftercare following surgery for neoplasm  Stiffness of left shoulder, not elsewhere classified  Malignant neoplasm of overlapping sites of left breast in female, estrogen receptor positive ([facility])     Problem List Patient Active Problem List   Diagnosis Date Noted  . Breast cancer, left breast ([facility]) 02/18/2019  . Malignant neoplasm of overlapping sites of left   breast in female, estrogen receptor positive ([facility]) 12/17/2018  . Moderate obesity 03/13/2016  . Palpitations 03/06/2013  . Hyperlipidemia, mixed  03/06/2013  . Hypertension 02/14/2013  . Non-insulin treated type 2 diabetes mellitus ([facility]) 02/14/2013    Catherine H Healy, PT 04/22/2019, 8:59 AM  [facility] [address] [place], [place], [address] Phone: [phone]   Fax:  [phone]  Name: Hyun M Bredeson MRN: [id number] Date of Birth: 04/26/1961   

## 2019-04-25 ENCOUNTER — Other Ambulatory Visit: Payer: Self-pay

## 2019-04-25 ENCOUNTER — Ambulatory Visit
Admission: RE | Admit: 2019-04-25 | Discharge: 2019-04-25 | Disposition: A | Discharge status: Home / Self Care | Payer: BC Managed Care – PPO | Source: Ambulatory Visit | Attending: Radiation Oncology | Admitting: Radiation Oncology

## 2019-04-25 DIAGNOSIS — Z170 Estrogen receptor positive status [ER+]: Secondary | ICD-10-CM | POA: Diagnosis not present

## 2019-04-25 DIAGNOSIS — C50812 Malignant neoplasm of overlapping sites of left female breast: Secondary | ICD-10-CM | POA: Diagnosis not present

## 2019-04-25 DIAGNOSIS — Z510 Encounter for antineoplastic radiation therapy: Secondary | ICD-10-CM | POA: Diagnosis not present

## 2019-04-26 ENCOUNTER — Ambulatory Visit
Admission: RE | Admit: 2019-04-26 | Discharge: 2019-04-26 | Disposition: A | Discharge status: Home / Self Care | Payer: BC Managed Care – PPO | Source: Ambulatory Visit | Attending: Radiation Oncology | Admitting: Radiation Oncology

## 2019-04-26 ENCOUNTER — Other Ambulatory Visit: Payer: Self-pay

## 2019-04-26 ENCOUNTER — Ambulatory Visit: Payer: BC Managed Care – PPO

## 2019-04-26 DIAGNOSIS — Z510 Encounter for antineoplastic radiation therapy: Secondary | ICD-10-CM | POA: Diagnosis not present

## 2019-04-26 DIAGNOSIS — R293 Abnormal posture: Secondary | ICD-10-CM

## 2019-04-26 DIAGNOSIS — Z483 Aftercare following surgery for neoplasm: Secondary | ICD-10-CM

## 2019-04-26 DIAGNOSIS — M25612 Stiffness of left shoulder, not elsewhere classified: Secondary | ICD-10-CM

## 2019-04-26 DIAGNOSIS — Z170 Estrogen receptor positive status [ER+]: Secondary | ICD-10-CM

## 2019-04-26 DIAGNOSIS — C50812 Malignant neoplasm of overlapping sites of left female breast: Secondary | ICD-10-CM

## 2019-04-26 NOTE — Therapy (Signed)
[place], [place], [address] Phone: [phone]   Fax:  [phone]  Physical Therapy Treatment  Patient Details  Name: Tracy Knight MRN: [id number] Date of Birth: Oct 13, 1960 Referring Provider (PT): Dr. Alphonsa Overall   Encounter Date: 04/26/2019  PT End of Session - 04/26/19 0808    Visit Number  11    Number of Visits  20    Date for PT Re-Evaluation  05/11/19    PT Start Time  0803    PT Stop Time  0856    PT Time Calculation (min)  53 min    Activity Tolerance  Patient tolerated treatment well    Behavior During Therapy  [facility] for tasks assessed/performed       Past Medical History:  Diagnosis Date  . Asthma   . Cancer ([place]) 11/2018   left breast cancer  . Diabetes mellitus without complication ([place])   . Dyslipidemia   . History of nuclear stress test 02/2009   exercise; normal pattern of perfusion; no significant ischemia demonstrated; low risk scan   . Hypertension     Past Surgical History:  Procedure Laterality Date  . BREAST RECONSTRUCTION WITH PLACEMENT OF TISSUE EXPANDER AND ALLODERM Left 02/18/2019   Procedure: LEFT BREAST RECONSTRUCTION WITH PLACEMENT OF TISSUE EXPANDER AND ALLODERM;  Surgeon: Irene Limbo, MD;  Location: [place];  Service: Plastics;  Laterality: Left;  . CARDIAC CATHETERIZATION  07/2003   no evidence of CAD  . COLONOSCOPY N/A 02/17/2017   Procedure: COLONOSCOPY;  Surgeon: Daneil Dolin, MD;  [facility];  Service: Endoscopy;  Laterality: N/A;  10:00am  . MASTECTOMY W/ SENTINEL NODE BIOPSY Left 02/18/2019   Procedure: LEFT MASTECTOMY WITH LEFT AXILLARY SENTINEL LYMPH NODE BIOPSY;  Surgeon: Alphonsa Overall, MD;  Location: [place];  Service: General;  Laterality: Left;  . POLYPECTOMY  02/17/2017   Procedure: POLYPECTOMY;  Surgeon: Daneil Dolin, MD;  [facility];  Service: Endoscopy;;  colon  . removal  cyst on chin    . TRANSTHORACIC ECHOCARDIOGRAM  03/2007   EF=>55%; borderline LA enlargement; MV mildly thickened/borderline MVP/mild MR    There were no vitals filed for this visit.  Subjective Assessment - 04/26/19 0806    Subjective  Pt states that she has been doing her exercises at home and is feeling pretty good just stiff in the morning.    Pertinent History  Patient was diagnosed on 11/26/2018 with left grade II-III invasive lobular carcinoma breast cancer. It is ER/PR positive, HER2 negative, and a Ki67 of 10%. Patient underwent a left mastectomy and sentinel node biopsy on 02/18/2019. One of 5 lymph nodes was positive but her Mammaprint came back low risk so no chemo is needed. She will undergo axillary and chest radiation.    Patient Stated Goals  Get my arm moving better.    Currently in Pain?  Yes    Pain Score  1     Pain Location  Shoulder    Pain Orientation  Left    Pain Descriptors / Indicators  Tightness    Pain Type  Chronic pain    Pain Onset  More than a month ago    Aggravating Factors   Moving her arms first thing in the morning which gets better with movement.    Pain Relieving Factors  rest    Multiple Pain Sites  No  LYMPHEDEMA/ONCOLOGY QUESTIONNAIRE - 04/26/19 0808      Right Upper Extremity Lymphedema   15 cm Proximal to Olecranon Process  38.4 cm    10 cm Proximal to Olecranon Process  35.4 cm    Olecranon Process  28.2 cm    15 cm Proximal to Ulnar Styloid Process  28.1 cm    10 cm Proximal to Ulnar Styloid Process  24.7 cm    Just Proximal to Ulnar Styloid Process  17.5 cm    Across Hand at [employer]  20.8 cm    At [place] of 2nd Digit  6.8 cm      Left Upper Extremity Lymphedema   15 cm Proximal to Olecranon Process  39.7 cm    10 cm Proximal to Olecranon Process  35.9 cm    Olecranon Process  28.3 cm    15 cm Proximal to Ulnar Styloid Process  27.1 cm    10 cm Proximal to Ulnar Styloid Process  24.7 cm    Just Proximal to  Ulnar Styloid Process  17.5 cm    Across Hand at [employer]  20.3 cm    At [place] of 2nd Digit  6.8 cm                [facility] Adult PT Treatment/Exercise - 04/26/19 0001      Shoulder Exercises: Supine   Protraction  Strengthening;Both;10 reps    Protraction Weight (lbs)  1    Protraction Limitations  VC and tactile cueing for correct movement    Horizontal ABduction  Strengthening;Both;10 reps    Horizontal ABduction Weight (lbs)  1    Horizontal ABduction Limitations  VC to avoid pain    External Rotation  --    External Rotation Weight (lbs)  --    Flexion  Strengthening;Both;10 reps    Shoulder Flexion Weight (lbs)  1    Flexion Limitations  VC for movement w/demonstration and educated to avoid painful ROM    Diagonals  Strengthening;Both;10 reps    Diagonals Weight (lbs)  1    Diagonals Limitations  VC and tactile cueing for movement as well as to avoid pain.     Other Supine Exercises  Chest press 10x w/1# wgt demonstration for movement no difficulty to begin light free weights.     Other Supine Exercises  ER with hands under head for 30 s 3x with VC to avoid pain and hold position for full length of time.       Shoulder Exercises: Sidelying   External Rotation  Strengthening;10 reps;Left    External Rotation Weight (lbs)  1    External Rotation Limitations  tactile cueing for correct movement and arm placement, VC to avoid pain. pt reports only slight muscle tension throughout      Manual Therapy   Manual Therapy  Soft tissue mobilization;Passive ROM;Manual Lymphatic Drainage (MLD);Joint mobilization    Joint Mobilization  Grade III inferior and posteroir 8x 8 sets at the Glenohumeral joint to improve capsular mobility to decrease stress on the shoulder and improve fluid exchange.     Soft tissue mobilization  L deltoid/pectoralis major tightness is improving but continues; continues to slightly decrease following light STM.     Manual Lymphatic Drainage (MLD)  B  axillary and inguinal node stimulation, inter axillary anastomosis to the R and axillo-inguinal anastomosis on the L     Passive ROM  PROM in all directions with visual on L incision site over reconstruction  to monitor for tension; no tension noted throughout incision was in tact at end of session.              PT Education - 04/26/19 [phone]    Education Details  Continue with current POC at this time. Discussed prophylactic sleeve and when to wear it including at work or if she is going to work out or fly.    Person(s) Educated  Patient    Comprehension  Verbalized understanding       PT Short Term Goals - 04/07/19 1726      PT SHORT TERM GOAL #1   Title  Pt will demonstrate 120 degrees flexion/abd within 3 weeks in order to demonstrate improvement of LUE functional ROM.    Baseline  98 degrees flexion, 77 degrees abd LUE    Period  Weeks    Status  New    Target Date  04/27/19        PT Long Term Goals - 04/07/19 1727      PT LONG TERM GOAL #1   Time  5    Period  Weeks    Status  On-going      PT LONG TERM GOAL #2   Title  Patient will increase left shoulder active flexion to >/= 140 degrees for increased ease reaching overhead.    Baseline  98 degrees flexion 04/06/19    Time  5    Period  Weeks    Status  On-going      PT LONG TERM GOAL #3   Title  Patient will increase left shoulder active abduction to >/= 150 degrees for increased ease reaching overhead.    Baseline  77 degrees 04/06/19    Time  5    Period  Weeks    Status  On-going      PT LONG TERM GOAL #4   Title  Patient will decrease DASH score to </= 5 for improved overall shoulder and upper extremity function.    Baseline  63% arm disability 04/06/19    Time  5    Period  Weeks    Status  On-going            Plan - 04/26/19 0807    Clinical Impression Statement  Measurements were taken this session without significant difference in circumference between arms. Light free weights were used  this session for more functional strengthening with good results starting with chest press w/o an increase in pain and progressing through scapular series pain-free. Pt continues with stiffness noted in the L glenohumeral joint that improves with grade III mobs and stiffness/tightness noted at the L pectoralis major at insertion and deltoid; decreased following light STM. MLD is performed after all manual activities to decrease risk for lymphedema. Discussed prophylactic sleeve with patient this session. Continue with current POC.    Stability/Clinical Decision Making  Stable/Uncomplicated    Rehab Potential  Excellent    PT Frequency  2x / week    PT Duration  6 weeks    PT Treatment/Interventions  ADLs/Self Care Home Management;Therapeutic exercise;Patient/family education;Manual techniques;Manual lymph drainage;Therapeutic activities;Scar mobilization;Passive range of motion    PT Next Visit Plan  Check on script for sleeve, update HEP    PT Home Exercise Plan  Access Code: [email or web address], Perform revised HEP for AAROM activities into standing.    Consulted and Agree with Plan of Care  Patient       Patient will benefit from skilled therapeutic intervention in  order to improve the following deficits and impairments:  Decreased knowledge of precautions, Impaired UE functional use, Pain, Postural dysfunction, Decreased range of motion, Increased fascial restricitons  Visit Diagnosis: Abnormal posture  Aftercare following surgery for neoplasm  Stiffness of left shoulder, not elsewhere classified  Malignant neoplasm of overlapping sites of left breast in female, estrogen receptor positive [facility])     Problem List Patient Active Problem List   Diagnosis Date Noted  . Breast cancer, left breast ([place]) 02/18/2019  . Malignant neoplasm of overlapping sites of left breast in female, estrogen receptor positive ([place]) 12/17/2018  . Moderate obesity 03/13/2016  . Palpitations 03/06/2013  .  Hyperlipidemia, mixed 03/06/2013  . Hypertension 02/14/2013  . Non-insulin treated type 2 diabetes mellitus ([place]) 02/14/2013    Ander Purpura, PT 04/26/2019, 8:58 AM  [place], [place], [address] Phone: [phone]   Fax:  [phone]  Name: COREEN SHIPPEE MRN: [id number] Date of Birth: Jun 06, 1961

## 2019-04-27 ENCOUNTER — Ambulatory Visit
Admission: RE | Admit: 2019-04-27 | Discharge: 2019-04-27 | Disposition: A | Discharge status: Home / Self Care | Payer: BC Managed Care – PPO | Source: Ambulatory Visit | Attending: Radiation Oncology | Admitting: Radiation Oncology

## 2019-04-27 ENCOUNTER — Other Ambulatory Visit: Payer: Self-pay

## 2019-04-27 DIAGNOSIS — Z510 Encounter for antineoplastic radiation therapy: Secondary | ICD-10-CM | POA: Diagnosis not present

## 2019-04-27 DIAGNOSIS — C50812 Malignant neoplasm of overlapping sites of left female breast: Secondary | ICD-10-CM | POA: Diagnosis not present

## 2019-04-27 DIAGNOSIS — Z170 Estrogen receptor positive status [ER+]: Secondary | ICD-10-CM | POA: Diagnosis not present

## 2019-04-28 ENCOUNTER — Ambulatory Visit
Admission: RE | Admit: 2019-04-28 | Discharge: 2019-04-28 | Disposition: A | Discharge status: Home / Self Care | Payer: BC Managed Care – PPO | Source: Ambulatory Visit | Attending: Radiation Oncology | Admitting: Radiation Oncology

## 2019-04-28 ENCOUNTER — Other Ambulatory Visit: Payer: Self-pay

## 2019-04-28 DIAGNOSIS — Z170 Estrogen receptor positive status [ER+]: Secondary | ICD-10-CM | POA: Diagnosis not present

## 2019-04-28 DIAGNOSIS — C50812 Malignant neoplasm of overlapping sites of left female breast: Secondary | ICD-10-CM | POA: Diagnosis not present

## 2019-04-28 DIAGNOSIS — Z510 Encounter for antineoplastic radiation therapy: Secondary | ICD-10-CM | POA: Diagnosis not present

## 2019-04-29 ENCOUNTER — Other Ambulatory Visit: Payer: Self-pay

## 2019-04-29 ENCOUNTER — Ambulatory Visit: Payer: BC Managed Care – PPO

## 2019-04-29 ENCOUNTER — Ambulatory Visit
Admission: RE | Admit: 2019-04-29 | Discharge: 2019-04-29 | Disposition: A | Discharge status: Home / Self Care | Payer: BC Managed Care – PPO | Source: Ambulatory Visit | Attending: Radiation Oncology | Admitting: Radiation Oncology

## 2019-04-29 DIAGNOSIS — Z170 Estrogen receptor positive status [ER+]: Secondary | ICD-10-CM | POA: Diagnosis not present

## 2019-04-29 DIAGNOSIS — R293 Abnormal posture: Secondary | ICD-10-CM | POA: Diagnosis not present

## 2019-04-29 DIAGNOSIS — M25612 Stiffness of left shoulder, not elsewhere classified: Secondary | ICD-10-CM

## 2019-04-29 DIAGNOSIS — C50812 Malignant neoplasm of overlapping sites of left female breast: Secondary | ICD-10-CM

## 2019-04-29 DIAGNOSIS — Z483 Aftercare following surgery for neoplasm: Secondary | ICD-10-CM | POA: Diagnosis not present

## 2019-04-29 DIAGNOSIS — Z510 Encounter for antineoplastic radiation therapy: Secondary | ICD-10-CM | POA: Diagnosis not present

## 2019-04-29 NOTE — Therapy (Signed)
[place], [place], [address] Phone: [phone]   Fax:  [phone]  Physical Therapy Treatment  Patient Details  Name: Tracy Knight MRN: [id number] Date of Birth: 12/13/1960 Referring Provider (PT): Dr. Alphonsa Overall   Encounter Date: 04/29/2019  PT End of Session - 04/29/19 0803    Visit Number  12    Number of Visits  20    Date for PT Re-Evaluation  05/11/19    PT Start Time  0802    PT Stop Time  0858    PT Time Calculation (min)  56 min    Activity Tolerance  Patient tolerated treatment well    Behavior During Therapy  [facility] for tasks assessed/performed       Past Medical History:  Diagnosis Date  . Asthma   . Cancer ([place]) 11/2018   left breast cancer  . Diabetes mellitus without complication ([place])   . Dyslipidemia   . History of nuclear stress test 02/2009   exercise; normal pattern of perfusion; no significant ischemia demonstrated; low risk scan   . Hypertension     Past Surgical History:  Procedure Laterality Date  . BREAST RECONSTRUCTION WITH PLACEMENT OF TISSUE EXPANDER AND ALLODERM Left 02/18/2019   Procedure: LEFT BREAST RECONSTRUCTION WITH PLACEMENT OF TISSUE EXPANDER AND ALLODERM;  Surgeon: Irene Limbo, MD;  Location: [facility];  Service: Plastics;  Laterality: Left;  . CARDIAC CATHETERIZATION  07/2003   no evidence of CAD  . COLONOSCOPY N/A 02/17/2017   Procedure: COLONOSCOPY;  Surgeon: Daneil Dolin, MD;  [facility];  Service: Endoscopy;  Laterality: N/A;  10:00am  . MASTECTOMY W/ SENTINEL NODE BIOPSY Left 02/18/2019   Procedure: LEFT MASTECTOMY WITH LEFT AXILLARY SENTINEL LYMPH NODE BIOPSY;  Surgeon: Alphonsa Overall, MD;  Location: [facility] [place];  Service: General;  Laterality: Left;  . POLYPECTOMY  02/17/2017   Procedure: POLYPECTOMY;  Surgeon: Daneil Dolin, MD;  [facility];  Service: Endoscopy;;  colon  . removal  cyst on chin    . TRANSTHORACIC ECHOCARDIOGRAM  03/2007   EF=>55%; borderline LA enlargement; MV mildly thickened/borderline MVP/mild MR    There were no vitals filed for this visit.  Subjective Assessment - 04/29/19 0803    Subjective  Pt reports that she continues to perform her exercises at home and is feeling much better over all.    Pertinent History  Patient was diagnosed on 11/26/2018 with left grade II-III invasive lobular carcinoma breast cancer. It is ER/PR positive, HER2 negative, and a Ki67 of 10%. Patient underwent a left mastectomy and sentinel node biopsy on 02/18/2019. One of 5 lymph nodes was positive but her Mammaprint came back low risk so no chemo is needed. She will undergo axillary and chest radiation.    Patient Stated Goals  Get my arm moving better.    Currently in Pain?  Yes    Pain Score  1     Pain Location  Shoulder    Pain Orientation  Left    Pain Descriptors / Indicators  Tightness    Pain Type  Chronic pain    Pain Onset  More than a month ago    Pain Frequency  Intermittent    Aggravating Factors   Reports continued stiffness in the shoulder in the AM.    Pain Relieving Factors  movement    Multiple Pain Sites  No  [place] Adult PT Treatment/Exercise - 04/29/19 0001      Shoulder Exercises: Supine   Protraction  Both;10 reps;AROM    Protraction Limitations  Pt was able to perform w/o VC this session independently    Horizontal ABduction  Strengthening;Both;10 reps    Horizontal ABduction Weight (lbs)  1    Horizontal ABduction Limitations  VC to avoid pain    Flexion  Strengthening;Both;10 reps    Shoulder Flexion Weight (lbs)  1    Flexion Limitations  VC to avoid pain and demonstration for movement.     Diagonals  Strengthening;Both;10 reps    Diagonals Weight (lbs)  1    Diagonals Limitations  VC cueing and demonstration for movement including not locking out the elbow as well as to avoid pain.     Other  Supine Exercises  Chest press 10x w/1# wgt demonstration for movement no difficulty to begin light free weights.     Other Supine Exercises  ER with hands under head for 30 s 3x with VC to avoid pain and hold position for full length of time.       Shoulder Exercises: Seated   Other Seated Exercises  Seated trunk rotation to the L w/end range scapular stretch to improve ROM at the L shoulder 5x Bil      Shoulder Exercises: Sidelying   External Rotation  Strengthening;10 reps;Left;Right    External Rotation Weight (lbs)  1    External Rotation Limitations  tactile cueing for correct movement and arm placement, VC to avoid pain. Tactile cueing at the trunk to prevent compensation with trunk posterior rotation.     Internal Rotation  Strengthening;Right;Left;10 reps    Internal Rotation Weight (lbs)  1    Internal Rotation Limitations  tactile cueing for correct positoning off of the shoulder in side lying for both sides and tactile cueing for movement including at the wrist to prevent compensation with wrist extension on the L.       Shoulder Exercises: Standing   Flexion  Strengthening;Both;10 reps    Shoulder Flexion Weight (lbs)  1    Flexion Limitations  VC and demonstration for movement. VC for L elbow extension with good improvement following cueing. VC to avoid pain    ABduction  Strengthening;Both;10 reps    Shoulder ABduction Weight (lbs)  1    ABduction Limitations  VC, demonstration and tactile cueing for correct movement especially for thumb direction pt tends to significantly over ER the L arm during abduction heavy tactile cueing for first 6 reps then pt was able to perform with significant improvement in form for the last 4 improving ROM and form. VC to avoid pain.       Manual Therapy   Manual Therapy  Soft tissue mobilization;Passive ROM;Manual Lymphatic Drainage (MLD);Joint mobilization    Joint Mobilization  Grade III inferior and posteroir 8x 8 sets at the Glenohumeral joint  to improve capsular mobility to decrease stress on the shoulder and improve fluid exchange.     Soft tissue mobilization  L deltoid/pectoralis major tightness is improving but continues; continues to slightly decrease following light STM.     Manual Lymphatic Drainage (MLD)  B axillary and inguinal node stimulation, inter axillary anastomosis to the R and axillo-inguinal anastomosis on the L     Passive ROM  PROM in all directions with visual on L incision site over reconstruction to monitor for tension; no tension noted throughout incision was in tact at end of session.  PT Education - 04/29/19 1191    Education Details  Continue with current HEP. Discussed continueing 2x/week next week then dropping to 1x/week once she starts back at work for a couple of weeks.    Person(s) Educated  Patient    Methods  Explanation    Comprehension  Verbalized understanding       PT Short Term Goals - 04/07/19 1726      PT SHORT TERM GOAL #1   Title  Pt will demonstrate 120 degrees flexion/abd within 3 weeks in order to demonstrate improvement of LUE functional ROM.    Baseline  98 degrees flexion, 77 degrees abd LUE    Period  Weeks    Status  New    Target Date  04/27/19        PT Long Term Goals - 04/07/19 1727      PT LONG TERM GOAL #1   Time  5    Period  Weeks    Status  On-going      PT LONG TERM GOAL #2   Title  Patient will increase left shoulder active flexion to >/= 140 degrees for increased ease reaching overhead.    Baseline  98 degrees flexion 04/06/19    Time  5    Period  Weeks    Status  On-going      PT LONG TERM GOAL #3   Title  Patient will increase left shoulder active abduction to >/= 150 degrees for increased ease reaching overhead.    Baseline  77 degrees 04/06/19    Time  5    Period  Weeks    Status  On-going      PT LONG TERM GOAL #4   Title  Patient will decrease DASH score to </= 5 for improved overall shoulder and upper extremity  function.    Baseline  63% arm disability 04/06/19    Time  5    Period  Weeks    Status  On-going            Plan - 04/29/19 0802    Clinical Impression Statement  Pt continues with improvement. She was able to tolerate resisted ther-ex into flexion/abduction gravity resisted this session w/o an increase in pain and improving significantly with verbal and tactile cueing. Stiffness has significantly decreased in the L glenohumeral joint but tends to move better following joint mobilizations due to decreased stiffness related to fluid exchange and lubrication. Palpable tightness/tenderness continues at the L deltoid, pec major insertion and upper trapezius that decreases following light STM. MLD performed following STM in order to decrease risk for lymphedema in the LUE. Discussed change in POC for 2x/week next week then dropping to 1x/week due to significant progress and pt returning to work.    Stability/Clinical Decision Making  Stable/Uncomplicated    PT Frequency  2x / week    PT Duration  6 weeks    PT Treatment/Interventions  ADLs/Self Care Home Management;Therapeutic exercise;Patient/family education;Manual techniques;Manual lymph drainage;Therapeutic activities;Scar mobilization;Passive range of motion    PT Home Exercise Plan  Access Code: [email or web address], Perform revised HEP for AAROM activities into standing.    Consulted and Agree with Plan of Care  Patient       Patient will benefit from skilled therapeutic intervention in order to improve the following deficits and impairments:  Decreased knowledge of precautions, Impaired UE functional use, Pain, Postural dysfunction, Decreased range of motion, Increased fascial restricitons  Visit Diagnosis: Abnormal posture  Aftercare following  surgery for neoplasm  Stiffness of left shoulder, not elsewhere classified  Malignant neoplasm of overlapping sites of left breast in female, estrogen receptor positive [facility])     Problem  List Patient Active Problem List   Diagnosis Date Noted  . Breast cancer, left breast ([place]) 02/18/2019  . Malignant neoplasm of overlapping sites of left breast in female, estrogen receptor positive ([place]) 12/17/2018  . Moderate obesity 03/13/2016  . Palpitations 03/06/2013  . Hyperlipidemia, mixed 03/06/2013  . Hypertension 02/14/2013  . Non-insulin treated type 2 diabetes mellitus ([place]) 02/14/2013    Ander Purpura, PT 04/29/2019, 9:06 AM  [place], [place], [address] Phone: [phone]   Fax:  [phone]  Name: APRILLE SAWHNEY MRN: [id number] Date of Birth: 08-20-1960

## 2019-05-02 ENCOUNTER — Ambulatory Visit
Admission: RE | Admit: 2019-05-02 | Discharge: 2019-05-02 | Disposition: A | Discharge status: Home / Self Care | Payer: BC Managed Care – PPO | Source: Ambulatory Visit | Attending: Radiation Oncology | Admitting: Radiation Oncology

## 2019-05-02 ENCOUNTER — Other Ambulatory Visit: Payer: Self-pay

## 2019-05-02 ENCOUNTER — Ambulatory Visit: Payer: BC Managed Care – PPO | Attending: Surgery

## 2019-05-02 DIAGNOSIS — Z483 Aftercare following surgery for neoplasm: Secondary | ICD-10-CM | POA: Insufficient documentation

## 2019-05-02 DIAGNOSIS — Z170 Estrogen receptor positive status [ER+]: Secondary | ICD-10-CM | POA: Diagnosis not present

## 2019-05-02 DIAGNOSIS — M25612 Stiffness of left shoulder, not elsewhere classified: Secondary | ICD-10-CM | POA: Diagnosis not present

## 2019-05-02 DIAGNOSIS — C50412 Malignant neoplasm of upper-outer quadrant of left female breast: Secondary | ICD-10-CM | POA: Insufficient documentation

## 2019-05-02 DIAGNOSIS — R293 Abnormal posture: Secondary | ICD-10-CM | POA: Insufficient documentation

## 2019-05-02 DIAGNOSIS — C50812 Malignant neoplasm of overlapping sites of left female breast: Secondary | ICD-10-CM

## 2019-05-02 NOTE — Patient Instructions (Signed)
Access Code: [email or web address]  URL: [email or web address]  Date: 05/02/2019  Prepared by: Tomma Rakers   Exercises Supine Chest Stretch with Elbows Bent - 3 reps - 1 sets - 30 seconds hold - 1x daily - 7x weekly Shoulder External Rotation with Anchored Resistance - 10 reps - 1 sets - 1x daily - 7x weekly Shoulder Flexion Wall Slide with Towel - 10 reps - 1 sets - 1x daily                            - 7x weekly Single Arm Shoulder Extension with Resistance - 10 reps - 1 sets - 1x daily - 7x weekly Standing Shoulder Abduction Slides at Wall - 10 reps - 1 sets - 1x daily - 7x weekly Standing Shoulder Internal Rotation with Anchored Resistance - 10 reps - 1 sets - 1x daily - 7x weekly Standing Single Arm Shoulder Flexion with Posterior Anchored Resistance - 10 reps - 1 sets - 1x daily - 7x weekly

## 2019-05-02 NOTE — Therapy (Signed)
[place], [place], [address] Phone: [phone]   Fax:  [phone]  Physical Therapy Treatment  Patient Details  Name: Tracy Knight MRN: [id number] Date of Birth: 1961-05-17 Referring Provider (PT): Dr. Alphonsa Overall   Encounter Date: 05/02/2019  PT End of Session - 05/02/19 0805    Visit Number  13    Number of Visits  20    Date for PT Re-Evaluation  05/11/19    PT Start Time  0803    PT Stop Time  0902    PT Time Calculation (min)  59 min    Activity Tolerance  Patient tolerated treatment well    Behavior During Therapy  [facility] for tasks assessed/performed       Past Medical History:  Diagnosis Date  . Asthma   . Cancer ([place]) 11/2018   left breast cancer  . Diabetes mellitus without complication ([place])   . Dyslipidemia   . History of nuclear stress test 02/2009   exercise; normal pattern of perfusion; no significant ischemia demonstrated; low risk scan   . Hypertension     Past Surgical History:  Procedure Laterality Date  . BREAST RECONSTRUCTION WITH PLACEMENT OF TISSUE EXPANDER AND ALLODERM Left 02/18/2019   Procedure: LEFT BREAST RECONSTRUCTION WITH PLACEMENT OF TISSUE EXPANDER AND ALLODERM;  Surgeon: Irene Limbo, MD;  Location: [place];  Service: Plastics;  Laterality: Left;  . CARDIAC CATHETERIZATION  07/2003   no evidence of CAD  . COLONOSCOPY N/A 02/17/2017   Procedure: COLONOSCOPY;  Surgeon: Daneil Dolin, MD;  [facility];  Service: Endoscopy;  Laterality: N/A;  10:00am  . MASTECTOMY W/ SENTINEL NODE BIOPSY Left 02/18/2019   Procedure: LEFT MASTECTOMY WITH LEFT AXILLARY SENTINEL LYMPH NODE BIOPSY;  Surgeon: Alphonsa Overall, MD;  Location: [place];  Service: General;  Laterality: Left;  . POLYPECTOMY  02/17/2017   Procedure: POLYPECTOMY;  Surgeon: Daneil Dolin, MD;  [facility];  Service: Endoscopy;;  colon  . removal  cyst on chin    . TRANSTHORACIC ECHOCARDIOGRAM  03/2007   EF=>55%; borderline LA enlargement; MV mildly thickened/borderline MVP/mild MR    There were no vitals filed for this visit.  Subjective Assessment - 05/02/19 0804    Subjective  Pt reports that everything has been going well.    Pertinent History  Patient was diagnosed on 11/26/2018 with left grade II-III invasive lobular carcinoma breast cancer. It is ER/PR positive, HER2 negative, and a Ki67 of 10%. Patient underwent a left mastectomy and sentinel node biopsy on 02/18/2019. One of 5 lymph nodes was positive but her Mammaprint came back low risk so no chemo is needed. She will undergo axillary and chest radiation.    Patient Stated Goals  Get my arm moving better.    Currently in Pain?  No/denies    Pain Score  0-No pain    Multiple Pain Sites  No                       [facility] Adult PT Treatment/Exercise - 05/02/19 0001      Shoulder Exercises: Supine   Horizontal ABduction  Strengthening;Both;10 reps    Horizontal ABduction Weight (lbs)  1    Horizontal ABduction Limitations  2 sets, VC to straighten the L wrist    Flexion  Strengthening;Both;10 reps    Shoulder Flexion Weight (lbs)  1    Flexion Limitations  2  sets, Demonstration for correct movement and VC to avoid pain. Pt was able to hold her L elbow extended throughout without VC to achieve correct movement.     Diagonals  Strengthening;Both;10 reps    Diagonals Weight (lbs)  1    Diagonals Limitations  2 sets, D1/D2 2x 10 each with 1 lb wgt, Tactile cueing for correct movement for D2 and demonstration, tactlie cueing and VC for D1. VC occasionally for greater flexion with D2.       Shoulder Exercises: Sidelying   External Rotation  Strengthening;10 reps;Left;Right    External Rotation Weight (lbs)  2    External Rotation Limitations  tactile cueing with towel and hands for movement form for correct movement and arm placement, VC to avoid pain. Tactile  cueing at the trunk to prevent compensation with trunk posterior rotation.     ABduction  Strengthening;Right;Left;10 reps    ABduction Weight (lbs)  1    ABduction Limitations  Demonstration for movement and VC for correct direction of thumb in order to prevent impingement. VC to avoid pain.       Shoulder Exercises: Standing   External Rotation  Left;10 reps;Strengthening    Theraband Level (Shoulder External Rotation)  Level 1 (Yellow)    External Rotation Limitations  VC required to prevent rotation of the trunk and tactile cueing using cloth at elbow to prevent compensation    Internal Rotation  Left;Strengthening;10 reps    Theraband Level (Shoulder Internal Rotation)  Level 1 (Yellow)    Internal Rotation Limitations  VC required to prevent rotation of the trunk, Tactile cueing at the wrist to prevent extension and cloth at elbow to prevent adduction of the shoulder.     Flexion  Strengthening;10 reps;Left    Theraband Level (Shoulder Flexion)  Level 1 (Yellow)    Flexion Limitations  VC for elbow straight    Extension  Left    Theraband Level (Shoulder Extension)  Level 1 (Yellow)    Extension Limitations  VC for scapular squeeze throughout extension    Other Standing Exercises  AROM flexion/abd at the wall 10x w/5 secondhold and end range stretch 10x each Bil VC to avoid pain and demonstration of preventing side bending.       Manual Therapy   Manual Therapy  Passive ROM;Manual Lymphatic Drainage (MLD);Joint mobilization    Joint Mobilization  Grade III inferior and posteroir 8x 8 sets at the Glenohumeral joint to improve capsular mobility to decrease stress on the shoulder and improve fluid exchange.     Soft tissue mobilization  L deltoid and upper trapezius; decreased minimally following light STM    Manual Lymphatic Drainage (MLD)  B axillary and inguinal node stimulation, inter axillary anastomosis to the R and axillo-inguinal anastomosis on the L     Passive ROM  PROM in all  directions with visual on L incision site over reconstruction to monitor for tension; no tension noted throughout incision was in tact at end of session.              PT Education - 05/02/19 0825    Education Details  Access Code: [email or web address], Pt will begin standing resistive shoulder exercises this week using yellow band at home. Educated to focus on this and she can occasionally add in some of the older exercises as she feels comfortable. Pain-free.    Person(s) Educated  Patient    Methods  Explanation;Demonstration;Tactile cues;Verbal cues;Handout    Comprehension  Verbalized understanding;Returned demonstration  PT Short Term Goals - 04/07/19 1726      PT SHORT TERM GOAL #1   Title  Pt will demonstrate 120 degrees flexion/abd within 3 weeks in order to demonstrate improvement of LUE functional ROM.    Baseline  98 degrees flexion, 77 degrees abd LUE    Period  Weeks    Status  New    Target Date  04/27/19        PT Long Term Goals - 04/07/19 1727      PT LONG TERM GOAL #1   Time  5    Period  Weeks    Status  On-going      PT LONG TERM GOAL #2   Title  Patient will increase left shoulder active flexion to >/= 140 degrees for increased ease reaching overhead.    Baseline  98 degrees flexion 04/06/19    Time  5    Period  Weeks    Status  On-going      PT LONG TERM GOAL #3   Title  Patient will increase left shoulder active abduction to >/= 150 degrees for increased ease reaching overhead.    Baseline  77 degrees 04/06/19    Time  5    Period  Weeks    Status  On-going      PT LONG TERM GOAL #4   Title  Patient will decrease DASH score to </= 5 for improved overall shoulder and upper extremity function.    Baseline  63% arm disability 04/06/19    Time  5    Period  Weeks    Status  On-going            Plan - 05/02/19 0804    Clinical Impression Statement  Pt presents with significant decrease in L pectoralis major tightness. Pt was able to tolerate  an increase in difficulty with ther-ex this session in gravity resisted. Possible edema noted in the L brachium; discussed this with patient and will measure next session teaching MLD if needed. Pt is still waiting on script for compression garment. Palpable tightness/tenderness noted at the L deltoid and Upper trapezius; decreasd following light STM. MLD was perfomred following STM to decrease risk for fluid build up and to promote lymphatic flow. Stiffness continues at the L glenohumeral joint Grade III mobs prior to PROM for greater end range stretch. Pt will benefit from continued POC.    Rehab Potential  Excellent    PT Frequency  2x / week    PT Duration  6 weeks    PT Treatment/Interventions  ADLs/Self Care Home Management;Therapeutic exercise;Patient/family education;Manual techniques;Manual lymph drainage;Therapeutic activities;Scar mobilization;Passive range of motion    PT Next Visit Plan  Check on script for sleeve, start to go over strenght after breast cancer exercises over the next few weeks. re-measure next session and start to teach MLD if needed    PT Home Exercise Plan  Access Code: [email or web address], perform [place] exercises at home.    Consulted and Agree with Plan of Care  Patient       Patient will benefit from skilled therapeutic intervention in order to improve the following deficits and impairments:  Decreased knowledge of precautions, Impaired UE functional use, Pain, Postural dysfunction, Decreased range of motion, Increased fascial restricitons  Visit Diagnosis: Abnormal posture  Aftercare following surgery for neoplasm  Stiffness of left shoulder, not elsewhere classified  Malignant neoplasm of overlapping sites of left breast in female, estrogen receptor positive ([place])  Problem List Patient Active Problem List   Diagnosis Date Noted  . Breast cancer, left breast ([place]) 02/18/2019  . Malignant neoplasm of overlapping sites of left breast in female, estrogen  receptor positive ([place]) 12/17/2018  . Moderate obesity 03/13/2016  . Palpitations 03/06/2013  . Hyperlipidemia, mixed 03/06/2013  . Hypertension 02/14/2013  . Non-insulin treated type 2 diabetes mellitus ([place]) 02/14/2013    Ander Purpura, PT 05/02/2019, 9:04 AM  [place], [place], [address] Phone: [phone]   Fax:  [phone]  Name: Tracy Knight MRN: [id number] Date of Birth: 05/18/1961

## 2019-05-03 ENCOUNTER — Other Ambulatory Visit: Payer: Self-pay

## 2019-05-03 ENCOUNTER — Ambulatory Visit
Admission: RE | Admit: 2019-05-03 | Discharge: 2019-05-03 | Disposition: A | Discharge status: Home / Self Care | Payer: BC Managed Care – PPO | Source: Ambulatory Visit | Attending: Radiation Oncology | Admitting: Radiation Oncology

## 2019-05-03 DIAGNOSIS — C50412 Malignant neoplasm of upper-outer quadrant of left female breast: Secondary | ICD-10-CM | POA: Diagnosis not present

## 2019-05-03 DIAGNOSIS — Z170 Estrogen receptor positive status [ER+]: Secondary | ICD-10-CM | POA: Diagnosis not present

## 2019-05-03 DIAGNOSIS — C50812 Malignant neoplasm of overlapping sites of left female breast: Secondary | ICD-10-CM | POA: Diagnosis not present

## 2019-05-04 ENCOUNTER — Ambulatory Visit: Payer: BC Managed Care – PPO

## 2019-05-04 ENCOUNTER — Other Ambulatory Visit: Payer: Self-pay

## 2019-05-04 ENCOUNTER — Ambulatory Visit
Admission: RE | Admit: 2019-05-04 | Discharge: 2019-05-04 | Disposition: A | Discharge status: Home / Self Care | Payer: BC Managed Care – PPO | Source: Ambulatory Visit | Attending: Radiation Oncology | Admitting: Radiation Oncology

## 2019-05-04 DIAGNOSIS — R293 Abnormal posture: Secondary | ICD-10-CM

## 2019-05-04 DIAGNOSIS — C50812 Malignant neoplasm of overlapping sites of left female breast: Secondary | ICD-10-CM

## 2019-05-04 DIAGNOSIS — M25612 Stiffness of left shoulder, not elsewhere classified: Secondary | ICD-10-CM | POA: Diagnosis not present

## 2019-05-04 DIAGNOSIS — Z483 Aftercare following surgery for neoplasm: Secondary | ICD-10-CM

## 2019-05-04 DIAGNOSIS — Z170 Estrogen receptor positive status [ER+]: Secondary | ICD-10-CM

## 2019-05-04 DIAGNOSIS — C50412 Malignant neoplasm of upper-outer quadrant of left female breast: Secondary | ICD-10-CM | POA: Diagnosis not present

## 2019-05-04 NOTE — Patient Instructions (Addendum)
Deep Effective Breath   Standing, sitting, or laying down, place both hands on the belly. Take a deep breath IN, expanding the belly; then breath OUT, contracting the belly. Repeat __5__ times. Do __2-3__ sessions per day and before your self massage.  http://gt2.exer.us/866   Copyright  VHI. All rights reserved.  Axilla to Axilla - Sweep   On both sides make 5 circles in the armpit, then pump _5__ times from involved armpit across chest to uninvolved armpit, making a pathway. Do _1__ time per day.  Copyright  VHI. All rights reserved.  Axilla to Inguinal Nodes - Sweep   On both sides, make 5 circles at groin at panty line, then pump _5__ times from armpit along side of trunk to outer hip, making your other pathway. Do __1_ time per day.  Copyright  VHI. All rights reserved.  Arm Posterior: Elbow to Shoulder - Sweep   Pump _5__ times from back of elbow to top of shoulder. Then inner to outer upper arm _5_ times, then outer arm again _5_ times. Then back to the pathways _2-3_ times. Do _1__ time per day.  Copyright  VHI. All rights reserved.  ARM: [place] Wrist to Elbow - Sweep   Pump or stationary circles _5__ times from wrist to elbow making sure to do both sides of the forearm. Then retrace your steps to the outer arm, and the pathways _2-3_ times each. Do _1__ time per day.  Copyright  VHI. All rights reserved.  ARM: Dorsum of Hand to Shoulder - Sweep   Pump or stationary circles _5__ times on back of hand including knuckle spaces and individual fingers if needed working up towards the wrist, then retrace all your steps working back up the forearm, doing both sides; upper outer arm and back to your pathways _2-3_ times each. Then do 5 circles again at uninvolved armpit and involved groin where you started! Good job!! Do __1_ time per day.  Copyright  VHI. All rights reserved.

## 2019-05-04 NOTE — Therapy (Signed)
[place], [place], [address] Phone: [phone]   Fax:  [phone]  Physical Therapy Treatment  Patient Details  Name: Tracy Knight MRN: [id number] Date of Birth: 07/23/1960 Referring Provider (PT): Dr. Alphonsa Overall   Encounter Date: 05/04/2019  PT End of Session - 05/04/19 0808    Visit Number  14    Number of Visits  20    Date for PT Re-Evaluation  05/11/19    PT Start Time  0804    PT Stop Time  3762    PT Time Calculation (min)  53 min    Activity Tolerance  Patient tolerated treatment well    Behavior During Therapy  [facility] for tasks assessed/performed       Past Medical History:  Diagnosis Date  . Asthma   . Cancer ([place]) 11/2018   left breast cancer  . Diabetes mellitus without complication ([place])   . Dyslipidemia   . History of nuclear stress test 02/2009   exercise; normal pattern of perfusion; no significant ischemia demonstrated; low risk scan   . Hypertension     Past Surgical History:  Procedure Laterality Date  . BREAST RECONSTRUCTION WITH PLACEMENT OF TISSUE EXPANDER AND ALLODERM Left 02/18/2019   Procedure: LEFT BREAST RECONSTRUCTION WITH PLACEMENT OF TISSUE EXPANDER AND ALLODERM;  Surgeon: Irene Limbo, MD;  Location: [place];  Service: Plastics;  Laterality: Left;  . CARDIAC CATHETERIZATION  07/2003   no evidence of CAD  . COLONOSCOPY N/A 02/17/2017   Procedure: COLONOSCOPY;  Surgeon: Daneil Dolin, MD;  [facility];  Service: Endoscopy;  Laterality: N/A;  10:00am  . MASTECTOMY W/ SENTINEL NODE BIOPSY Left 02/18/2019   Procedure: LEFT MASTECTOMY WITH LEFT AXILLARY SENTINEL LYMPH NODE BIOPSY;  Surgeon: Alphonsa Overall, MD;  Location: [place];  Service: General;  Laterality: Left;  . POLYPECTOMY  02/17/2017   Procedure: POLYPECTOMY;  Surgeon: Daneil Dolin, MD;  [facility];  Service: Endoscopy;;  colon  . removal  cyst on chin    . TRANSTHORACIC ECHOCARDIOGRAM  03/2007   EF=>55%; borderline LA enlargement; MV mildly thickened/borderline MVP/mild MR    There were no vitals filed for this visit.  Subjective Assessment - 05/04/19 0808    Subjective  Pt states that she has been performing her exercises at home. She states that she has no pain.    Pertinent History  Patient was diagnosed on 11/26/2018 with left grade II-III invasive lobular carcinoma breast cancer. It is ER/PR positive, HER2 negative, and a Ki67 of 10%. Patient underwent a left mastectomy and sentinel node biopsy on 02/18/2019. One of 5 lymph nodes was positive but her Mammaprint came back low risk so no chemo is needed. She will undergo axillary and chest radiation.    Patient Stated Goals  Get my arm moving better.    Currently in Pain?  No/denies    Pain Score  0-No pain            LYMPHEDEMA/ONCOLOGY QUESTIONNAIRE - 05/04/19 0813      Left Upper Extremity Lymphedema   15 cm Proximal to Olecranon Process  39.9 cm    10 cm Proximal to Olecranon Process  37.1 cm    Olecranon Process  29 cm    15 cm Proximal to Ulnar Styloid Process  27.9 cm    10 cm Proximal to Ulnar Styloid Process  24.9 cm    Just Proximal to Ulnar  Styloid Process  18 cm    Across Hand at [employer]  20.4 cm    At [place] of 2nd Digit  6.9 cm                [facility] Adult PT Treatment/Exercise - 05/04/19 0001      Shoulder Exercises: Supine   Other Supine Exercises  Supine flexion w/dowel 10x, ER in supine w/hands behind head 10x for warm up prior to beginning exercises       Shoulder Exercises: Seated   Other Seated Exercises  Seated trunk rotation to the L w/end range scapular stretch to improve ROM at the L shoulder 5x Bil prior to standing exercises and strength after breast cancer activities.       Shoulder Exercises: Standing   Other Standing Exercises  AROM flexion/abd at the wall 10x w/5 secondhold and end range stretch 10x each LUE only  VC for light stretching and hold at end range.     Other Standing Exercises  Standing shoulder flexion into I, Standing shoulder flexion into Y, Standing shoulder abd 10x each, 3 way shoulder with 1# wgts following to 90 degrees in flexion, V and T 10x each with VC to avoid pain and to keep the L elbow extended throughout with good response from pt following cueing.       Manual Therapy   Manual Therapy  Manual Lymphatic Drainage (MLD)    Manual Lymphatic Drainage (MLD)  5 deep breathes followed by stimulation of  Bil axillary lymph nodes, anterior inter-axillray anastomosis, Bil inguinal nodes, L axillo-inguinal anastomosis, L brachium medial-lateral then proximal to distal, antebrachium proximal to distal anterior then posterior including dorsum of the hand then re-working all surfaces. Pt was taught step by step with instruction and return demonstration throughout with therapist and pt performing each step 5x with 1-2 repetitions for re-working the surface.              PT Education - 05/04/19 8889    Education Details  Continue with current HEP at this time. Pt will begin performing self MLD at home. Pt was educated on self MLD with VC for correct pressure, skin stretch, direction and re-iterated education on the anatomy and physiology of the lymphatic system in order to facilitate learning and success with self MLD.    Person(s) Educated  Patient    Methods  Explanation;Demonstration;Tactile cues;Verbal cues;Handout    Comprehension  Verbalized understanding;Returned demonstration       PT Short Term Goals - 04/07/19 1726      PT SHORT TERM GOAL #1   Title  Pt will demonstrate 120 degrees flexion/abd within 3 weeks in order to demonstrate improvement of LUE functional ROM.    Baseline  98 degrees flexion, 77 degrees abd LUE    Period  Weeks    Status  New    Target Date  04/27/19        PT Long Term Goals - 04/07/19 1727      PT LONG TERM GOAL #1   Time  5    Period  Weeks     Status  On-going      PT LONG TERM GOAL #2   Title  Patient will increase left shoulder active flexion to >/= 140 degrees for increased ease reaching overhead.    Baseline  98 degrees flexion 04/06/19    Time  5    Period  Weeks    Status  On-going  PT LONG TERM GOAL #3   Title  Patient will increase left shoulder active abduction to >/= 150 degrees for increased ease reaching overhead.    Baseline  77 degrees 04/06/19    Time  5    Period  Weeks    Status  On-going      PT LONG TERM GOAL #4   Title  Patient will decrease DASH score to </= 5 for improved overall shoulder and upper extremity function.    Baseline  63% arm disability 04/06/19    Time  5    Period  Weeks    Status  On-going            Plan - 05/04/19 0807    Clinical Impression Statement  Pt presents to physical therapy with slight increase in measurements of the LUE since her initial evaluation indicating possible stage I lymphedema post mastectomy. Pt has an appt set up with [facility] for Next Tuesday 05/10/19 and we are waiting on sign script from MD for compression garments. Pt was able to tolerate an increase in anti-gravity and light weights activities this session with good ROM and no reports of increased pain from baseline. Pt was taught self MLD this session with emphasis on skin stretch, pressure and direction to facilitate lymphatic flow and decrease sensation of fullness and tingling that pt is reporting with end range flexion inferior to the L breast. Pt was shown each step of self MLD performed by the physical therapist with pt return demonstration and voice cueing for correct technique. Pt will benefit from continued POC.    Stability/Clinical Decision Making  Stable/Uncomplicated    Rehab Potential  Excellent    PT Frequency  2x / week    PT Duration  6 weeks    PT Treatment/Interventions  ADLs/Self Care Home Management;Therapeutic exercise;Patient/family education;Manual techniques;Manual lymph  drainage;Therapeutic activities;Scar mobilization;Passive range of motion    PT Next Visit Plan  Check on script for compression, begin strength after breast cancer, assess self MLD of the LUE teach self MLD of the L breast ask if there are any questions, re-measure arm.    PT Home Exercise Plan  Access Code: [email or web address], perform [place] exercises at home.    Consulted and Agree with Plan of Care  Patient       Patient will benefit from skilled therapeutic intervention in order to improve the following deficits and impairments:  Decreased knowledge of precautions, Impaired UE functional use, Pain, Postural dysfunction, Decreased range of motion, Increased fascial restricitons  Visit Diagnosis: Abnormal posture  Aftercare following surgery for neoplasm  Stiffness of left shoulder, not elsewhere classified  Malignant neoplasm of overlapping sites of left breast in female, estrogen receptor positive ([place])     Problem List Patient Active Problem List   Diagnosis Date Noted  . Breast cancer, left breast ([place]) 02/18/2019  . Malignant neoplasm of overlapping sites of left breast in female, estrogen receptor positive ([place]) 12/17/2018  . Moderate obesity 03/13/2016  . Palpitations 03/06/2013  . Hyperlipidemia, mixed 03/06/2013  . Hypertension 02/14/2013  . Non-insulin treated type 2 diabetes mellitus ([place]) 02/14/2013    Tracy Knight, PT 05/04/2019, 8:59 AM  [place], [place], [address] Phone: [phone]   Fax:  [phone]  Name: Tracy Knight MRN: [id number] Date of Birth: 05-13-1961

## 2019-05-05 ENCOUNTER — Other Ambulatory Visit: Payer: Self-pay

## 2019-05-05 ENCOUNTER — Ambulatory Visit
Admission: RE | Admit: 2019-05-05 | Discharge: 2019-05-05 | Disposition: A | Discharge status: Home / Self Care | Payer: BC Managed Care – PPO | Source: Ambulatory Visit | Attending: Radiation Oncology | Admitting: Radiation Oncology

## 2019-05-05 DIAGNOSIS — C50812 Malignant neoplasm of overlapping sites of left female breast: Secondary | ICD-10-CM | POA: Diagnosis not present

## 2019-05-05 DIAGNOSIS — Z170 Estrogen receptor positive status [ER+]: Secondary | ICD-10-CM | POA: Diagnosis not present

## 2019-05-05 DIAGNOSIS — C50412 Malignant neoplasm of upper-outer quadrant of left female breast: Secondary | ICD-10-CM | POA: Diagnosis not present

## 2019-05-06 ENCOUNTER — Other Ambulatory Visit: Payer: Self-pay

## 2019-05-06 ENCOUNTER — Ambulatory Visit
Admission: RE | Admit: 2019-05-06 | Discharge: 2019-05-06 | Disposition: A | Discharge status: Home / Self Care | Payer: BC Managed Care – PPO | Source: Ambulatory Visit | Attending: Radiation Oncology | Admitting: Radiation Oncology

## 2019-05-06 DIAGNOSIS — C50412 Malignant neoplasm of upper-outer quadrant of left female breast: Secondary | ICD-10-CM | POA: Diagnosis not present

## 2019-05-06 DIAGNOSIS — C50812 Malignant neoplasm of overlapping sites of left female breast: Secondary | ICD-10-CM | POA: Diagnosis not present

## 2019-05-06 DIAGNOSIS — Z170 Estrogen receptor positive status [ER+]: Secondary | ICD-10-CM | POA: Diagnosis not present

## 2019-05-09 ENCOUNTER — Other Ambulatory Visit: Payer: Self-pay

## 2019-05-09 ENCOUNTER — Ambulatory Visit
Admission: RE | Admit: 2019-05-09 | Discharge: 2019-05-09 | Disposition: A | Discharge status: Home / Self Care | Payer: BC Managed Care – PPO | Source: Ambulatory Visit | Attending: Radiation Oncology | Admitting: Radiation Oncology

## 2019-05-09 DIAGNOSIS — C50812 Malignant neoplasm of overlapping sites of left female breast: Secondary | ICD-10-CM | POA: Diagnosis not present

## 2019-05-09 DIAGNOSIS — C50412 Malignant neoplasm of upper-outer quadrant of left female breast: Secondary | ICD-10-CM | POA: Diagnosis not present

## 2019-05-09 DIAGNOSIS — Z170 Estrogen receptor positive status [ER+]: Secondary | ICD-10-CM | POA: Diagnosis not present

## 2019-05-10 ENCOUNTER — Other Ambulatory Visit: Payer: Self-pay

## 2019-05-10 ENCOUNTER — Ambulatory Visit
Admission: RE | Admit: 2019-05-10 | Discharge: 2019-05-10 | Disposition: A | Discharge status: Home / Self Care | Payer: BC Managed Care – PPO | Source: Ambulatory Visit | Attending: Radiation Oncology | Admitting: Radiation Oncology

## 2019-05-10 DIAGNOSIS — C50812 Malignant neoplasm of overlapping sites of left female breast: Secondary | ICD-10-CM | POA: Diagnosis not present

## 2019-05-10 DIAGNOSIS — Z170 Estrogen receptor positive status [ER+]: Secondary | ICD-10-CM | POA: Diagnosis not present

## 2019-05-10 DIAGNOSIS — C50412 Malignant neoplasm of upper-outer quadrant of left female breast: Secondary | ICD-10-CM | POA: Diagnosis not present

## 2019-05-11 ENCOUNTER — Ambulatory Visit
Admission: RE | Admit: 2019-05-11 | Discharge: 2019-05-11 | Disposition: A | Discharge status: Home / Self Care | Payer: BC Managed Care – PPO | Source: Ambulatory Visit | Attending: Radiation Oncology | Admitting: Radiation Oncology

## 2019-05-11 ENCOUNTER — Ambulatory Visit: Payer: BC Managed Care – PPO

## 2019-05-11 ENCOUNTER — Other Ambulatory Visit: Payer: Self-pay

## 2019-05-11 DIAGNOSIS — Z483 Aftercare following surgery for neoplasm: Secondary | ICD-10-CM | POA: Diagnosis not present

## 2019-05-11 DIAGNOSIS — Z170 Estrogen receptor positive status [ER+]: Secondary | ICD-10-CM | POA: Diagnosis not present

## 2019-05-11 DIAGNOSIS — C50812 Malignant neoplasm of overlapping sites of left female breast: Secondary | ICD-10-CM | POA: Diagnosis not present

## 2019-05-11 DIAGNOSIS — R293 Abnormal posture: Secondary | ICD-10-CM

## 2019-05-11 DIAGNOSIS — C50412 Malignant neoplasm of upper-outer quadrant of left female breast: Secondary | ICD-10-CM | POA: Diagnosis not present

## 2019-05-11 DIAGNOSIS — M25612 Stiffness of left shoulder, not elsewhere classified: Secondary | ICD-10-CM

## 2019-05-11 NOTE — Therapy (Addendum)
[place], [place], [address] Phone: [phone]   Fax:  [phone]  Physical Therapy Treatment  Progress Note Reporting Period 03/10/19 to 05/11/19  See note below for Objective Data and Assessment of Progress/Goals.       Patient Details  Name: Tracy Knight MRN: [id number] Date of Birth: December 11, 1960 Referring Provider (PT): Dr. Alphonsa Overall   Encounter Date: 05/11/2019  PT End of Session - 05/11/19 0806    Visit Number  15    Number of Visits  20    Date for PT Re-Evaluation  05/11/19    PT Start Time  0804    PT Stop Time  0859    PT Time Calculation (min)  55 min    Activity Tolerance  Patient tolerated treatment well    Behavior During Therapy  [facility] for tasks assessed/performed       Past Medical History:  Diagnosis Date  . Asthma   . Cancer ([place]) 11/2018   left breast cancer  . Diabetes mellitus without complication ([place])   . Dyslipidemia   . History of nuclear stress test 02/2009   exercise; normal pattern of perfusion; no significant ischemia demonstrated; low risk scan   . Hypertension     Past Surgical History:  Procedure Laterality Date  . BREAST RECONSTRUCTION WITH PLACEMENT OF TISSUE EXPANDER AND ALLODERM Left 02/18/2019   Procedure: LEFT BREAST RECONSTRUCTION WITH PLACEMENT OF TISSUE EXPANDER AND ALLODERM;  Surgeon: Irene Limbo, MD;  Location: [place];  Service: Plastics;  Laterality: Left;  . CARDIAC CATHETERIZATION  07/2003   no evidence of CAD  . COLONOSCOPY N/A 02/17/2017   Procedure: COLONOSCOPY;  Surgeon: Daneil Dolin, MD;  [facility];  Service: Endoscopy;  Laterality: N/A;  10:00am  . MASTECTOMY W/ SENTINEL NODE BIOPSY Left 02/18/2019   Procedure: LEFT MASTECTOMY WITH LEFT AXILLARY SENTINEL LYMPH NODE BIOPSY;  Surgeon: Alphonsa Overall, MD;  Location: [place];  Service: General;  Laterality: Left;  . POLYPECTOMY   02/17/2017   Procedure: POLYPECTOMY;  Surgeon: Daneil Dolin, MD;  [facility];  Service: Endoscopy;;  colon  . removal cyst on chin    . TRANSTHORACIC ECHOCARDIOGRAM  03/2007   EF=>55%; borderline LA enlargement; MV mildly thickened/borderline MVP/mild MR    There were no vitals filed for this visit.  Subjective Assessment - 05/11/19 0806    Subjective  Pt reports that she has no pain. She states that [employer] came out yesterday to measure her arm and who her a demonstration of the vasopneumatic pump. She states that it felt really good and seemed to help with her symptoms.    Pertinent History  Patient was diagnosed on 11/26/2018 with left grade II-III invasive lobular carcinoma breast cancer. It is ER/PR positive, HER2 negative, and a Ki67 of 10%. Patient underwent a left mastectomy and sentinel node biopsy on 02/18/2019. One of 5 lymph nodes was positive but her Mammaprint came back low risk so no chemo is needed. She will undergo axillary and chest radiation.    Patient Stated Goals  Get my arm moving better.    Currently in Pain?  No/denies    Pain Score  0-No pain         [facility] PT Assessment - 05/11/19 0001      AROM   Left Shoulder Flexion  131 Degrees    Left Shoulder ABduction  132 Degrees  LYMPHEDEMA/ONCOLOGY QUESTIONNAIRE - 05/11/19 0828      Left Upper Extremity Lymphedema   15 cm Proximal to Olecranon Process  40.2 cm    10 cm Proximal to Olecranon Process  37 cm    Olecranon Process  29.1 cm    15 cm Proximal to Ulnar Styloid Process  27.4 cm    10 cm Proximal to Ulnar Styloid Process  24 cm    Just Proximal to Ulnar Styloid Process  17.4 cm    Across Hand at [employer]  20.9 cm    At [place] of 2nd Digit  6.7 cm        Quick Dash - 05/11/19 0001    Open a tight or new jar  Mild difficulty    Do heavy household chores (wash walls, wash floors)  No difficulty    Carry a shopping bag or briefcase  No difficulty    Wash your back   Mild difficulty    Use a knife to cut food  No difficulty    Recreational activities in which you take some force or impact through your arm, shoulder, or hand (golf, hammering, tennis)  No difficulty    During the past week, to what extent has your arm, shoulder or hand problem interfered with your normal social activities with family, friends, neighbors, or groups?  Not at all    During the past week, to what extent has your arm, shoulder or hand problem limited your work or other regular daily activities  Not at all    Arm, shoulder, or hand pain.  Mild    Tingling (pins and needles) in your arm, shoulder, or hand  None    Difficulty Sleeping  Mild difficulty    DASH Score  9.09 %             [facility] Adult PT Treatment/Exercise - 05/11/19 0001      Manual Therapy   Manual Therapy  Manual Lymphatic Drainage (MLD);Myofascial release    Myofascial Release  IASTM was performed on the L lateral trunk inferiot to the axilla where pt was reporting pulling during LUE shoulder flexion with good results increasing ROM by 17 degrees in flexion    Manual Lymphatic Drainage (MLD)  5 deep breathes, swimming in the terminus, Bil axillary nodes, Bil inguinal nodes, anterior inter-axillary anastomosis, L axillo-inguinal anastomosis, superior portion of the L breast, anterior inter-axillary anastomosis, inferior portion of the breast, L axillo-inguinal anastomosis, re-working all surfaces. Pt was educated step by step throughout the process with direction an performed return demonstration for each step multiple timesfor accuracy.              PT Education - 05/11/19 [phone]    Education Details  Pt was educated on self MLD of the L breast with demonstration followed by return demonstration and verbal/tactile cueing for direction, technique including hand placement. Pt was educated on compression garment and provided with her script for a compression sleeve that she will go and get at [place].  Discussed POC with patient including 2 more visits 1x/week to finish up her POC make sure she has adequate compression and exercises to manage her lymphedema long term.    Person(s) Educated  Patient    Methods  Explanation;Demonstration;Tactile cues;Verbal cues;Handout    Comprehension  Verbalized understanding;Returned demonstration       PT Short Term Goals - 05/11/19 0814      PT SHORT TERM GOAL #1   Title  Pt will demonstrate 120 degrees flexion/abd within 3 weeks in order to demonstrate improvement of LUE functional ROM.    Baseline  131 degrees flexion, 132 degrees abdct 05/11/19    Status  Achieved        PT Long Term Goals - 05/11/19 0815      PT LONG TERM GOAL #1   Title  Patient will demonstrate she has regained shoulder ROM and function post operatively compared to baseline assessments.    Baseline  Pt is progressing she has not quite reached her pre-surgical A/ROM in her L shoulder 131 degrees flexion, 132 degrees abdct    Status  Partially Met      PT LONG TERM GOAL #2   Title  Patient will increase left shoulder active flexion to >/= 140 degrees for increased ease reaching overhead.    Baseline  98 degrees flexion 04/06/19, 131 degrees flexion 05/11/19    Status  Partially Met      PT LONG TERM GOAL #3   Title  Patient will increase left shoulder active abduction to >/= 150 degrees for increased ease reaching overhead.    Baseline  77 degrees 04/06/19, 132 degrees abdcution 05/11/2019    Status  Partially Met      PT LONG TERM GOAL #4   Title  Patient will decrease DASH score to </= 5 for improved overall shoulder and upper extremity function.    Baseline  63% arm disability 04/06/19, 9.09% disability 05/11/2019    Status  Partially Met      PT LONG TERM GOAL #5   Title  Patient will report she is able to return to her biking and other exercises for helping to reduce recurrence risk and general overall better health.    Baseline  Pt reports that she is back to  riding her bike for 30 minutes and walking for about 1.5 miles    Status  Achieved      PT LONG TERM GOAL #6   Title  Patient will verbalize good understanding of lymphedema risk reduction practices.    Baseline  pt is able to name multiple reasons to contact her MD for possible lymphedema exacerbation including (heaviness, aching, swelling) and ways to manage lymphedema including wearing her compression, using pump, performing MLD and exercises.    Status  Achieved            Plan - 05/11/19 0805    Clinical Impression Statement  IASTM was performed over the L lateral trunk under the axilla due to pt reports of tightness with UE flexion. L shoulder flexion 131 prior to IASTM; 148 following IASTM demonstrating significant improvement and myofascial release. Pt was taught self MLD for the L breast with a focus on direction and hand placement some VC for skin stretch but overall pt has improved significantly with her pressure technique for MLD of the LUE. Pt is going to get her compression sleeve soon and was provided with script this session. She has an appt to get measured for a compression bra next month. Pt will benefit from continued physical therapy services 1x/week for 2 weeks in order to make sure that she has what she needs for sufficient management of her lymphedema in order to promote autonomy of care and address the above limitations.    PT Frequency  2x / week    PT Duration  6 weeks    PT Treatment/Interventions  ADLs/Self Care Home Management;Therapeutic exercise;Patient/family education;Manual techniques;Manual lymph drainage;Therapeutic activities;Scar mobilization;Passive range  of motion    PT Next Visit Plan  begin strength after breast cancer, assess self MLD of the LUE/breast, ask if she got a sleeve and assess if she does, ask if she has received pump    PT Home Exercise Plan  Access Code: [email or web address], perform [place] exercises at home.    Consulted and Agree with Plan of Care   Patient       Patient will benefit from skilled therapeutic intervention in order to improve the following deficits and impairments:  Decreased knowledge of precautions, Impaired UE functional use, Pain, Postural dysfunction, Decreased range of motion, Increased fascial restricitons  Visit Diagnosis: Abnormal posture  Aftercare following surgery for neoplasm  Stiffness of left shoulder, not elsewhere classified  Malignant neoplasm of overlapping sites of left breast in female, estrogen receptor positive ([place])     Problem List Patient Active Problem List   Diagnosis Date Noted  . Breast cancer, left breast ([place]) 02/18/2019  . Malignant neoplasm of overlapping sites of left breast in female, estrogen receptor positive ([place]) 12/17/2018  . Moderate obesity 03/13/2016  . Palpitations 03/06/2013  . Hyperlipidemia, mixed 03/06/2013  . Hypertension 02/14/2013  . Non-insulin treated type 2 diabetes mellitus ([place]) 02/14/2013    Ander Purpura, PT 05/11/2019, 8:59 AM  [place], [place], [address] Phone: [phone]   Fax:  [phone]  Name: DESTENIE INGBER MRN: [id number] Date of Birth: [birth year]

## 2019-05-11 NOTE — Patient Instructions (Signed)
Self manual lymph drainage: Perform this sequence once a day.  Only give enough pressure no your skin to make the skin move.  Diaphragmatic - Supine   Inhale through nose making navel move out toward hands. Exhale through puckered lips, hands follow navel in. Repeat _5__ times. Rest _10__ seconds between repeats.   Copyright  VHI. All rights reserved.  Hug yourself.  Do circles at your neck just above your collarbones.  Repeat this 10 times.  Axilla - One at a Time   Using full weight of flat hand and fingers at center of uninvolved armpit, make _10__ in-place circles.   Copyright  VHI. All rights reserved.  LEG: Inguinal Nodes Stimulation   With small finger side of hand against hip crease on both sides, gently perform circles at the crease. Repeat __10_ times.   Copyright  VHI. All rights reserved.  1) Axilla to Inguinal Nodes - Sweep   On involved side, sweep _4__ times from armpit along side of trunk to hip crease.  Now gently stretch skin from the involved side to the uninvolved side across the chest at the shoulder line.  Repeat that 4 times.  Draw an imaginary diagonal line from upper outer breast through the nipple area toward lower inner breast.  Direct fluid upward and inward from this line toward the pathway across your upper chest .  Do this in three rows to treat all of the upper inner breast tissue, and do each row 3-4x.      Direct fluid to treat all of lower outer breast tissue downward and outward toward      pathway that is aimed at the left groin.  Finish by doing the pathways as described above going from your involved armpit to the same side groin and going across your upper chest from the involved shoulder to the uninvolved shoulder.  Repeat the steps above where you do circles in your left groin and right armpit. Copyright  VHI. All rights reserved.

## 2019-05-11 NOTE — Addendum Note (Signed)
Addended by: Ander Purpura on: 05/11/2019 10:14 AM   Modules accepted: Orders

## 2019-05-12 ENCOUNTER — Other Ambulatory Visit: Payer: Self-pay

## 2019-05-12 ENCOUNTER — Ambulatory Visit
Admission: RE | Admit: 2019-05-12 | Discharge: 2019-05-12 | Disposition: A | Discharge status: Home / Self Care | Payer: BC Managed Care – PPO | Source: Ambulatory Visit | Attending: Radiation Oncology | Admitting: Radiation Oncology

## 2019-05-12 DIAGNOSIS — C50812 Malignant neoplasm of overlapping sites of left female breast: Secondary | ICD-10-CM | POA: Diagnosis not present

## 2019-05-12 DIAGNOSIS — Z170 Estrogen receptor positive status [ER+]: Secondary | ICD-10-CM | POA: Diagnosis not present

## 2019-05-12 DIAGNOSIS — C50412 Malignant neoplasm of upper-outer quadrant of left female breast: Secondary | ICD-10-CM | POA: Diagnosis not present

## 2019-05-13 ENCOUNTER — Ambulatory Visit
Admission: RE | Admit: 2019-05-13 | Discharge: 2019-05-13 | Disposition: A | Discharge status: Home / Self Care | Payer: BC Managed Care – PPO | Source: Ambulatory Visit | Attending: Radiation Oncology | Admitting: Radiation Oncology

## 2019-05-13 ENCOUNTER — Other Ambulatory Visit: Payer: Self-pay

## 2019-05-13 DIAGNOSIS — Z170 Estrogen receptor positive status [ER+]: Secondary | ICD-10-CM | POA: Diagnosis not present

## 2019-05-13 DIAGNOSIS — C50812 Malignant neoplasm of overlapping sites of left female breast: Secondary | ICD-10-CM | POA: Diagnosis not present

## 2019-05-13 DIAGNOSIS — C50412 Malignant neoplasm of upper-outer quadrant of left female breast: Secondary | ICD-10-CM | POA: Diagnosis not present

## 2019-05-16 ENCOUNTER — Other Ambulatory Visit: Payer: Self-pay

## 2019-05-16 ENCOUNTER — Ambulatory Visit
Admission: RE | Admit: 2019-05-16 | Discharge: 2019-05-16 | Disposition: A | Discharge status: Home / Self Care | Payer: BC Managed Care – PPO | Source: Ambulatory Visit | Attending: Radiation Oncology | Admitting: Radiation Oncology

## 2019-05-16 DIAGNOSIS — C50412 Malignant neoplasm of upper-outer quadrant of left female breast: Secondary | ICD-10-CM | POA: Diagnosis not present

## 2019-05-16 DIAGNOSIS — Z170 Estrogen receptor positive status [ER+]: Secondary | ICD-10-CM | POA: Diagnosis not present

## 2019-05-16 DIAGNOSIS — C50812 Malignant neoplasm of overlapping sites of left female breast: Secondary | ICD-10-CM | POA: Diagnosis not present

## 2019-05-17 ENCOUNTER — Other Ambulatory Visit: Payer: Self-pay

## 2019-05-17 ENCOUNTER — Ambulatory Visit
Admission: RE | Admit: 2019-05-17 | Discharge: 2019-05-17 | Disposition: A | Discharge status: Home / Self Care | Payer: BC Managed Care – PPO | Source: Ambulatory Visit | Attending: Radiation Oncology | Admitting: Radiation Oncology

## 2019-05-17 DIAGNOSIS — C50412 Malignant neoplasm of upper-outer quadrant of left female breast: Secondary | ICD-10-CM | POA: Diagnosis not present

## 2019-05-17 DIAGNOSIS — C50812 Malignant neoplasm of overlapping sites of left female breast: Secondary | ICD-10-CM | POA: Diagnosis not present

## 2019-05-17 DIAGNOSIS — Z170 Estrogen receptor positive status [ER+]: Secondary | ICD-10-CM | POA: Diagnosis not present

## 2019-05-18 ENCOUNTER — Other Ambulatory Visit: Payer: Self-pay

## 2019-05-18 ENCOUNTER — Ambulatory Visit
Admission: RE | Admit: 2019-05-18 | Discharge: 2019-05-18 | Disposition: A | Discharge status: Home / Self Care | Payer: BC Managed Care – PPO | Source: Ambulatory Visit | Attending: Radiation Oncology | Admitting: Radiation Oncology

## 2019-05-18 ENCOUNTER — Ambulatory Visit: Payer: BC Managed Care – PPO

## 2019-05-18 DIAGNOSIS — M25612 Stiffness of left shoulder, not elsewhere classified: Secondary | ICD-10-CM | POA: Diagnosis not present

## 2019-05-18 DIAGNOSIS — C50812 Malignant neoplasm of overlapping sites of left female breast: Secondary | ICD-10-CM

## 2019-05-18 DIAGNOSIS — R293 Abnormal posture: Secondary | ICD-10-CM | POA: Diagnosis not present

## 2019-05-18 DIAGNOSIS — Z483 Aftercare following surgery for neoplasm: Secondary | ICD-10-CM

## 2019-05-18 DIAGNOSIS — C50412 Malignant neoplasm of upper-outer quadrant of left female breast: Secondary | ICD-10-CM | POA: Diagnosis not present

## 2019-05-18 DIAGNOSIS — Z170 Estrogen receptor positive status [ER+]: Secondary | ICD-10-CM | POA: Diagnosis not present

## 2019-05-18 NOTE — Therapy (Signed)
[place], [place], [address] Phone: [phone]   Fax:  [phone]  Physical Therapy Treatment  Patient Details  Name: Tracy Knight MRN: [id number] Date of Birth: January 16, 1961 Referring Provider (PT): Dr. Alphonsa Overall   Encounter Date: 05/18/2019  PT End of Session - 05/18/19 0807    Visit Number  16    Number of Visits  20    Date for PT Re-Evaluation  05/25/19   was not updated at progress note   PT Start Time  0802    PT Stop Time  0900    PT Time Calculation (min)  58 min    Activity Tolerance  Patient tolerated treatment well    Behavior During Therapy  [facility] for tasks assessed/performed       Past Medical History:  Diagnosis Date  . Asthma   . Cancer ([place]) 11/2018   left breast cancer  . Diabetes mellitus without complication ([place])   . Dyslipidemia   . History of nuclear stress test 02/2009   exercise; normal pattern of perfusion; no significant ischemia demonstrated; low risk scan   . Hypertension     Past Surgical History:  Procedure Laterality Date  . BREAST RECONSTRUCTION WITH PLACEMENT OF TISSUE EXPANDER AND ALLODERM Left 02/18/2019   Procedure: LEFT BREAST RECONSTRUCTION WITH PLACEMENT OF TISSUE EXPANDER AND ALLODERM;  Surgeon: Irene Limbo, MD;  Location: [place];  Service: Plastics;  Laterality: Left;  . CARDIAC CATHETERIZATION  07/2003   no evidence of CAD  . COLONOSCOPY N/A 02/17/2017   Procedure: COLONOSCOPY;  Surgeon: Daneil Dolin, MD;  [facility];  Service: Endoscopy;  Laterality: N/A;  10:00am  . MASTECTOMY W/ SENTINEL NODE BIOPSY Left 02/18/2019   Procedure: LEFT MASTECTOMY WITH LEFT AXILLARY SENTINEL LYMPH NODE BIOPSY;  Surgeon: Alphonsa Overall, MD;  Location: [place];  Service: General;  Laterality: Left;  . POLYPECTOMY  02/17/2017   Procedure: POLYPECTOMY;  Surgeon: Daneil Dolin, MD;  [facility];   Service: Endoscopy;;  colon  . removal cyst on chin    . TRANSTHORACIC ECHOCARDIOGRAM  03/2007   EF=>55%; borderline LA enlargement; MV mildly thickened/borderline MVP/mild MR    There were no vitals filed for this visit.  Subjective Assessment - 05/18/19 0804    Subjective  Pt states that she has been doing her exercises at home. She has no pain and is feeling much better about doing activities at hom eusing her arm.    Pertinent History  Patient was diagnosed on 11/26/2018 with left grade II-III invasive lobular carcinoma breast cancer. It is ER/PR positive, HER2 negative, and a Ki67 of 10%. Patient underwent a left mastectomy and sentinel node biopsy on 02/18/2019. One of 5 lymph nodes was positive but her Mammaprint came back low risk so no chemo is needed. She will undergo axillary and chest radiation.    Patient Stated Goals  Get my arm moving better.    Currently in Pain?  No/denies    Pain Score  0-No pain                       [facility] Adult PT Treatment/Exercise - 05/18/19 0001      Exercises   Exercises  Other Exercises    Other Exercises   Strength after breast cancer exercises, Unilateral pec stretch 2x 20 seconds, posterior shoulder stretch 2x 20 seconds, Tricep stretch modified on wall 2x 20  seconds, calf stretch 1x 20 seconds Bil,  Bil modified quad stretch on chair 1x 20 seconds Bil, seated butterfly stretch 2x 20 seconds, Seated hamstring stretch 1x 20 seconds Bil, Supine LB stretch in hook-lying 1x 20 seconds Bil w/VC for transverse abdominus activation, Glute bridge 10x w/VC for glute activation, Transverse abdominus activation in supine with tactile cueing and VC for correct movement, Quadruped alt arm/leg with good core activation with VC for activating transverse abdominus 10x alt tactile cueing at the pelvis to prevent excessive rotation.       Manual Therapy   Manual Therapy  Manual Lymphatic Drainage (MLD)    Manual Lymphatic Drainage (MLD)  5 deep  breathes, swimming in the terminus, Bil axillary nodes, Bil inguinal nodes, anterior inter-axillary anastomosis, L axillo-inguinal anastomosis, superior portion of the L breast, anterior inter-axillary anastomosis, inferior portion of the breast, L axillo-inguinal anastomosis, L UE brachium from medial to lateral, lateral then all surfaces of the antebrachium and dorsum of the hand re-working all surfaces. Re-iterated education step by step throughout the process with direction an patient performed return demonstration for each step multiple times for accuracy with heavy VC for direction and skin stretch.              PT Education - 05/18/19 0903    Education Details  Pt was provided with strength after breast cancer sheet, went over self MLD with education giving demonstration with return demonstraiton and verbal/tactile cueing for direction and skin stretch. She has not yet received her compression pump or sleeve but has ordered both and will keep me updated on when these are received.    Person(s) Educated  Patient    Methods  Explanation;Demonstration;Tactile cues;Verbal cues;Handout    Comprehension  Verbalized understanding;Returned demonstration       PT Short Term Goals - 05/11/19 0814      PT SHORT TERM GOAL #1   Title  Pt will demonstrate 120 degrees flexion/abd within 3 weeks in order to demonstrate improvement of LUE functional ROM.    Baseline  131 degrees flexion, 132 degrees abdct 05/11/19    Status  Achieved        PT Long Term Goals - 05/11/19 0815      PT LONG TERM GOAL #1   Title  Patient will demonstrate she has regained shoulder ROM and function post operatively compared to baseline assessments.    Baseline  Pt is progressing she has not quite reached her pre-surgical A/ROM in her L shoulder 131 degrees flexion, 132 degrees abdct    Status  Partially Met      PT LONG TERM GOAL #2   Title  Patient will increase left shoulder active flexion to >/= 140 degrees  for increased ease reaching overhead.    Baseline  98 degrees flexion 04/06/19, 131 degrees flexion 05/11/19    Status  Partially Met      PT LONG TERM GOAL #3   Title  Patient will increase left shoulder active abduction to >/= 150 degrees for increased ease reaching overhead.    Baseline  77 degrees 04/06/19, 132 degrees abdcution 05/11/2019    Status  Partially Met      PT LONG TERM GOAL #4   Title  Patient will decrease DASH score to </= 5 for improved overall shoulder and upper extremity function.    Baseline  63% arm disability 04/06/19, 9.09% disability 05/11/2019    Status  Partially Met      PT LONG TERM GOAL #  5   Title  Patient will report she is able to return to her biking and other exercises for helping to reduce recurrence risk and general overall better health.    Baseline  Pt reports that she is back to riding her bike for 30 minutes and walking for about 1.5 miles    Status  Achieved      PT LONG TERM GOAL #6   Title  Patient will verbalize good understanding of lymphedema risk reduction practices.    Baseline  pt is able to name multiple reasons to contact her MD for possible lymphedema exacerbation including (heaviness, aching, swelling) and ways to manage lymphedema including wearing her compression, using pump, performing MLD and exercises.    Status  Achieved            Plan - 05/18/19 0805    Clinical Impression Statement  Pt performed self MLD of the L breast and LUE with difficulty understanding the concept of skin stretch, she was able to verbalize understanding and demonstration good skin stretch by end of session. Pt was able to tolerate an increase in difficutly with the strength after breast cancer program without an increase in pain from baseline. Crunch was modified to pelvic tilt and pt needed modifications for quad stretch/tricep stretch. She will benefit from continued POC at this time.    PT Frequency  2x / week    PT Duration  6 weeks    PT  Treatment/Interventions  ADLs/Self Care Home Management;Therapeutic exercise;Patient/family education;Manual techniques;Manual lymph drainage;Therapeutic activities;Scar mobilization;Passive range of motion    PT Next Visit Plan  Check if sleeve/pump have come in, finish strength after breast cancer from pg 15, progress/discharge note, assess pt ability to perform skin stretch with MLD    PT Home Exercise Plan  Access Code: [email or web address], perform [place] exercises at home.    Consulted and Agree with Plan of Care  Patient       Patient will benefit from skilled therapeutic intervention in order to improve the following deficits and impairments:  Decreased knowledge of precautions, Impaired UE functional use, Pain, Postural dysfunction, Decreased range of motion, Increased fascial restricitons  Visit Diagnosis: Abnormal posture  Aftercare following surgery for neoplasm  Stiffness of left shoulder, not elsewhere classified  Malignant neoplasm of overlapping sites of left breast in female, estrogen receptor positive ([place])     Problem List Patient Active Problem List   Diagnosis Date Noted  . Breast cancer, left breast ([place]) 02/18/2019  . Malignant neoplasm of overlapping sites of left breast in female, estrogen receptor positive ([place]) 12/17/2018  . Moderate obesity 03/13/2016  . Palpitations 03/06/2013  . Hyperlipidemia, mixed 03/06/2013  . Hypertension 02/14/2013  . Non-insulin treated type 2 diabetes mellitus ([place]) 02/14/2013    Ander Purpura, PT 05/18/2019, 9:08 AM  [place], [place], [address] Phone: [phone]   Fax:  [phone]  Name: Tracy Knight MRN: [id number] Date of Birth: [birth year]

## 2019-05-19 ENCOUNTER — Ambulatory Visit
Admission: RE | Admit: 2019-05-19 | Discharge: 2019-05-19 | Disposition: A | Discharge status: Home / Self Care | Payer: BC Managed Care – PPO | Source: Ambulatory Visit | Attending: Radiation Oncology | Admitting: Radiation Oncology

## 2019-05-19 ENCOUNTER — Other Ambulatory Visit: Payer: Self-pay

## 2019-05-19 DIAGNOSIS — C50812 Malignant neoplasm of overlapping sites of left female breast: Secondary | ICD-10-CM | POA: Diagnosis not present

## 2019-05-19 DIAGNOSIS — Z170 Estrogen receptor positive status [ER+]: Secondary | ICD-10-CM | POA: Diagnosis not present

## 2019-05-19 DIAGNOSIS — C50412 Malignant neoplasm of upper-outer quadrant of left female breast: Secondary | ICD-10-CM | POA: Diagnosis not present

## 2019-05-20 ENCOUNTER — Ambulatory Visit
Admission: RE | Admit: 2019-05-20 | Payer: BC Managed Care – PPO | Source: Ambulatory Visit | Admitting: Radiation Oncology

## 2019-05-20 ENCOUNTER — Other Ambulatory Visit: Payer: Self-pay

## 2019-05-20 ENCOUNTER — Ambulatory Visit
Admission: RE | Admit: 2019-05-20 | Discharge: 2019-05-20 | Disposition: A | Discharge status: Home / Self Care | Payer: BC Managed Care – PPO | Source: Ambulatory Visit | Attending: Radiation Oncology | Admitting: Radiation Oncology

## 2019-05-20 DIAGNOSIS — Z170 Estrogen receptor positive status [ER+]: Secondary | ICD-10-CM | POA: Diagnosis not present

## 2019-05-20 DIAGNOSIS — C50812 Malignant neoplasm of overlapping sites of left female breast: Secondary | ICD-10-CM | POA: Diagnosis not present

## 2019-05-20 DIAGNOSIS — C50412 Malignant neoplasm of upper-outer quadrant of left female breast: Secondary | ICD-10-CM | POA: Diagnosis not present

## 2019-05-22 ENCOUNTER — Other Ambulatory Visit: Payer: Self-pay

## 2019-05-22 ENCOUNTER — Ambulatory Visit
Admission: RE | Admit: 2019-05-22 | Discharge: 2019-05-22 | Disposition: A | Discharge status: Home / Self Care | Payer: BC Managed Care – PPO | Source: Ambulatory Visit | Attending: Radiation Oncology | Admitting: Radiation Oncology

## 2019-05-22 DIAGNOSIS — C50812 Malignant neoplasm of overlapping sites of left female breast: Secondary | ICD-10-CM | POA: Diagnosis not present

## 2019-05-22 DIAGNOSIS — Z170 Estrogen receptor positive status [ER+]: Secondary | ICD-10-CM | POA: Diagnosis not present

## 2019-05-22 DIAGNOSIS — C50412 Malignant neoplasm of upper-outer quadrant of left female breast: Secondary | ICD-10-CM | POA: Diagnosis not present

## 2019-05-23 ENCOUNTER — Ambulatory Visit
Admission: RE | Admit: 2019-05-23 | Discharge: 2019-05-23 | Disposition: A | Discharge status: Home / Self Care | Payer: BC Managed Care – PPO | Source: Ambulatory Visit | Attending: Radiation Oncology | Admitting: Radiation Oncology

## 2019-05-23 ENCOUNTER — Other Ambulatory Visit: Payer: Self-pay

## 2019-05-23 DIAGNOSIS — C50412 Malignant neoplasm of upper-outer quadrant of left female breast: Secondary | ICD-10-CM

## 2019-05-23 DIAGNOSIS — C50812 Malignant neoplasm of overlapping sites of left female breast: Secondary | ICD-10-CM | POA: Diagnosis not present

## 2019-05-23 DIAGNOSIS — Z170 Estrogen receptor positive status [ER+]: Secondary | ICD-10-CM | POA: Diagnosis not present

## 2019-05-23 MED ORDER — SONAFINE EX EMUL
1.0000 "application " | Freq: Two times a day (BID) | CUTANEOUS | Status: DC
  Administered 2019-05-23: 1 via TOPICAL

## 2019-05-24 ENCOUNTER — Ambulatory Visit: Payer: BC Managed Care – PPO

## 2019-05-24 ENCOUNTER — Other Ambulatory Visit: Payer: Self-pay

## 2019-05-24 ENCOUNTER — Ambulatory Visit
Admission: RE | Admit: 2019-05-24 | Discharge: 2019-05-24 | Disposition: A | Discharge status: Home / Self Care | Payer: BC Managed Care – PPO | Source: Ambulatory Visit | Attending: Radiation Oncology | Admitting: Radiation Oncology

## 2019-05-24 DIAGNOSIS — C50812 Malignant neoplasm of overlapping sites of left female breast: Secondary | ICD-10-CM | POA: Diagnosis not present

## 2019-05-24 DIAGNOSIS — R293 Abnormal posture: Secondary | ICD-10-CM | POA: Diagnosis not present

## 2019-05-24 DIAGNOSIS — M25612 Stiffness of left shoulder, not elsewhere classified: Secondary | ICD-10-CM | POA: Diagnosis not present

## 2019-05-24 DIAGNOSIS — Z483 Aftercare following surgery for neoplasm: Secondary | ICD-10-CM

## 2019-05-24 DIAGNOSIS — C50412 Malignant neoplasm of upper-outer quadrant of left female breast: Secondary | ICD-10-CM | POA: Diagnosis not present

## 2019-05-24 DIAGNOSIS — Z170 Estrogen receptor positive status [ER+]: Secondary | ICD-10-CM | POA: Diagnosis not present

## 2019-05-24 NOTE — Therapy (Signed)
[place], [place], [address] Phone: [phone]   Fax:  [phone]  Physical Therapy Progress Note  Progress Note Reporting Period 05/18/19 to 05/24/19  See note below for Objective Data and Assessment of Progress/Goals.       Patient Details  Name: Tracy Knight MRN: [id number] Date of Birth: 10/09/1960 Referring Provider (PT): Dr. Alphonsa Overall   Encounter Date: 05/24/2019  PT End of Session - 05/24/19 0805    Visit Number  17    Number of Visits  20    Date for PT Re-Evaluation  06/28/19    PT Start Time  0804    PT Stop Time  0900    PT Time Calculation (min)  56 min    Activity Tolerance  Patient tolerated treatment well    Behavior During Therapy  [facility] for tasks assessed/performed       Past Medical History:  Diagnosis Date  . Asthma   . Cancer ([place]) 11/2018   left breast cancer  . Diabetes mellitus without complication ([place])   . Dyslipidemia   . History of nuclear stress test 02/2009   exercise; normal pattern of perfusion; no significant ischemia demonstrated; low risk scan   . Hypertension     Past Surgical History:  Procedure Laterality Date  . BREAST RECONSTRUCTION WITH PLACEMENT OF TISSUE EXPANDER AND ALLODERM Left 02/18/2019   Procedure: LEFT BREAST RECONSTRUCTION WITH PLACEMENT OF TISSUE EXPANDER AND ALLODERM;  Surgeon: Irene Limbo, MD;  Location: [place];  Service: Plastics;  Laterality: Left;  . CARDIAC CATHETERIZATION  07/2003   no evidence of CAD  . COLONOSCOPY N/A 02/17/2017   Procedure: COLONOSCOPY;  Surgeon: Daneil Dolin, MD;  [facility];  Service: Endoscopy;  Laterality: N/A;  10:00am  . MASTECTOMY W/ SENTINEL NODE BIOPSY Left 02/18/2019   Procedure: LEFT MASTECTOMY WITH LEFT AXILLARY SENTINEL LYMPH NODE BIOPSY;  Surgeon: Alphonsa Overall, MD;  Location: [place];  Service: General;  Laterality: Left;  .  POLYPECTOMY  02/17/2017   Procedure: POLYPECTOMY;  Surgeon: Daneil Dolin, MD;  [facility];  Service: Endoscopy;;  colon  . removal cyst on chin    . TRANSTHORACIC ECHOCARDIOGRAM  03/2007   EF=>55%; borderline LA enlargement; MV mildly thickened/borderline MVP/mild MR    There were no vitals filed for this visit.  Subjective Assessment - 05/24/19 0805    Subjective  Pt states that she worked 7 days this week so she is tired. She states that her arm is doing well at work. Her skin is a little raw from radiation but she has no shoulder pain. Pt states that she has not yet received her pump and sleeve.    Pertinent History  Patient was diagnosed on 11/26/2018 with left grade II-III invasive lobular carcinoma breast cancer. It is ER/PR positive, HER2 negative, and a Ki67 of 10%. Patient underwent a left mastectomy and sentinel node biopsy on 02/18/2019. One of 5 lymph nodes was positive but her Mammaprint came back low risk so no chemo is needed. She will undergo axillary and chest radiation.    Patient Stated Goals  Get my arm moving better.    Currently in Pain?  No/denies    Pain Score  0-No pain    Pain Onset  More than a month ago         [facility] PT Assessment - 05/24/19 0001      AROM  Left Shoulder Flexion  142 Degrees    Left Shoulder ABduction  153 Degrees           Quick Dash - 05/24/19 0001    Open a tight or new jar  Mild difficulty    Do heavy household chores (wash walls, wash floors)  No difficulty    Carry a shopping bag or briefcase  No difficulty    Wash your back  No difficulty    Use a knife to cut food  No difficulty    Recreational activities in which you take some force or impact through your arm, shoulder, or hand (golf, hammering, tennis)  No difficulty    During the past week, to what extent has your arm, shoulder or hand problem interfered with your normal social activities with family, friends, neighbors, or groups?  Not at all    During the  past week, to what extent has your arm, shoulder or hand problem limited your work or other regular daily activities  Not at all    Arm, shoulder, or hand pain.  Mild    Tingling (pins and needles) in your arm, shoulder, or hand  None    Difficulty Sleeping  Mild difficulty    DASH Score  6.82 %             [facility] Adult PT Treatment/Exercise - 05/24/19 0001      Exercises   Exercises  Other Exercises    Other Exercises   Strength after breast cancer from page16, Supine chest press 3#, sit to stand10x w/demonstration slight lean toward the RLE difficulty improving with VC, standing bent over row 3# following demonstration R and L UE, Standing shoulder extension with VC for scapular squeeze at end range 3# R/L UE, Modified dead lift 3# each hand VC/tactile cueing and demonstration for tight transverse abdominus, Glute squeeze at the top and tactile cueing to prevnet Bil shoulder hiking on lift, 6" step up R/L LE following demonstration and 1x VC to decrease IR at the LLE; modified w/o foot lift on step, wgted heel raises 3# each hand VC for glute squeeze and demonstration 20x, Standing bicep curl 3# each hand VC to decrease shoulder elevation Bil,  Standing shoulder V at wall 2# each UE  VC to keep thumbs toward the ceiling not out laterally and demonstration for correct movement all activities except heel raises performed 10x      Manual Therapy   Manual Therapy  Manual Lymphatic Drainage (MLD);Soft tissue mobilization;Passive ROM    Soft tissue mobilization  Bil deltoids and upper trapezius; decreased minimally following light STM.     Manual Lymphatic Drainage (MLD)  Bil axillary and inguinal nodes, Inter-axillary anastomosis to the R and axillo-inguinal anastomosis on the L following STM    Passive ROM  PROM in all directions with visual on L incision site over reconstruction to monitor for tension; no tension noted throughout incision was in tact at end of session.              PT  Education - 05/24/19 [phone]    Education Details  Pt was educated on progress since her initial evaluation. Discussed her plan of care including her plan will be extended 1 month for 1 visit once she receives her vasopneumatic pump and compression sleeve to make sure that she is managing her lymphedema well at home and has the equipment that she needs for success with self management. Discussed risk reduction precautions and reasons to return  to the MD to come back to physical therapy for management of her lymphedema. She was educated on the purpose of the strength after breast cancer activities including this is just a starting point to get back to more physical activity and discussed the [employer] program as a resource to help her with her overall health and well being.    Person(s) Educated  Patient    Methods  Explanation;Demonstration;Tactile cues;Verbal cues;Handout    Comprehension  Verbalized understanding;Returned demonstration       PT Short Term Goals - 05/11/19 0814      PT SHORT TERM GOAL #1   Title  Pt will demonstrate 120 degrees flexion/abd within 3 weeks in order to demonstrate improvement of LUE functional ROM.    Baseline  131 degrees flexion, 132 degrees abdct 05/11/19    Status  Achieved        PT Long Term Goals - 05/24/19 0810      PT LONG TERM GOAL #1   Title  Patient will demonstrate she has regained shoulder ROM and function post operatively compared to baseline assessments.    Baseline  Pt has reached her pre-surgical goals +/- 1 degree in flexion/abduction    Status  Achieved      PT LONG TERM GOAL #2   Title  Patient will increase left shoulder active flexion to >/= 140 degrees for increased ease reaching overhead.    Baseline  98 degrees flexion 04/06/19, 131 degrees flexion 05/11/19, 142 degrees flexion 05/24/19    Status  Achieved      PT LONG TERM GOAL #3   Title  Patient will increase left shoulder active abduction to >/= 150 degrees for increased ease  reaching overhead.    Baseline  77 degrees 04/06/19, 132 degrees abdcution 05/11/2019, 153 degrees abduction L shoulder    Status  Achieved      PT LONG TERM GOAL #4   Title  Patient will decrease DASH score to </= 5 for improved overall shoulder and upper extremity function.    Baseline  63% arm disability 04/06/19, 9.09% disability 05/11/2019, 6.83% disability 05/24/19    Status  Partially Met      PT LONG TERM GOAL #5   Title  Patient will report she is able to return to her biking and other exercises for helping to reduce recurrence risk and general overall better health.    Baseline  Pt reports that she is riding her bike up to an hour which she was doing previously. She is back to work w/o difficulty    Status  Achieved      PT LONG TERM GOAL #6   Title  Patient will verbalize good understanding of lymphedema risk reduction practices.    Baseline  Pt is able to state reasons to return to the MD for an exacerbation of her lymphedema including increasd heaviness, pain or swelling as well as risk reduction practices such as wearing her sleeve, performing MLD and doing exercise.    Status  Achieved            Plan - 05/24/19 0804    Clinical Impression Statement  Pt was able to tolerate finishing the strength after breast cancer program today with mild modifications due to strength/balance deficits. She was able to talk this physical therapist through self MLD with demonstration of direction/skin stretch and sequencing with good recall. Palpable tightness/tenderness continues at Bil deltoids and upper trapezius; decreased mildly following light STM. Anterior trunk MLD performed following  STM in order to decrease risk for increased fluid build up. Pt ROM is improving in her LUE but continues with stiffness in her L shoulder compared to her R. She has started back and work and has no reports of increased pain or difficulty performing her assigned activities. Discussed POC and pt will return  1x/month to assess compressoin garment and use of vasopneumatic pump to ensure that she has appropriate eqiupment for autonomy of care.    Rehab Potential  Excellent    PT Frequency  Monthy    PT Duration  4 weeks    PT Treatment/Interventions  ADLs/Self Care Home Management;Therapeutic exercise;Patient/family education;Manual techniques;Manual lymph drainage;Therapeutic activities;Scar mobilization;Passive range of motion    PT Next Visit Plan  Sleeve/pump check    PT Home Exercise Plan  Access Code: [email or web address], perform [place] exercises at home, strenght after breast cancer exercises    Consulted and Agree with Plan of Care  Patient       Patient will benefit from skilled therapeutic intervention in order to improve the following deficits and impairments:  Decreased knowledge of precautions, Impaired UE functional use, Pain, Postural dysfunction, Decreased range of motion, Increased fascial restricitons  Visit Diagnosis: Abnormal posture  Stiffness of left shoulder, not elsewhere classified  Aftercare following surgery for neoplasm  Malignant neoplasm of overlapping sites of left breast in female, estrogen receptor positive ([place])     Problem List Patient Active Problem List   Diagnosis Date Noted  . Breast cancer, left breast ([place]) 02/18/2019  . Malignant neoplasm of overlapping sites of left breast in female, estrogen receptor positive ([place]) 12/17/2018  . Moderate obesity 03/13/2016  . Palpitations 03/06/2013  . Hyperlipidemia, mixed 03/06/2013  . Hypertension 02/14/2013  . Non-insulin treated type 2 diabetes mellitus ([place]) 02/14/2013    Ander Purpura, PT 05/24/2019, [place], [place], [address] Phone: [phone]   Fax:  [phone]  Name: CHANCI OJALA MRN: [id number] Date of Birth: September 12, 1960

## 2019-05-25 ENCOUNTER — Other Ambulatory Visit: Payer: Self-pay

## 2019-05-25 ENCOUNTER — Ambulatory Visit
Admission: RE | Admit: 2019-05-25 | Discharge: 2019-05-25 | Disposition: A | Discharge status: Home / Self Care | Payer: BC Managed Care – PPO | Source: Ambulatory Visit | Attending: Radiation Oncology | Admitting: Radiation Oncology

## 2019-05-25 DIAGNOSIS — Z170 Estrogen receptor positive status [ER+]: Secondary | ICD-10-CM | POA: Diagnosis not present

## 2019-05-25 DIAGNOSIS — C50812 Malignant neoplasm of overlapping sites of left female breast: Secondary | ICD-10-CM | POA: Diagnosis not present

## 2019-05-25 DIAGNOSIS — C50412 Malignant neoplasm of upper-outer quadrant of left female breast: Secondary | ICD-10-CM | POA: Diagnosis not present

## 2019-05-29 NOTE — Progress Notes (Signed)
Patient Care Team: Sharilyn Sites, MD as PCP - General (Family Medicine) Croitoru, Dani Gobble, MD as PCP - Cardiology (Cardiology) Mauro Kaufmann, RN as Oncology Nurse Navigator Rockwell Germany, RN as Oncology Nurse Navigator Alphonsa Overall, MD as Consulting Physician (General Surgery) Nicholas Lose, MD as Consulting Physician (Hematology and Oncology) Kyung Rudd, MD as Consulting Physician (Radiation Oncology) Irene Limbo, MD as Consulting Physician (Plastic Surgery)  DIAGNOSIS:    ICD-10-CM   1. Malignant neoplasm of overlapping sites of left breast in female, estrogen receptor positive ([place])  C50.812    Z17.0     SUMMARY OF ONCOLOGIC HISTORY: Oncology History  Malignant neoplasm of overlapping sites of left breast in female, estrogen receptor positive ([place])  12/17/2018 Initial Diagnosis   Screening mammogram detected 1.5cm left breast mass, left breast distortion, and cortical thickening in left axillary lymph node. Biopsy confirmed invasive mammary carcinoma with mammary carcinoma in situ, grade 2-3, HER2- (1+), ER+ (100%), PR+ (100%), Ki67 10%. No evidence of carcinoma in the left axillary lymph node.    12/22/2018 Cancer Staging   Staging form: Breast, AJCC 8th Edition - Clinical stage from 12/22/2018: Stage IA (cT1c, cN0, cM0, G3, ER+, PR+, HER2-) - Signed by Nicholas Lose, MD on 12/22/2018   12/22/2018 -  Anti-estrogen oral therapy   Neoadjuvant anastrozole   02/18/2019 Surgery   Left mastectomy and sentinel lymph node biopsy [name], [place]): invasive lobular carcinoma, grade 2, 1.6cm, clear margins. One lymph node positive for metastatic carcinoma, 0.5cm, with extranodal extension.    02/25/2019 Cancer Staging   Staging form: Breast, AJCC 8th Edition - Pathologic: Stage IA (pT1c, pN1a, cM0, G2, ER+, PR+, HER2-) - Signed by Nicholas Lose, MD on 02/25/2019   03/11/2019 Oncotype testing   Mammaprint: low risk, 10% of recurrence in 10 years without systemic therapy    04/20/2019 -  Radiation Therapy   Adjuvant radiation      CHIEF COMPLIANT: Follow-up of left breast cancer  INTERVAL HISTORY: Tracy Knight is a 58 y.o. with above-mentioned history of left breast cancer treated with neoadjuvant anastrozole therapy prior to undergoing a left mastectomy. She is currently on adjuvant anti-estrogen therapy with anastrozole and undergoing radiation. She presents to the clinic today for follow-up.   REVIEW OF SYSTEMS:   Constitutional: Denies fevers, chills or abnormal weight loss Eyes: Denies blurriness of vision Ears, nose, mouth, throat, and face: Denies mucositis or sore throat Respiratory: Denies cough, dyspnea or wheezes Cardiovascular: Denies palpitation, chest discomfort Gastrointestinal: Denies nausea, heartburn or change in bowel habits Skin: Denies abnormal skin rashes Lymphatics: Denies new lymphadenopathy or easy bruising Neurological: Denies numbness, tingling or new weaknesses Behavioral/Psych: Mood is stable, no new changes  Extremities: No lower extremity edema Breast: denies any pain or lumps or nodules in either breasts All other systems were reviewed with the patient and are negative.  I have reviewed the past medical history, past surgical history, social history and family history with the patient and they are unchanged from previous note.  ALLERGIES:  is allergic to carvedilol.  MEDICATIONS:  Current Outpatient Medications  Medication Sig Dispense Refill  . anastrozole (ARIMIDEX) 1 MG tablet Take 1 tablet (1 mg total) by mouth daily. 90 tablet 3  . cetirizine (ZYRTEC) 10 MG tablet Take 10 mg by mouth daily.    . cholecalciferol (VITAMIN D) 1000 UNITS tablet Take 1,000 Units by mouth 3 (three) times a week.     . Cyanocobalamin (VITAMIN B-12) 2500 MCG SUBL Place 2,500 mcg under  the tongue daily.    Marland Kitchen diltiazem (TIAZAC) 360 MG 24 hr capsule TAKE 1 CAPSULE BY MOUTH DAILY 90 capsule 2  . Ferrous Sulfate Dried (SLOW RELEASE IRON) 45  MG TBCR Take 45 mg by mouth daily.    . fish oil-omega-3 fatty acids 1000 MG capsule Take 1,000 mg by mouth daily.     Marland Kitchen guaiFENesin (MUCINEX) 600 MG 12 hr tablet Take 600 mg by mouth daily as needed (for congestion/cough).    Marland Kitchen ibuprofen (ADVIL,MOTRIN) 200 MG tablet Take 200-400 mg by mouth every 8 (eight) hours as needed (for pain/headache.).    Marland Kitchen levalbuterol (XOPENEX) 1.25 MG/3ML nebulizer solution Inhale 1.25 mg into the lungs every 6 (six) hours as needed. For wheezing/shortness of breath  3  . losartan (COZAAR) 50 MG tablet TAKE 1 TABLET(50 MG) BY MOUTH DAILY 90 tablet 0  . metFORMIN (GLUCOPHAGE) 1000 MG tablet Take 1,000 mg by mouth daily before lunch.    . methocarbamol (ROBAXIN) 500 MG tablet Take 1 tablet (500 mg total) by mouth every 8 (eight) hours as needed for muscle spasms. 30 tablet 0  . montelukast (SINGULAIR) 10 MG tablet Take 10 mg by mouth daily before lunch.     . oxyCODONE (ROXICODONE) 5 MG immediate release tablet Take 1 tablet (5 mg total) by mouth every 4 (four) hours as needed. 40 tablet 0  . pravastatin (PRAVACHOL) 40 MG tablet TAKE 1 TABLET(40 MG) BY MOUTH DAILY 90 tablet 3  . PROAIR HFA 108 (90 Base) MCG/ACT inhaler INHALE 2 PUFFS INTO THE LUNGS Q 4 H PRN  2  . simvastatin (ZOCOR) 20 MG tablet     . sulfamethoxazole-trimethoprim (BACTRIM DS) 800-160 MG tablet Take 1 tablet by mouth 2 (two) times daily. 12 tablet 0  . triamterene-hydrochlorothiazide (MAXZIDE-25) 37.5-25 MG tablet Take 1 tablet by mouth daily.     No current facility-administered medications for this visit.     PHYSICAL EXAMINATION: ECOG PERFORMANCE STATUS: 1 - Symptomatic but completely ambulatory  There were no vitals filed for this visit. There were no vitals filed for this visit.  GENERAL: alert, no distress and comfortable SKIN: skin color, texture, turgor are normal, no rashes or significant lesions EYES: normal, Conjunctiva are pink and non-injected, sclera clear OROPHARYNX: no exudate,  no erythema and lips, buccal mucosa, and tongue normal  NECK: supple, thyroid normal size, non-tender, without nodularity LYMPH: no palpable lymphadenopathy in the cervical, axillary or inguinal LUNGS: clear to auscultation and percussion with normal breathing effort HEART: regular rate & rhythm and no murmurs and no lower extremity edema ABDOMEN: abdomen soft, non-tender and normal bowel sounds MUSCULOSKELETAL: no cyanosis of digits and no clubbing  NEURO: alert & oriented x 3 with fluent speech, no focal motor/sensory deficits EXTREMITIES: No lower extremity edema  LABORATORY DATA:  I have reviewed the data as listed CMP Latest Ref Rng & Units 02/15/2019 12/22/2018 04/06/2013  Glucose 70 - 99 mg/dL 104(H) 119(H) 98  BUN 6 - 20 mg/dL 14 24(H) 19  Creatinine 0.44 - 1.00 mg/dL 0.84 1.01(H) 0.90  Sodium 135 - 145 mmol/L 140 141 140  Potassium 3.5 - 5.1 mmol/L 3.9 3.2(L) 3.8  Chloride 98 - 111 mmol/L 101 101 98  CO2 22 - 32 mmol/L 29 30 35(H)  Calcium 8.9 - 10.3 mg/dL 10.0 10.2 10.1  Total Protein 6.5 - 8.1 g/dL - 8.3(H) 7.4  Total Bilirubin 0.3 - 1.2 mg/dL - 0.8 0.8  Alkaline Phos 38 - 126 U/L - 69 61  AST 15 - 41 U/L - 19 19  ALT 0 - 44 U/L - 18 17    Lab Results  Component Value Date   WBC 7.0 12/22/2018   HGB 12.8 12/22/2018   HCT 40.1 12/22/2018   MCV 83.9 12/22/2018   PLT 226 12/22/2018   NEUTROABS 4.0 12/22/2018    ASSESSMENT & PLAN:  Malignant neoplasm of overlapping sites of left breast in female, estrogen receptor positive ([place]) 12/17/2018:Screening mammogram detected 1.5cm left breast mass, left breast distortion, and cortical thickening in left axillary lymph node. Biopsy confirmed invasive mammary carcinoma with mammary carcinoma in situ, grade 2-3, HER2- (1+), ER+ (100%), PR+ (100%), Ki67 10%. No evidence of carcinoma in the left axillary lymph node.T1CN0 stage Ia  Recommendations: 1.Mastectomy with sentinel lymph node biopsyfollowed by 2.  MammaPrint testing  to determine if chemotherapy would be of any benefit followed by 3. Adjuvant antiestrogen therapy ---------------------------------------------------------------------------------------------------------------------------------- 02/17/2019: Left mastectomy and sentinel lymph node biopsy [name], [place]): invasive lobular carcinoma, grade 2, 1.6cm, clear margins. One lymph node positive for metastatic carcinoma, 0.5cm, with extranodal extension. 1/5 LN  Recommendation: 1.  MammaPrint: Luminal type A, low risk, did not need chemotherapy 2.  Adjuvant radiation therapy because of lymph node positivity 04/20/2019- 3.  Followed by adjuvant antiestrogen therapy.(Anastrozole was started neoadjuvantly until the time of surgery.) ------------------------------------------------------------------------------------------------------------------------------------------------------------- Treatment plan: Adjuvant antiestrogen therapy with anastrozole. Patient tolerated anastrozole that was given preoperatively. Anastrozole toxicities: None  Return to clinic in 3 months for survivorship care plan visit     No orders of the defined types were placed in this encounter.  The patient has a good understanding of the overall plan. she agrees with it. she will call with any problems that may develop before the next visit here.  Nicholas Lose, MD 05/30/2019  Julious Oka Dorshimer, am acting as scribe for Dr. Nicholas Lose.  I have reviewed the above documentation for accuracy and completeness, and I agree with the above.

## 2019-05-30 ENCOUNTER — Other Ambulatory Visit: Payer: Self-pay

## 2019-05-30 ENCOUNTER — Ambulatory Visit: Payer: BC Managed Care – PPO

## 2019-05-30 ENCOUNTER — Inpatient Hospital Stay: Payer: BC Managed Care – PPO | Attending: Hematology and Oncology | Admitting: Hematology and Oncology

## 2019-05-30 ENCOUNTER — Telehealth: Payer: Self-pay | Admitting: Hematology and Oncology

## 2019-05-30 ENCOUNTER — Ambulatory Visit
Admission: RE | Admit: 2019-05-30 | Discharge: 2019-05-30 | Disposition: A | Discharge status: Home / Self Care | Payer: BC Managed Care – PPO | Source: Ambulatory Visit | Attending: Radiation Oncology | Admitting: Radiation Oncology

## 2019-05-30 DIAGNOSIS — Z923 Personal history of irradiation: Secondary | ICD-10-CM | POA: Insufficient documentation

## 2019-05-30 DIAGNOSIS — Z79811 Long term (current) use of aromatase inhibitors: Secondary | ICD-10-CM | POA: Diagnosis not present

## 2019-05-30 DIAGNOSIS — Z791 Long term (current) use of non-steroidal anti-inflammatories (NSAID): Secondary | ICD-10-CM | POA: Diagnosis not present

## 2019-05-30 DIAGNOSIS — Z9012 Acquired absence of left breast and nipple: Secondary | ICD-10-CM | POA: Insufficient documentation

## 2019-05-30 DIAGNOSIS — Z79899 Other long term (current) drug therapy: Secondary | ICD-10-CM | POA: Insufficient documentation

## 2019-05-30 DIAGNOSIS — Z7984 Long term (current) use of oral hypoglycemic drugs: Secondary | ICD-10-CM | POA: Diagnosis not present

## 2019-05-30 DIAGNOSIS — C50812 Malignant neoplasm of overlapping sites of left female breast: Secondary | ICD-10-CM | POA: Insufficient documentation

## 2019-05-30 DIAGNOSIS — C50412 Malignant neoplasm of upper-outer quadrant of left female breast: Secondary | ICD-10-CM | POA: Diagnosis not present

## 2019-05-30 DIAGNOSIS — Z170 Estrogen receptor positive status [ER+]: Secondary | ICD-10-CM | POA: Insufficient documentation

## 2019-05-30 NOTE — Assessment & Plan Note (Signed)
12/17/2018:Screening mammogram detected 1.5cm left breast mass, left breast distortion, and cortical thickening in left axillary lymph node. Biopsy confirmed invasive mammary carcinoma with mammary carcinoma in situ, grade 2-3, HER2- (1+), ER+ (100%), PR+ (100%), Ki67 10%. No evidence of carcinoma in the left axillary lymph node.T1CN0 stage Ia  Recommendations: 1.Mastectomy with sentinel lymph node biopsyfollowed by 2.  MammaPrint testing to determine if chemotherapy would be of any benefit followed by 3. Adjuvant antiestrogen therapy ---------------------------------------------------------------------------------------------------------------------------------- 02/17/2019: Left mastectomy and sentinel lymph node biopsy [name], [place]): invasive lobular carcinoma, grade 2, 1.6cm, clear margins. One lymph node positive for metastatic carcinoma, 0.5cm, with extranodal extension. 1/5 LN  Recommendation: 1.  MammaPrint: Luminal type A, low risk, did not need chemotherapy 2.  Adjuvant radiation therapy because of lymph node positivity 04/20/2019- 3.  Followed by adjuvant antiestrogen therapy.(Anastrozole was started neoadjuvantly until the time of surgery.) ------------------------------------------------------------------------------------------------------------------------------------------------------------- Treatment plan: Adjuvant antiestrogen therapy with anastrozole. Patient tolerated anastrozole that was given preoperatively. Anastrozole toxicities: None  Return to clinic in 3 months for survivorship care plan visit

## 2019-05-30 NOTE — Telephone Encounter (Signed)
I left a message regarding video visit  °

## 2019-05-31 ENCOUNTER — Other Ambulatory Visit: Payer: Self-pay

## 2019-05-31 ENCOUNTER — Ambulatory Visit
Admission: RE | Admit: 2019-05-31 | Discharge: 2019-05-31 | Disposition: A | Discharge status: Home / Self Care | Payer: BC Managed Care – PPO | Source: Ambulatory Visit | Attending: Radiation Oncology | Admitting: Radiation Oncology

## 2019-05-31 ENCOUNTER — Ambulatory Visit: Payer: BC Managed Care – PPO

## 2019-05-31 DIAGNOSIS — C50812 Malignant neoplasm of overlapping sites of left female breast: Secondary | ICD-10-CM | POA: Insufficient documentation

## 2019-05-31 DIAGNOSIS — C50412 Malignant neoplasm of upper-outer quadrant of left female breast: Secondary | ICD-10-CM | POA: Diagnosis not present

## 2019-05-31 DIAGNOSIS — Z170 Estrogen receptor positive status [ER+]: Secondary | ICD-10-CM | POA: Insufficient documentation

## 2019-06-01 ENCOUNTER — Ambulatory Visit
Admission: RE | Admit: 2019-06-01 | Discharge: 2019-06-01 | Disposition: A | Discharge status: Home / Self Care | Payer: BC Managed Care – PPO | Source: Ambulatory Visit | Attending: Radiation Oncology | Admitting: Radiation Oncology

## 2019-06-01 ENCOUNTER — Other Ambulatory Visit: Payer: Self-pay

## 2019-06-01 ENCOUNTER — Ambulatory Visit: Payer: BC Managed Care – PPO

## 2019-06-01 DIAGNOSIS — C50412 Malignant neoplasm of upper-outer quadrant of left female breast: Secondary | ICD-10-CM | POA: Diagnosis not present

## 2019-06-01 DIAGNOSIS — Z170 Estrogen receptor positive status [ER+]: Secondary | ICD-10-CM | POA: Diagnosis not present

## 2019-06-01 DIAGNOSIS — C50812 Malignant neoplasm of overlapping sites of left female breast: Secondary | ICD-10-CM | POA: Diagnosis not present

## 2019-06-02 ENCOUNTER — Other Ambulatory Visit: Payer: Self-pay

## 2019-06-02 ENCOUNTER — Ambulatory Visit
Admission: RE | Admit: 2019-06-02 | Discharge: 2019-06-02 | Disposition: A | Discharge status: Home / Self Care | Payer: BC Managed Care – PPO | Source: Ambulatory Visit | Attending: Radiation Oncology | Admitting: Radiation Oncology

## 2019-06-02 DIAGNOSIS — C50412 Malignant neoplasm of upper-outer quadrant of left female breast: Secondary | ICD-10-CM | POA: Diagnosis not present

## 2019-06-02 DIAGNOSIS — C50812 Malignant neoplasm of overlapping sites of left female breast: Secondary | ICD-10-CM | POA: Diagnosis not present

## 2019-06-02 DIAGNOSIS — Z170 Estrogen receptor positive status [ER+]: Secondary | ICD-10-CM | POA: Diagnosis not present

## 2019-06-03 ENCOUNTER — Other Ambulatory Visit: Payer: Self-pay

## 2019-06-03 ENCOUNTER — Ambulatory Visit
Admission: RE | Admit: 2019-06-03 | Discharge: 2019-06-03 | Disposition: A | Discharge status: Home / Self Care | Payer: BC Managed Care – PPO | Source: Ambulatory Visit | Attending: Radiation Oncology | Admitting: Radiation Oncology

## 2019-06-03 DIAGNOSIS — C50812 Malignant neoplasm of overlapping sites of left female breast: Secondary | ICD-10-CM | POA: Diagnosis not present

## 2019-06-03 DIAGNOSIS — Z170 Estrogen receptor positive status [ER+]: Secondary | ICD-10-CM | POA: Diagnosis not present

## 2019-06-03 DIAGNOSIS — C50412 Malignant neoplasm of upper-outer quadrant of left female breast: Secondary | ICD-10-CM | POA: Diagnosis not present

## 2019-06-06 ENCOUNTER — Ambulatory Visit: Payer: BC Managed Care – PPO

## 2019-06-06 ENCOUNTER — Encounter: Payer: Self-pay | Admitting: *Deleted

## 2019-06-06 ENCOUNTER — Ambulatory Visit
Admission: RE | Admit: 2019-06-06 | Discharge: 2019-06-06 | Disposition: A | Discharge status: Home / Self Care | Payer: BC Managed Care – PPO | Source: Ambulatory Visit | Attending: Radiation Oncology | Admitting: Radiation Oncology

## 2019-06-06 ENCOUNTER — Other Ambulatory Visit: Payer: Self-pay

## 2019-06-06 ENCOUNTER — Encounter: Payer: Self-pay | Admitting: Radiation Oncology

## 2019-06-06 DIAGNOSIS — C50812 Malignant neoplasm of overlapping sites of left female breast: Secondary | ICD-10-CM | POA: Diagnosis not present

## 2019-06-06 DIAGNOSIS — C50912 Malignant neoplasm of unspecified site of left female breast: Secondary | ICD-10-CM | POA: Diagnosis not present

## 2019-06-06 DIAGNOSIS — C50412 Malignant neoplasm of upper-outer quadrant of left female breast: Secondary | ICD-10-CM | POA: Diagnosis not present

## 2019-06-06 DIAGNOSIS — Z170 Estrogen receptor positive status [ER+]: Secondary | ICD-10-CM | POA: Diagnosis not present

## 2019-06-07 ENCOUNTER — Ambulatory Visit: Payer: BC Managed Care – PPO

## 2019-06-09 ENCOUNTER — Encounter: Payer: Self-pay | Admitting: Cardiovascular Disease

## 2019-06-09 ENCOUNTER — Other Ambulatory Visit: Payer: Self-pay

## 2019-06-09 ENCOUNTER — Ambulatory Visit (INDEPENDENT_AMBULATORY_CARE_PROVIDER_SITE_OTHER): Payer: BC Managed Care – PPO | Admitting: Cardiovascular Disease

## 2019-06-09 VITALS — BP 150/88 | HR 84 | Temp 97.1°F | Ht 65.0 in | Wt 203.4 lb

## 2019-06-09 DIAGNOSIS — E7800 Pure hypercholesterolemia, unspecified: Secondary | ICD-10-CM

## 2019-06-09 DIAGNOSIS — I10 Essential (primary) hypertension: Secondary | ICD-10-CM

## 2019-06-09 DIAGNOSIS — R002 Palpitations: Secondary | ICD-10-CM

## 2019-06-09 DIAGNOSIS — E1169 Type 2 diabetes mellitus with other specified complication: Secondary | ICD-10-CM | POA: Diagnosis not present

## 2019-06-09 DIAGNOSIS — E669 Obesity, unspecified: Secondary | ICD-10-CM

## 2019-06-09 MED ORDER — LOSARTAN POTASSIUM 100 MG PO TABS: 100.0000 mg | ORAL_TABLET | Freq: Every day | ORAL | 3 refills | Status: DC

## 2019-06-09 NOTE — Patient Instructions (Signed)
Medication Instructions:  INCREASE the Losartan to 100 mg once daily STOP the Simvastatin  *If you need a refill on your cardiac medications before your next appointment, please call your pharmacy*  Lab Work: None ordered If you have labs (blood work) drawn today and your tests are completely normal, you will receive your results only by: Marland Kitchen MyChart Message (if you have MyChart) OR . A paper copy in the mail If you have any lab test that is abnormal or we need to change your treatment, we will call you to review the results.  Testing/Procedures: None ordered  Follow-Up: At [facility], you and your health needs are our priority.  As part of our continuing mission to provide you with exceptional heart care, we have created designated Provider Care Teams.  These Care Teams include your primary Cardiologist (physician) and Advanced Practice Providers (APPs -  Physician Assistants and Nurse Practitioners) who all work together to provide you with the care you need, when you need it.  Your next appointment:   12 month(s)  The format for your next appointment:   In Person  Provider:   Sanda Klein, MD  Other Instructions Dr. Sallyanne Kuster would like you to check your blood pressure DAILY for the next 4 weeks.  Keep a journal of these daily BP and heart rate readings and call our office or send a message through [facility] with the results (after Christmas).

## 2019-06-09 NOTE — Progress Notes (Signed)
Cardiology Office Note    Date:  06/09/2019   ID:  Tracy, Knight 07-10-1960, MRN [id number]  PCP:  Sharilyn Sites, MD  Cardiologist:   Sanda Klein, MD   Chief Complaint  Patient presents with  . Palpitations  . Hypertension    History of Present Illness:  Tracy Knight is a 58 y.o. female with obesity, hyperlipidemia, type 2 diabetes mellitus, hypertension and palpitations here for routine follow-up.   She has just completed radiation therapy for left breast cancer.  Due to treatment for breast cancer and the coronavirus pandemic she has been unable to exercise regularly.  She used to go to water aerobics.  She is doing some weight.  Her blood pressure has been consistently elevated.  She has occasional ankle swelling in the evenings that resolves by the next day.  Her palpitations are very well controlled.  Recent lipid profile showed excellent parameters with an LDL of 54 and HDL of 70, but she is taking both simvastatin and pravastatin.  Her most recent hemoglobin A1c was 6.7% on metformin monotherapy.  The patient specifically denies any chest pain at rest exertion, dyspnea at rest or with exertion, orthopnea, paroxysmal nocturnal dyspnea, syncope, palpitations, focal neurological deficits, intermittent claudication, lower extremity edema, unexplained weight gain, cough, hemoptysis or wheezing.  She had a remote cardiac catheterization in 2005 with normal coronary arteries. Her echo shows normal left ventricular ejection fraction, borderline left atrial enlargement and borderline criteria for mitral valve prolapse, mild mitral insufficiency. Past treatment with carvedilol caused problems with wheezing and asthma    Past Medical History:  Diagnosis Date  . Asthma   . Cancer ([place]) 11/2018   left breast cancer  . Diabetes mellitus without complication ([place])   . Dyslipidemia   . History of nuclear stress test 02/2009   exercise; normal pattern of perfusion; no significant  ischemia demonstrated; low risk scan   . Hypertension     Past Surgical History:  Procedure Laterality Date  . BREAST RECONSTRUCTION WITH PLACEMENT OF TISSUE EXPANDER AND ALLODERM Left 02/18/2019   Procedure: LEFT BREAST RECONSTRUCTION WITH PLACEMENT OF TISSUE EXPANDER AND ALLODERM;  Surgeon: Irene Limbo, MD;  Location: [place];  Service: Plastics;  Laterality: Left;  . CARDIAC CATHETERIZATION  07/2003   no evidence of CAD  . COLONOSCOPY N/A 02/17/2017   Procedure: COLONOSCOPY;  Surgeon: Daneil Dolin, MD;  [facility];  Service: Endoscopy;  Laterality: N/A;  10:00am  . MASTECTOMY W/ SENTINEL NODE BIOPSY Left 02/18/2019   Procedure: LEFT MASTECTOMY WITH LEFT AXILLARY SENTINEL LYMPH NODE BIOPSY;  Surgeon: Alphonsa Overall, MD;  Location: [place];  Service: General;  Laterality: Left;  . POLYPECTOMY  02/17/2017   Procedure: POLYPECTOMY;  Surgeon: Daneil Dolin, MD;  [facility];  Service: Endoscopy;;  colon  . removal cyst on chin    . TRANSTHORACIC ECHOCARDIOGRAM  03/2007   EF=>55%; borderline LA enlargement; MV mildly thickened/borderline MVP/mild MR    Current Medications: Outpatient Medications Prior to Visit  Medication Sig Dispense Refill  . anastrozole (ARIMIDEX) 1 MG tablet Take 1 tablet (1 mg total) by mouth daily. 90 tablet 3  . cetirizine (ZYRTEC) 10 MG tablet Take 10 mg by mouth daily.    . cholecalciferol (VITAMIN D) 1000 UNITS tablet Take 1,000 Units by mouth 3 (three) times a week.     . Cyanocobalamin (VITAMIN B-12) 2500 MCG SUBL Place 2,500 mcg under the tongue daily.    Marland Kitchen  diltiazem (TIAZAC) 360 MG 24 hr capsule TAKE 1 CAPSULE BY MOUTH DAILY 90 capsule 2  . Ferrous Sulfate Dried (SLOW RELEASE IRON) 45 MG TBCR Take 45 mg by mouth daily.    . fish oil-omega-3 fatty acids 1000 MG capsule Take 1,000 mg by mouth daily.     Marland Kitchen guaiFENesin (MUCINEX) 600 MG 12 hr tablet Take 600 mg by mouth daily as needed (for  congestion/cough).    Marland Kitchen ibuprofen (ADVIL,MOTRIN) 200 MG tablet Take 200-400 mg by mouth every 8 (eight) hours as needed (for pain/headache.).    Marland Kitchen levalbuterol (XOPENEX) 1.25 MG/3ML nebulizer solution Inhale 1.25 mg into the lungs every 6 (six) hours as needed. For wheezing/shortness of breath  3  . metFORMIN (GLUCOPHAGE) 1000 MG tablet Take 1,000 mg by mouth daily before lunch.    . montelukast (SINGULAIR) 10 MG tablet Take 10 mg by mouth daily before lunch.     . pravastatin (PRAVACHOL) 40 MG tablet TAKE 1 TABLET(40 MG) BY MOUTH DAILY 90 tablet 3  . PROAIR HFA 108 (90 Base) MCG/ACT inhaler INHALE 2 PUFFS INTO THE LUNGS Q 4 H PRN  2  . triamterene-hydrochlorothiazide (MAXZIDE-25) 37.5-25 MG tablet Take 1 tablet by mouth daily.    Marland Kitchen losartan (COZAAR) 50 MG tablet TAKE 1 TABLET(50 MG) BY MOUTH DAILY 90 tablet 0  . simvastatin (ZOCOR) 20 MG tablet      No facility-administered medications prior to visit.     Allergies:   Carvedilol   Social History   Socioeconomic History  . Marital status: Married    Spouse name: Not on file  . Number of children: Not on file  . Years of education: Not on file  . Highest education level: Not on file  Occupational History  . Not on file  Tobacco Use  . Smoking status: Never Smoker  . Smokeless tobacco: Never Used  . Tobacco comment: never smoke.  Substance and Sexual Activity  . Alcohol use: No  . Drug use: No  . Sexual activity: Yes    Birth control/protection: Post-menopausal  Other Topics Concern  . Not on file  Social History Narrative  . Not on file   Social Determinants of Health   Financial Resource Strain:   . Difficulty of Paying Living Expenses: Not on file  Food Insecurity:   . Worried About Charity fundraiser in the Last Year: Not on file  . Ran Out of Food in the Last Year: Not on file  Transportation Needs:   . Lack of Transportation (Medical): Not on file  . Lack of Transportation (Non-Medical): Not on file  Physical  Activity:   . Days of Exercise per Week: Not on file  . Minutes of Exercise per Session: Not on file  Stress:   . Feeling of Stress : Not on file  Social Connections:   . Frequency of Communication with Friends and Family: Not on file  . Frequency of Social Gatherings with Friends and Family: Not on file  . Attends Religious Services: Not on file  . Active Member of Clubs or Organizations: Not on file  . Attends Archivist Meetings: Not on file  . Marital Status: Not on file     Family History:  The patient's family history includes Diabetes in her daughter; Heart disease in her father and mother; Hypertension in her daughter and sister.   ROS:   Please see the history of present illness.    ROS All other systems are reviewed  and are negative.   PHYSICAL EXAM:   VS:  BP (!) 150/88   Pulse 84   Temp (!) 97.1 F (36.2 C)   Ht 5\' 5"  (1.651 m)   Wt 203 lb 6.4 oz (92.3 kg)   SpO2 98%   BMI 33.85 kg/m     General: Alert, oriented x3, no distress, obese Head: no evidence of trauma, PERRL, EOMI, no exophtalmos or lid lag, no myxedema, no xanthelasma; normal ears, nose and oropharynx Neck: normal jugular venous pulsations and no hepatojugular reflux; brisk carotid pulses without delay and no carotid bruits Chest: clear to auscultation, no signs of consolidation by percussion or palpation, normal fremitus, symmetrical and full respiratory excursions Cardiovascular: normal position and quality of the apical impulse, regular rhythm, normal first and second heart sounds, no murmurs, rubs or gallops Abdomen: no tenderness or distention, no masses by palpation, no abnormal pulsatility or arterial bruits, normal bowel sounds, no hepatosplenomegaly Extremities: no clubbing, cyanosis or edema; 2+ radial, ulnar and brachial pulses bilaterally; 2+ right femoral, posterior tibial and dorsalis pedis pulses; 2+ left femoral, posterior tibial and dorsalis pedis pulses; no subclavian or  femoral bruits Neurological: grossly nonfocal Psych: Normal mood and affect there is some skin hyperpigmentation following XRT, but no skin breakdown.   Wt Readings from Last 3 Encounters:  06/09/19 203 lb 6.4 oz (92.3 kg)  05/30/19 204 lb 8 oz (92.8 kg)  02/25/19 196 lb 8 oz (89.1 kg)      Studies/Labs Reviewed:   EKG:  EKG is ordered today.  It shows normal sinus rhythm and minor nonspecific T wave changes Recent Labs: Recent labs from Dr. Hilma Favors (from Nov 03, 2018) showed hemoglobin A1c 6.7 %, total cholesterol 139, HDL 70, LDL 54, triglycerides 75 Creatinine 0.84, hemoglobin 12.8, normal liver function tests, TSH 2.25 Lipid Panel    Component Value Date/Time   CHOL 167 04/06/2013 1049   TRIG 52 04/06/2013 1049   HDL 72 04/06/2013 1049   CHOLHDL 2.3 04/06/2013 1049   VLDL 10 04/06/2013 1049   LDLCALC 85 04/06/2013 1049     ASSESSMENT:    1. Palpitations   2. Essential hypertension   3. Diabetes mellitus type 2 in obese ([place])   4. Mild obesity   5. Hypercholesterolemia      PLAN:  In order of problems listed above:  1. Palpitations: Well-controlled on diltiazem.  She had wheezing with carvedilol, but we have not tried more selective beta-blockers. 2. HTN: Not well controlled.  She has been less physically active and has gained some weight.  Increase losartan to 100 mg daily. 3. DM: adequate control on Metformin. 4. Obesity: Now that she has completed radiation therapy she is committed to more exercise.  She has both a treadmill and a stationary bicycle at home. 5. HLP: Excellent lipid parameters.  She should not be taking to statins.  We will stop the simvastatin due to its potential interaction with diltiazem.  Continue pravastatin same dose.    Medication Adjustments/Labs and Tests Ordered: Current medicines are reviewed at length with the patient today.  Concerns regarding medicines are outlined above.  Medication changes, Labs and Tests ordered today are  listed in the Patient Instructions below. Patient Instructions  Medication Instructions:  INCREASE the Losartan to 100 mg once daily STOP the Simvastatin  *If you need a refill on your cardiac medications before your next appointment, please call your pharmacy*  Lab Work: None ordered If you have labs (blood work) drawn today  and your tests are completely normal, you will receive your results only by: Marland Kitchen MyChart Message (if you have MyChart) OR . A paper copy in the mail If you have any lab test that is abnormal or we need to change your treatment, we will call you to review the results.  Testing/Procedures: None ordered  Follow-Up: At [facility], you and your health needs are our priority.  As part of our continuing mission to provide you with exceptional heart care, we have created designated Provider Care Teams.  These Care Teams include your primary Cardiologist (physician) and Advanced Practice Providers (APPs -  Physician Assistants and Nurse Practitioners) who all work together to provide you with the care you need, when you need it.  Your next appointment:   12 month(s)  The format for your next appointment:   In Person  Provider:   Sanda Klein, MD  Other Instructions Dr. Sallyanne Kuster would like you to check your blood pressure DAILY for the next 4 weeks.  Keep a journal of these daily BP and heart rate readings and call our office or send a message through [place] with the results (after Christmas).       Signed, Sanda Klein, MD  06/09/2019 9:44 AM    [facility] [place], [place], [place]  [address] Phone: [phone]; Fax: [phone]

## 2019-06-13 DIAGNOSIS — C50812 Malignant neoplasm of overlapping sites of left female breast: Secondary | ICD-10-CM | POA: Diagnosis not present

## 2019-06-13 DIAGNOSIS — Z923 Personal history of irradiation: Secondary | ICD-10-CM | POA: Diagnosis not present

## 2019-06-13 DIAGNOSIS — Z9012 Acquired absence of left breast and nipple: Secondary | ICD-10-CM | POA: Diagnosis not present

## 2019-06-13 DIAGNOSIS — Z170 Estrogen receptor positive status [ER+]: Secondary | ICD-10-CM | POA: Diagnosis not present

## 2019-06-14 DIAGNOSIS — C50912 Malignant neoplasm of unspecified site of left female breast: Secondary | ICD-10-CM | POA: Diagnosis not present

## 2019-06-17 DIAGNOSIS — I890 Lymphedema, not elsewhere classified: Secondary | ICD-10-CM | POA: Diagnosis not present

## 2019-06-21 DIAGNOSIS — E119 Type 2 diabetes mellitus without complications: Secondary | ICD-10-CM | POA: Diagnosis not present

## 2019-06-21 DIAGNOSIS — I10 Essential (primary) hypertension: Secondary | ICD-10-CM | POA: Diagnosis not present

## 2019-06-21 DIAGNOSIS — E6609 Other obesity due to excess calories: Secondary | ICD-10-CM | POA: Diagnosis not present

## 2019-06-21 DIAGNOSIS — J4521 Mild intermittent asthma with (acute) exacerbation: Secondary | ICD-10-CM | POA: Diagnosis not present

## 2019-06-21 DIAGNOSIS — E785 Hyperlipidemia, unspecified: Secondary | ICD-10-CM | POA: Diagnosis not present

## 2019-06-21 DIAGNOSIS — Z6834 Body mass index (BMI) 34.0-34.9, adult: Secondary | ICD-10-CM | POA: Diagnosis not present

## 2019-06-21 DIAGNOSIS — M1991 Primary osteoarthritis, unspecified site: Secondary | ICD-10-CM | POA: Diagnosis not present

## 2019-06-27 ENCOUNTER — Other Ambulatory Visit: Payer: Self-pay

## 2019-06-27 MED ORDER — PRAVASTATIN SODIUM 40 MG PO TABS: ORAL_TABLET | ORAL | 3 refills | Status: DC

## 2019-06-30 ENCOUNTER — Encounter: Payer: Self-pay | Admitting: *Deleted

## 2019-07-04 ENCOUNTER — Other Ambulatory Visit: Payer: Self-pay

## 2019-07-04 ENCOUNTER — Ambulatory Visit: Payer: BC Managed Care – PPO | Attending: Surgery

## 2019-07-04 DIAGNOSIS — Z170 Estrogen receptor positive status [ER+]: Secondary | ICD-10-CM | POA: Diagnosis not present

## 2019-07-04 DIAGNOSIS — M25612 Stiffness of left shoulder, not elsewhere classified: Secondary | ICD-10-CM | POA: Diagnosis not present

## 2019-07-04 DIAGNOSIS — R293 Abnormal posture: Secondary | ICD-10-CM | POA: Diagnosis not present

## 2019-07-04 DIAGNOSIS — Z483 Aftercare following surgery for neoplasm: Secondary | ICD-10-CM

## 2019-07-04 DIAGNOSIS — C50812 Malignant neoplasm of overlapping sites of left female breast: Secondary | ICD-10-CM

## 2019-07-04 NOTE — Therapy (Signed)
[place], [place], [address] Phone: [phone]   Fax:  [phone]  Physical Therapy Discharge Note  Patient Details  Name: Tracy Knight MRN: [id number] Date of Birth: 02-07-1961 Referring Provider (PT): Dr. Alphonsa Overall   Encounter Date: 07/04/2019  PT End of Session - 07/04/19 0809    Visit Number  18    Number of Visits  20    Date for PT Re-Evaluation  06/28/19    PT Start Time  0804    PT Stop Time  0830    PT Time Calculation (min)  26 min    Activity Tolerance  Patient tolerated treatment well    Behavior During Therapy  [facility] for tasks assessed/performed       Past Medical History:  Diagnosis Date  . Asthma   . Cancer ([place]) 11/2018   left breast cancer  . Diabetes mellitus without complication ([place])   . Dyslipidemia   . History of nuclear stress test 02/2009   exercise; normal pattern of perfusion; no significant ischemia demonstrated; low risk scan   . Hypertension     Past Surgical History:  Procedure Laterality Date  . BREAST RECONSTRUCTION WITH PLACEMENT OF TISSUE EXPANDER AND ALLODERM Left 02/18/2019   Procedure: LEFT BREAST RECONSTRUCTION WITH PLACEMENT OF TISSUE EXPANDER AND ALLODERM;  Surgeon: Irene Limbo, MD;  Location: [place];  Service: Plastics;  Laterality: Left;  . CARDIAC CATHETERIZATION  07/2003   no evidence of CAD  . COLONOSCOPY N/A 02/17/2017   Procedure: COLONOSCOPY;  Surgeon: Daneil Dolin, MD;  [facility];  Service: Endoscopy;  Laterality: N/A;  10:00am  . MASTECTOMY W/ SENTINEL NODE BIOPSY Left 02/18/2019   Procedure: LEFT MASTECTOMY WITH LEFT AXILLARY SENTINEL LYMPH NODE BIOPSY;  Surgeon: Alphonsa Overall, MD;  Location: [place];  Service: General;  Laterality: Left;  . POLYPECTOMY  02/17/2017   Procedure: POLYPECTOMY;  Surgeon: Daneil Dolin, MD;  [facility];  Service: Endoscopy;;  colon  . removal  cyst on chin    . TRANSTHORACIC ECHOCARDIOGRAM  03/2007   EF=>55%; borderline LA enlargement; MV mildly thickened/borderline MVP/mild MR    There were no vitals filed for this visit.  Subjective Assessment - 07/04/19 0810    Subjective  Pt reports that she has recieved her vasopneumatic pump and that she has been using it every day. She states that she has also received her sleeve and is wearing it when she works. She states that she is doing her exercises but not every day. Pt denies any pain and states that overall she is confident in managing her lymphedema at home.    Pertinent History  Patient was diagnosed on 11/26/2018 with left grade II-III invasive lobular carcinoma breast cancer. It is ER/PR positive, HER2 negative, and a Ki67 of 10%. Patient underwent a left mastectomy and sentinel node biopsy on 02/18/2019. One of 5 lymph nodes was positive but her Mammaprint came back low risk so no chemo is needed. She will undergo axillary and chest radiation.    Patient Stated Goals  Get my arm moving better.    Currently in Pain?  No/denies    Pain Score  0-No pain         [facility] PT Assessment - 07/04/19 0001      AROM   Right Shoulder Flexion  156 Degrees    Right Shoulder ABduction  166 Degrees    Right Shoulder Internal  Rotation  73 Degrees    Right Shoulder External Rotation  78 Degrees    Left Shoulder Flexion  148 Degrees    Left Shoulder ABduction  157 Degrees    Left Shoulder Internal Rotation  68 Degrees    Left Shoulder External Rotation  75 Degrees        LYMPHEDEMA/ONCOLOGY QUESTIONNAIRE - 07/04/19 0817      Left Upper Extremity Lymphedema   15 cm Proximal to Olecranon Process  38 cm    10 cm Proximal to Olecranon Process  36 cm    Olecranon Process  27.8 cm    15 cm Proximal to Ulnar Styloid Process  27 cm    10 cm Proximal to Ulnar Styloid Process  24 cm    Just Proximal to Ulnar Styloid Process  17 cm    Across Hand at [employer]  20 cm    At [place] of 2nd  Digit  6.5 cm        Quick Dash - 07/04/19 0001    Open a tight or new jar  Mild difficulty    Do heavy household chores (wash walls, wash floors)  No difficulty    Carry a shopping bag or briefcase  No difficulty    Wash your back  No difficulty    Use a knife to cut food  No difficulty    Recreational activities in which you take some force or impact through your arm, shoulder, or hand (golf, hammering, tennis)  No difficulty    During the past week, to what extent has your arm, shoulder or hand problem interfered with your normal social activities with family, friends, neighbors, or groups?  Not at all    During the past week, to what extent has your arm, shoulder or hand problem limited your work or other regular daily activities  Not at all    Arm, shoulder, or hand pain.  None    Tingling (pins and needles) in your arm, shoulder, or hand  None    Difficulty Sleeping  No difficulty    DASH Score  2.27 %                     PT Education - 07/04/19 0831    Education Details  Pt was educated today on reasons to return to MD for physical therapy referral including heaviness, aching or increased edema in the LUE. Discussed ways to check for edema in her L hand including compring the L hand to the R hand. Re-iterated education on management of lymphedema at home including wearing her sleeve at work or while working out. Discussed using her pump daily. Pt was educated if she does notice an increase in symptoms to wear her sleeve daily and to continue with pump if this does not alleviate symptoms she will contact MD. Pt was educated on performing exercises at least 3x/week and to continue stretching her L shoulder due to she continues to have decreased ROM in her L shoulder compared to her R. She was educated on the importance of easy stretching with hold into light stretch for improved ROM. Pt was educated on progress since last visit and discussed discharge today.    Person(s)  Educated  Patient    Methods  Explanation    Comprehension  Verbalized understanding       PT Short Term Goals - 05/11/19 0814      PT SHORT TERM GOAL #1   Title  Pt will demonstrate 120 degrees flexion/abd within 3 weeks in order to demonstrate improvement of LUE functional ROM.    Baseline  131 degrees flexion, 132 degrees abdct 05/11/19    Status  Achieved        PT Long Term Goals - 07/04/19 0840      PT LONG TERM GOAL #4   Title  Patient will decrease DASH score to </= 5 for improved overall shoulder and upper extremity function.    Baseline  2.26% today 07/04/19    Status  Achieved            Plan - 07/04/19 0809    Clinical Impression Statement  Pt returns to physical therapy after 1 month of self management at home. She has received her compression garments and vasopneumatic pump at this time. She states that she is using her pump daily and is wearing her compression garments at work. Pt L shoulder ROM has improved since her last visit as well as her circumferential measurements have decreased significantly. Discussed the fluctuating nature of lymphedema and how sometimes fluid will be up, sometimes down and how to differentiate between when to manage at home and when to call MD to return to physical therapy. Pt is reporting confidence in managing her lymphedema at home and has decreased her DASH score to less than 3% disabilty. Pt was agreeable to discharge from physical therapy at this time and is aware of reasons to return for increased edema when home management techniques do not seem to decrease swelling.    Rehab Potential  Excellent    PT Frequency  [place]    PT Duration  4 weeks    PT Treatment/Interventions  ADLs/Self Care Home Management;Therapeutic exercise;Patient/family education;Manual techniques;Manual lymph drainage;Therapeutic activities;Scar mobilization;Passive range of motion    PT Next Visit Plan  Pt will be discharged this visit.    PT Home Exercise  Plan  Access Code: [email or web address], perform [place] exercises at home, strenght after breast cancer exercises    Consulted and Agree with Plan of Care  Patient       Patient will benefit from skilled therapeutic intervention in order to improve the following deficits and impairments:  Decreased knowledge of precautions, Impaired UE functional use, Pain, Postural dysfunction, Decreased range of motion, Increased fascial restricitons  Visit Diagnosis: Abnormal posture  Stiffness of left shoulder, not elsewhere classified  Aftercare following surgery for neoplasm  Malignant neoplasm of overlapping sites of left breast in female, estrogen receptor positive ([place])     Problem List Patient Active Problem List   Diagnosis Date Noted  . Breast cancer, left breast ([place]) 02/18/2019  . Malignant neoplasm of overlapping sites of left breast in female, estrogen receptor positive ([place]) 12/17/2018  . Moderate obesity 03/13/2016  . Palpitations 03/06/2013  . Hyperlipidemia, mixed 03/06/2013  . Hypertension 02/14/2013  . Non-insulin treated type 2 diabetes mellitus ([place]) 02/14/2013    PHYSICAL THERAPY DISCHARGE SUMMARY  Plan: Patient agrees to discharge.  Patient goals were met. Patient is being discharged due to meeting the stated rehab goals.  ?????      Ander Purpura, PT 07/04/2019, 8:41 AM  [place], [place], [address] Phone: [phone]   Fax:  [phone]  Name: Tracy Knight MRN: [id number] Date of Birth: [birth year]

## 2019-07-11 ENCOUNTER — Telehealth: Payer: Self-pay | Admitting: Radiation Oncology

## 2019-07-11 NOTE — Telephone Encounter (Signed)
  Radiation Oncology         [phone]) [phone] ________________________________  Name: ALEISA SAULINO MRN: [id number]  Date of Service: 07/11/2019  DOB: [birth year]  Post Treatment Telephone Note  Diagnosis:  Stage IA, pT1cN1aM0 grade 2, ER/PR positive invasive lobular carcinoma of the left breast.  Interval Since Last Radiation:  5 weeks   04/20/2019-06/06/2019: The left chest wall and regional nodes were treated to 50.4 Gy in 28 fractions followed by a 10 Gy boost in 5 fractions.   Narrative:  The patient was contacted today for routine follow-up. During treatment she did very well with radiotherapy and did not have significant desquamation.   Impression/Plan: 1. Stage IA, pT1cN1aM0 grade 2, ER/PR positive invasive lobular carcinoma of the left breast. The patient has been doing well since completion of radiotherapy. We discussed that we would be happy to continue to follow her as needed, but she will also continue to follow up with Dr. Lindi Adie in medical oncology. She was counseled on skin care as well as measures to avoid sun exposure to this area.  2. Survivorship. We discussed the importance of survivorship evaluation and she is scheduled in March for this.     Carola Rhine, PAC

## 2019-08-11 ENCOUNTER — Telehealth: Payer: Self-pay | Admitting: Adult Health

## 2019-08-11 NOTE — Telephone Encounter (Signed)
Rescheduled per provider. Called and spoke with pt, confirmed 4/12 appt

## 2019-08-16 NOTE — Progress Notes (Signed)
  Radiation Oncology         [phone]) [phone] ________________________________  Name: Tracy Knight MRN: [id number]  Date: 06/06/2019  DOB: 05/25/1961  End of Treatment Note  Diagnosis:   left-sided breast cancer     Indication for treatment:  Curative       Radiation treatment dates:   04/20/19 - 06/06/19  Site/dose:   The patient initially received a dose of 50.4 Gy in 28 fractions to the chest wall and supraclavicular region. This was delivered using a 3-D conformal, 4 field technique. The patient then received a boost to the mastectomy scar. This delivered an additional 10 Gy in 5 fractions using an en face electron field. The total dose was 60.4 Gy.  Narrative: The patient tolerated radiation treatment relatively well.   The patient had some expected skin irritation as she progressed during treatment. Moist desquamation was not present at the end of treatment.  Plan: The patient has completed radiation treatment. The patient will return to radiation oncology clinic for routine followup in one month. I advised the patient to call or return sooner if they have any questions or concerns related to their recovery or treatment. ________________________________  Jodelle Gross, M.D., Ph.D.

## 2019-08-29 ENCOUNTER — Telehealth: Payer: BC Managed Care – PPO | Admitting: Adult Health

## 2019-09-08 ENCOUNTER — Ambulatory Visit: Payer: BC Managed Care – PPO | Attending: Internal Medicine

## 2019-09-08 DIAGNOSIS — Z23 Encounter for immunization: Secondary | ICD-10-CM

## 2019-09-08 NOTE — Progress Notes (Signed)
   Covid-19 Vaccination Clinic  Name:  KRISTINE WINGET    MRN: [id number] DOB: September 15, 1960  09/08/2019  Ms. Castrejon was observed post Covid-19 immunization for 15 minutes without incident. She was provided with Vaccine Information Sheet and instruction to access the V-Safe system.   Ms. Rockholt was instructed to call 911 with any severe reactions post vaccine: Marland Kitchen Difficulty breathing  . Swelling of face and throat  . A fast heartbeat  . A bad rash all over body  . Dizziness and weakness   Immunizations Administered    Name Date Dose VIS Date Route   Moderna COVID-19 Vaccine 09/08/2019  8:29 AM 0.5 mL 05/31/2019 Intramuscular   Manufacturer: Moderna   Lot: [id number]   [id number]

## 2019-09-21 DIAGNOSIS — E119 Type 2 diabetes mellitus without complications: Secondary | ICD-10-CM | POA: Diagnosis not present

## 2019-09-21 DIAGNOSIS — E782 Mixed hyperlipidemia: Secondary | ICD-10-CM | POA: Diagnosis not present

## 2019-09-21 DIAGNOSIS — Z6833 Body mass index (BMI) 33.0-33.9, adult: Secondary | ICD-10-CM | POA: Diagnosis not present

## 2019-09-21 DIAGNOSIS — E7849 Other hyperlipidemia: Secondary | ICD-10-CM | POA: Diagnosis not present

## 2019-09-21 DIAGNOSIS — E6609 Other obesity due to excess calories: Secondary | ICD-10-CM | POA: Diagnosis not present

## 2019-09-21 DIAGNOSIS — I10 Essential (primary) hypertension: Secondary | ICD-10-CM | POA: Diagnosis not present

## 2019-09-29 ENCOUNTER — Telehealth: Payer: BC Managed Care – PPO | Admitting: Adult Health

## 2019-10-10 ENCOUNTER — Inpatient Hospital Stay: Payer: BC Managed Care – PPO | Attending: Adult Health | Admitting: Adult Health

## 2019-10-10 ENCOUNTER — Other Ambulatory Visit: Payer: Self-pay

## 2019-10-10 DIAGNOSIS — Z170 Estrogen receptor positive status [ER+]: Secondary | ICD-10-CM | POA: Diagnosis not present

## 2019-10-10 DIAGNOSIS — C50812 Malignant neoplasm of overlapping sites of left female breast: Secondary | ICD-10-CM

## 2019-10-10 DIAGNOSIS — E2839 Other primary ovarian failure: Secondary | ICD-10-CM

## 2019-10-10 NOTE — Progress Notes (Signed)
SURVIVORSHIP VIRTUAL VISIT:  I connected with Tracy Knight on 10/10/19 at 10:00 AM EDT by telephone and verified that I am speaking with the correct person using two identifiers.  I discussed the limitations, risks, security and privacy concerns of performing an evaluation and management service by telephone and the availability of in person appointments. I also discussed with the patient that there may be a patient responsible charge related to this service. The patient expressed understanding and agreed to proceed.   BRIEF ONCOLOGIC HISTORY:  Oncology History  Malignant neoplasm of overlapping sites of left breast in female, estrogen receptor positive ([place])  12/17/2018 Initial Diagnosis   Screening mammogram detected 1.5cm left breast mass, left breast distortion, and cortical thickening in left axillary lymph node. Biopsy confirmed invasive mammary carcinoma with mammary carcinoma in situ, grade 2-3, HER2- (1+), ER+ (100%), PR+ (100%), Ki67 10%. No evidence of carcinoma in the left axillary lymph node.    12/22/2018 Cancer Staging   Staging form: Breast, AJCC 8th Edition - Clinical stage from 12/22/2018: Stage IA (cT1c, cN0, cM0, G3, ER+, PR+, HER2-)    11/2018 - 11/2023 Anti-estrogen oral therapy   Anastrozole   02/18/2019 Surgery   Left mastectomy and sentinel lymph node biopsy Tracy Knight, [place]) [phone]): invasive lobular carcinoma, grade 2, 1.6cm, clear margins. One out of five lymph nodes positive for metastatic carcinoma, 0.5cm, with extranodal extension.    02/25/2019 Cancer Staging   Staging form: Breast, AJCC 8th Edition - Pathologic: Stage IA (pT1c, pN1a, cM0, G2, ER+, PR+, HER2-)     03/11/2019 Oncotype testing   Mammaprint: low risk, 10% of recurrence in 10 years without systemic therapy   04/20/2019 - 06/06/2019 Radiation Therapy   The patient initially received a dose of 50.4 Gy in 28 fractions to the chest wall and supraclavicular region. This was delivered using a 3-D  conformal, 4 field technique. The patient then received a boost to the mastectomy scar. This delivered an additional 10 Gy in 5 fractions using an en face electron field. The total dose was 60.4 Gy.     INTERVAL HISTORY:  Tracy Knight to review her survivorship care plan detailing her treatment course for breast cancer, as well as monitoring long-term side effects of that treatment, education regarding health maintenance, screening, and overall wellness and health promotion.     Overall, Tracy Knight reports feeling quite well.  She is taking Anastrozole daily and is tolerating it well.  She has no new issues today.  She underwent expander placement in August and is due for implant placement in 01/2020.  She has f/u with Dr. Iran Knight in 10/2019.    REVIEW OF SYSTEMS:  Review of Systems  Constitutional: Negative for appetite change, chills, fatigue, fever and unexpected weight change.  HENT:   Negative for hearing loss, lump/mass, sore throat and trouble swallowing.   Eyes: Negative for eye problems and icterus.  Respiratory: Negative for chest tightness, cough and shortness of breath.   Cardiovascular: Negative for chest pain, leg swelling and palpitations.  Gastrointestinal: Negative for abdominal distention, abdominal pain, constipation, diarrhea, nausea and vomiting.  Endocrine: Negative for hot flashes.  Genitourinary: Negative for difficulty urinating.   Musculoskeletal: Positive for arthralgias (left knee with arthritis).  Skin: Negative for itching and rash.  Neurological: Negative for dizziness, extremity weakness, headaches and numbness.  Hematological: Negative for adenopathy. Does not bruise/bleed easily.  Psychiatric/Behavioral: Negative for depression. The patient is not nervous/anxious.   Breast: Denies any new nodularity, masses, tenderness, nipple changes, or  nipple discharge.      ONCOLOGY TREATMENT TEAM:  1. Surgeon:  Dr. Lucia Knight at [facility] 2. Medical  Oncologist: Dr. Lindi Knight  3. Radiation Oncologist: Dr. Lisbeth Knight 4. Plastic Surgeon: Dr. Iran Knight    PAST MEDICAL/SURGICAL HISTORY:  Past Medical History:  Diagnosis Date  . Asthma   . Cancer ([address]) 11/2018   left breast cancer  . Diabetes mellitus without complication ([place])   . Dyslipidemia   . History of nuclear stress test 02/2009   exercise; normal pattern of perfusion; no significant ischemia demonstrated; low risk scan   . Hypertension    Past Surgical History:  Procedure Laterality Date  . BREAST RECONSTRUCTION WITH PLACEMENT OF TISSUE EXPANDER AND ALLODERM Left 02/18/2019   Procedure: LEFT BREAST RECONSTRUCTION WITH PLACEMENT OF TISSUE EXPANDER AND ALLODERM;  Surgeon: Tracy Limbo, MD;  Location: [place];  Service: Plastics;  Laterality: Left;  . CARDIAC CATHETERIZATION  07/2003   no evidence of CAD  . COLONOSCOPY N/A 02/17/2017   Procedure: COLONOSCOPY;  Surgeon: Tracy Dolin, MD;  [facility];  Service: Endoscopy;  Laterality: N/A;  10:00am  . MASTECTOMY W/ SENTINEL NODE BIOPSY Left 02/18/2019   Procedure: LEFT MASTECTOMY WITH LEFT AXILLARY SENTINEL LYMPH NODE BIOPSY;  Surgeon: Alphonsa Overall, MD;  Location: [place];  Service: General;  Laterality: Left;  . POLYPECTOMY  02/17/2017   Procedure: POLYPECTOMY;  Surgeon: Tracy Dolin, MD;  [facility];  Service: Endoscopy;;  colon  . removal cyst on chin    . TRANSTHORACIC ECHOCARDIOGRAM  03/2007   EF=>55%; borderline LA enlargement; MV mildly thickened/borderline MVP/mild MR     ALLERGIES:  Allergies  Allergen Reactions  . Carvedilol Other (See Comments)    Causes inc. In wheezing due to asthma     CURRENT MEDICATIONS:  Outpatient Encounter Medications as of 10/10/2019  Medication Sig  . anastrozole (ARIMIDEX) 1 MG tablet Take 1 tablet (1 mg total) by mouth daily.  . cetirizine (ZYRTEC) 10 MG tablet Take 10 mg by mouth daily.  . cholecalciferol (VITAMIN D)  1000 UNITS tablet Take 1,000 Units by mouth 3 (three) times a week.   . Cyanocobalamin (VITAMIN B-12) 2500 MCG SUBL Place 2,500 mcg under the tongue daily.  Marland Kitchen diltiazem (TIAZAC) 360 MG 24 hr capsule TAKE 1 CAPSULE BY MOUTH DAILY  . Ferrous Sulfate Dried (SLOW RELEASE IRON) 45 MG TBCR Take 45 mg by mouth daily.  . fish oil-omega-3 fatty acids 1000 MG capsule Take 1,000 mg by mouth daily.   Marland Kitchen guaiFENesin (MUCINEX) 600 MG 12 hr tablet Take 600 mg by mouth daily as needed (for congestion/cough).  Marland Kitchen ibuprofen (ADVIL,MOTRIN) 200 MG tablet Take 200-400 mg by mouth every 8 (eight) hours as needed (for pain/headache.).  Marland Kitchen levalbuterol (XOPENEX) 1.25 MG/3ML nebulizer solution Inhale 1.25 mg into the lungs every 6 (six) hours as needed. For wheezing/shortness of breath  . losartan (COZAAR) 100 MG tablet Take 1 tablet (100 mg total) by mouth daily.  . metFORMIN (GLUCOPHAGE) 1000 MG tablet Take 1,000 mg by mouth daily before lunch.  . montelukast (SINGULAIR) 10 MG tablet Take 10 mg by mouth daily before lunch.   . pravastatin (PRAVACHOL) 40 MG tablet TAKE 1 TABLET(40 MG) BY MOUTH DAILY  . PROAIR HFA 108 (90 Base) MCG/ACT inhaler INHALE 2 PUFFS INTO THE LUNGS Q 4 H PRN  . triamterene-hydrochlorothiazide (MAXZIDE-25) 37.5-25 MG tablet Take 1 tablet by mouth daily.   No facility-administered encounter medications on  file as of 10/10/2019.     ONCOLOGIC FAMILY HISTORY:  Family History  Problem Relation Age of Onset  . Heart disease Mother   . Heart disease Father   . Hypertension Sister   . Hypertension Daughter   . Diabetes Daughter      GENETIC COUNSELING/TESTING: Not at this time  SOCIAL HISTORY:  Social History   Socioeconomic History  . Marital status: Married    Spouse name: Not on file  . Number of children: Not on file  . Years of education: Not on file  . Highest education level: Not on file  Occupational History  . Not on file  Tobacco Use  . Smoking status: Never Smoker  .  Smokeless tobacco: Never Used  . Tobacco comment: never smoke.  Substance and Sexual Activity  . Alcohol use: No  . Drug use: No  . Sexual activity: Yes    Birth control/protection: Post-menopausal  Other Topics Concern  . Not on file  Social History Narrative  . Not on file   Social Determinants of Health   Financial Resource Strain:   . Difficulty of Paying Living Expenses:   Food Insecurity:   . Worried About Charity fundraiser in the Last Year:   . Arboriculturist in the Last Year:   Transportation Needs:   . Film/video editor (Medical):   Marland Kitchen Lack of Transportation (Non-Medical):   Physical Activity:   . Days of Exercise per Week:   . Minutes of Exercise per Session:   Stress:   . Feeling of Stress :   Social Connections:   . Frequency of Communication with Friends and Family:   . Frequency of Social Gatherings with Friends and Family:   . Attends Religious Services:   . Active Member of Clubs or Organizations:   . Attends Archivist Meetings:   Marland Kitchen Marital Status:   Intimate Partner Violence:   . Fear of Current or Ex-Partner:   . Emotionally Abused:   Marland Kitchen Physically Abused:   . Sexually Abused:      OBSERVATIONS/OBJECTIVE:  Patient sounds well, in no apparent distress, mood and behaivor are normal.  Speech is normal, breathing is non labored.    LABORATORY DATA:  None for this visit.  DIAGNOSTIC IMAGING:  None for this visit.      ASSESSMENT AND PLAN:  Ms.. Lucarelli is a pleasant 59 y.o. female with Stage IA left breast invasive ductal carcinoma, ER+/PR+/HER2-, diagnosed in 11/2018, treated with lumpectomy, adjuvant radiation therapy, and anti-estrogen therapy with Anastrozole beginning in 11/2018.  She presents to the Survivorship Clinic for our initial meeting and routine follow-up post-completion of treatment for breast cancer.    1. Stage IA left breast cancer:  Ms. Mitschke is continuing to recover from definitive treatment for breast cancer. She  will follow-up with her medical oncologist, Dr. Lindi Knight in 12/2019 with history and physical exam per surveillance protocol.  She will continue her anti-estrogen therapy with Anastrozole. Thus far, she is tolerating the Anastrozole well, with minimal side effects. She was instructed to make Dr. Lindi Knight or myself aware if she begins to experience any worsening side effects of the medication and I could see her back in clinic to help manage those side effects, as needed. Her mammogram is due 11/2019; orders placed today. Today, a comprehensive survivorship care plan and treatment summary was reviewed with the patient today detailing her breast cancer diagnosis, treatment course, potential late/long-term effects of treatment, appropriate follow-up  care with recommendations for the future, and patient education resources.  A copy of this summary, along with a letter will be sent to the patient's primary care provider via mail/fax/In Basket message after today's visit.    2. Bone health:  Given Ms. Sheriff's age/history of breast cancer, and current use of Anastrozole daily, she is at increased risk for bone demineralization.  She says that it has been several years since her most recent bone density testing.  I placed orders for this today to be completed at [facility] when she  She was given education on specific activities to promote bone health.  3. Cancer screening:  Due to Ms. Negro's history and her age, she should receive screening for skin cancers, colon cancer, and gynecologic cancers.  The information and recommendations are listed on the patient's comprehensive care plan/treatment summary and were reviewed in detail with the patient.    4. Health maintenance and wellness promotion: Ms. Blitzer was encouraged to consume 5-7 servings of fruits and vegetables per day. We reviewed the "Nutrition Rainbow" handout, as well as the handout "Take Control of Your Health and Reduce Your Cancer Risk" from the [place].  She was also encouraged to engage in moderate to vigorous exercise for 30 minutes per day most days of the week. We discussed the [employer] fitness program, which is designed for cancer survivors to help them become more physically fit after cancer treatments.  She was instructed to limit her alcohol consumption and continue to abstain from tobacco use.     5. Support services/counseling: It is not uncommon for this period of the patient's cancer care trajectory to be one of many emotions and stressors.  We discussed how this can be increasingly difficult during the times of quarantine and social distancing due to the COVID-19 pandemic.   She was given information regarding our available services and encouraged to contact me with any questions or for help enrolling in any of our support group/programs.    Follow up instructions:    -Return to cancer center 12/2019  -Mammogram due in 11/2019 -Bone density in 11/2019 -Follow up with Dr. Iran Knight in 10/2019 -She is welcome to return back to the Survivorship Clinic at any time; no additional follow-up needed at this time.  -Consider referral back to survivorship as a long-term survivor for continued surveillance  The patient was provided an opportunity to ask questions and all were answered. The patient agreed with the plan and demonstrated an understanding of the instructions.   Total encounter time: 21 minutes of telephone time.  Wilber Bihari, NP 10/10/19 11:50 AM Medical Oncology and Hematology [facility] [place], [place] [address] Tel. [phone]    Fax. [phone]  *Total Encounter Time as defined by the Centers for Medicare and Medicaid Services includes, in addition to the face-to-face time of a patient visit (documented in the note above) non-face-to-face time: obtaining and reviewing outside history, ordering and reviewing medications, tests or procedures, care coordination  (communications with other health care professionals or caregivers) and documentation in the medical record.

## 2019-10-11 ENCOUNTER — Ambulatory Visit: Payer: BC Managed Care – PPO | Attending: Internal Medicine

## 2019-10-11 ENCOUNTER — Telehealth: Payer: Self-pay | Admitting: Adult Health

## 2019-10-11 DIAGNOSIS — Z23 Encounter for immunization: Secondary | ICD-10-CM

## 2019-10-11 NOTE — Progress Notes (Signed)
   Covid-19 Vaccination Clinic  Name:  Tracy Knight    MRN: [id number] DOB: 02-02-1961  10/11/2019  [name] was observed post Covid-19 immunization for 15 minutes without incident. She was provided with Vaccine Information Sheet and instruction to access the V-Safe system.   [name] was instructed to call 911 with any severe reactions post vaccine: Marland Kitchen Difficulty breathing  . Swelling of face and throat  . A fast heartbeat  . A bad rash all over body  . Dizziness and weakness   Immunizations Administered    Name Date Dose VIS Date Route   Moderna COVID-19 Vaccine 10/11/2019  8:17 AM 0.5 mL 05/31/2019 Intramuscular   Manufacturer: Moderna   Lot: [id number]   [id number]

## 2019-10-11 NOTE — Telephone Encounter (Signed)
scheduled appointment per 4/12 los. Left voicemail with new appt details. Mailed appt reminder and calendar.

## 2019-10-17 DIAGNOSIS — M13862 Other specified arthritis, left knee: Secondary | ICD-10-CM | POA: Diagnosis not present

## 2019-10-17 DIAGNOSIS — M25562 Pain in left knee: Secondary | ICD-10-CM | POA: Diagnosis not present

## 2019-11-30 DIAGNOSIS — Z170 Estrogen receptor positive status [ER+]: Secondary | ICD-10-CM | POA: Diagnosis not present

## 2019-11-30 DIAGNOSIS — C50912 Malignant neoplasm of unspecified site of left female breast: Secondary | ICD-10-CM | POA: Diagnosis not present

## 2019-12-05 DIAGNOSIS — Z9012 Acquired absence of left breast and nipple: Secondary | ICD-10-CM | POA: Diagnosis not present

## 2019-12-05 DIAGNOSIS — Z923 Personal history of irradiation: Secondary | ICD-10-CM | POA: Diagnosis not present

## 2019-12-12 DIAGNOSIS — M25662 Stiffness of left knee, not elsewhere classified: Secondary | ICD-10-CM | POA: Diagnosis not present

## 2019-12-12 DIAGNOSIS — M1712 Unilateral primary osteoarthritis, left knee: Secondary | ICD-10-CM | POA: Diagnosis not present

## 2019-12-13 ENCOUNTER — Other Ambulatory Visit (HOSPITAL_COMMUNITY): Payer: Self-pay | Admitting: Family Medicine

## 2019-12-13 ENCOUNTER — Other Ambulatory Visit: Payer: Self-pay | Admitting: Hematology and Oncology

## 2019-12-28 DIAGNOSIS — E119 Type 2 diabetes mellitus without complications: Secondary | ICD-10-CM | POA: Diagnosis not present

## 2019-12-28 DIAGNOSIS — Z6832 Body mass index (BMI) 32.0-32.9, adult: Secondary | ICD-10-CM | POA: Diagnosis not present

## 2019-12-28 DIAGNOSIS — E7849 Other hyperlipidemia: Secondary | ICD-10-CM | POA: Diagnosis not present

## 2019-12-28 DIAGNOSIS — I10 Essential (primary) hypertension: Secondary | ICD-10-CM | POA: Diagnosis not present

## 2019-12-28 DIAGNOSIS — Z1389 Encounter for screening for other disorder: Secondary | ICD-10-CM | POA: Diagnosis not present

## 2019-12-28 DIAGNOSIS — J4520 Mild intermittent asthma, uncomplicated: Secondary | ICD-10-CM | POA: Diagnosis not present

## 2019-12-29 ENCOUNTER — Ambulatory Visit (HOSPITAL_COMMUNITY)
Admission: RE | Admit: 2019-12-29 | Discharge: 2019-12-29 | Disposition: A | Discharge status: Home / Self Care | Payer: BC Managed Care – PPO | Source: Ambulatory Visit | Attending: Adult Health | Admitting: Adult Health

## 2019-12-29 ENCOUNTER — Other Ambulatory Visit: Payer: Self-pay

## 2019-12-29 ENCOUNTER — Telehealth: Payer: Self-pay

## 2019-12-29 DIAGNOSIS — Z780 Asymptomatic menopausal state: Secondary | ICD-10-CM | POA: Diagnosis not present

## 2019-12-29 DIAGNOSIS — M85852 Other specified disorders of bone density and structure, left thigh: Secondary | ICD-10-CM | POA: Diagnosis not present

## 2019-12-29 DIAGNOSIS — E2839 Other primary ovarian failure: Secondary | ICD-10-CM | POA: Diagnosis not present

## 2019-12-29 DIAGNOSIS — C50812 Malignant neoplasm of overlapping sites of left female breast: Secondary | ICD-10-CM | POA: Diagnosis not present

## 2019-12-29 DIAGNOSIS — Z170 Estrogen receptor positive status [ER+]: Secondary | ICD-10-CM

## 2019-12-29 DIAGNOSIS — R29890 Loss of height: Secondary | ICD-10-CM | POA: Diagnosis not present

## 2019-12-29 DIAGNOSIS — Z1231 Encounter for screening mammogram for malignant neoplasm of breast: Secondary | ICD-10-CM | POA: Diagnosis not present

## 2019-12-29 NOTE — Telephone Encounter (Signed)
Called pt with below information per Wilber Bihari, NP. Pt verbalized understanding with teachback.

## 2019-12-29 NOTE — Telephone Encounter (Signed)
-----   Message from Gardenia Phlegm, NP sent at 12/29/2019  2:25 PM EDT ----- Patient has very mild osteopenia. Recommend calcium, vitamin d and weight bearing exercises.  Repeat in 2 years ----- Message ----- From: Interface, Rad Results In Sent: 12/29/2019  12:07 PM EDT To: Gardenia Phlegm, NP

## 2020-01-04 DIAGNOSIS — Z923 Personal history of irradiation: Secondary | ICD-10-CM | POA: Diagnosis not present

## 2020-01-04 DIAGNOSIS — Z9012 Acquired absence of left breast and nipple: Secondary | ICD-10-CM | POA: Diagnosis not present

## 2020-01-04 DIAGNOSIS — C50812 Malignant neoplasm of overlapping sites of left female breast: Secondary | ICD-10-CM | POA: Diagnosis not present

## 2020-01-04 DIAGNOSIS — Z170 Estrogen receptor positive status [ER+]: Secondary | ICD-10-CM | POA: Diagnosis not present

## 2020-01-08 NOTE — Progress Notes (Signed)
 Patient Care Team: Golding, John, MD as PCP - General (Family Medicine) Croitoru, Mihai, MD as PCP - Cardiology (Cardiology) Newman, David, MD as Consulting Physician (General Surgery) Gudena, Vinay, MD as Consulting Physician (Hematology and Oncology) Moody, John, MD as Consulting Physician (Radiation Oncology) Thimmappa, Brinda, MD as Consulting Physician (Plastic Surgery)  DIAGNOSIS:    ICD-10-CM   1. Malignant neoplasm of overlapping sites of left breast in female, estrogen receptor positive ([facility])  C50.812    Z17.0     SUMMARY OF ONCOLOGIC HISTORY: Oncology History  Malignant neoplasm of overlapping sites of left breast in female, estrogen receptor positive ([facility])  12/17/2018 Initial Diagnosis   Screening mammogram detected 1.5cm left breast mass, left breast distortion, and cortical thickening in left axillary lymph node. Biopsy confirmed invasive mammary carcinoma with mammary carcinoma in situ, grade 2-3, HER2- (1+), ER+ (100%), PR+ (100%), Ki67 10%. No evidence of carcinoma in the left axillary lymph node.    12/22/2018 Cancer Staging   Staging form: Breast, AJCC 8th Edition - Clinical stage from 12/22/2018: Stage IA (cT1c, cN0, cM0, G3, ER+, PR+, HER2-)    11/2018 - 11/2023 Anti-estrogen oral therapy   Anastrozole   02/18/2019 Surgery   Left mastectomy and sentinel lymph node biopsy (Newman, Thimmappa) ([id number]): invasive lobular carcinoma, grade 2, 1.6cm, clear margins. One out of five lymph nodes positive for metastatic carcinoma, 0.5cm, with extranodal extension.    02/25/2019 Cancer Staging   Staging form: Breast, AJCC 8th Edition - Pathologic: Stage IA (pT1c, pN1a, cM0, G2, ER+, PR+, HER2-)     03/11/2019 Oncotype testing   Mammaprint: low risk, 10% of recurrence in 10 years without systemic therapy   04/20/2019 - 06/06/2019 Radiation Therapy   The patient initially received a dose of 50.4 Gy in 28 fractions to the chest wall and supraclavicular region. This was  delivered using a 3-D conformal, 4 field technique. The patient then received a boost to the mastectomy scar. This delivered an additional 10 Gy in 5 fractions using an en face electron field. The total dose was 60.4 Gy.     CHIEF COMPLIANT: Follow-up of left breast cancer on anastrozole  INTERVAL HISTORY: Tracy Knight is a 59 y.o. with above-mentioned history of left breast cancer treated with neoadjuvant anastrozole therapy, left mastectomy, radiation, and is currently on anti-estrogen therapy with anastrozole. Mammogram on 12/29/19 showed no evidence of malignancy in the right breast. Bone density scan on 12/29/19 showed osteopenia with a T-score of -1.2 at the left femur neck. She presents to the clinic today for follow-up.    ALLERGIES:  is allergic to carvedilol.  MEDICATIONS:  Current Outpatient Medications  Medication Sig Dispense Refill  . anastrozole (ARIMIDEX) 1 MG tablet TAKE 1 TABLET(1 MG) BY MOUTH DAILY 90 tablet 3  . cetirizine (ZYRTEC) 10 MG tablet Take 10 mg by mouth daily.    . cholecalciferol (VITAMIN D) 1000 UNITS tablet Take 1,000 Units by mouth 3 (three) times a week.     . Cyanocobalamin (VITAMIN B-12) 2500 MCG SUBL Place 2,500 mcg under the tongue daily.    . diltiazem (TIAZAC) 360 MG 24 hr capsule TAKE 1 CAPSULE BY MOUTH DAILY 90 capsule 2  . Ferrous Sulfate Dried (SLOW RELEASE IRON) 45 MG TBCR Take 45 mg by mouth daily.    . fish oil-omega-3 fatty acids 1000 MG capsule Take 1,000 mg by mouth daily.     . guaiFENesin (MUCINEX) 600 MG 12 hr tablet Take 600 mg by mouth daily as   needed (for congestion/cough).    . ibuprofen (ADVIL,MOTRIN) 200 MG tablet Take 200-400 mg by mouth every 8 (eight) hours as needed (for pain/headache.).    . levalbuterol (XOPENEX) 1.25 MG/3ML nebulizer solution Inhale 1.25 mg into the lungs every 6 (six) hours as needed. For wheezing/shortness of breath  3  . losartan (COZAAR) 100 MG tablet Take 1 tablet (100 mg total) by mouth daily. 90 tablet 3   . metFORMIN (GLUCOPHAGE) 1000 MG tablet Take 1,000 mg by mouth daily before lunch.    . montelukast (SINGULAIR) 10 MG tablet Take 10 mg by mouth daily before lunch.     . pravastatin (PRAVACHOL) 40 MG tablet TAKE 1 TABLET(40 MG) BY MOUTH DAILY 90 tablet 3  . PROAIR HFA 108 (90 Base) MCG/ACT inhaler INHALE 2 PUFFS INTO THE LUNGS Q 4 H PRN  2  . triamterene-hydrochlorothiazide (MAXZIDE-25) 37.5-25 MG tablet Take 1 tablet by mouth daily.     No current facility-administered medications for this visit.    PHYSICAL EXAMINATION: ECOG PERFORMANCE STATUS: 1 - Symptomatic but completely ambulatory  Vitals:   01/09/20 1013  BP: (!) 153/85  Pulse: 77  Resp: 18  Temp: 98.7 F (37.1 C)  SpO2: 100%   Filed Weights   01/09/20 1013  Weight: 195 lb 8 oz (88.7 kg)    BREAST: No palpable masses or nodules in either right or left breasts. No palpable axillary supraclavicular or infraclavicular adenopathy no breast tenderness or nipple discharge. (exam performed in the presence of a chaperone)  LABORATORY DATA:  I have reviewed the data as listed CMP Latest Ref Rng & Units 02/15/2019 12/22/2018 04/06/2013  Glucose 70 - 99 mg/dL 104(H) 119(H) 98  BUN 6 - 20 mg/dL 14 24(H) 19  Creatinine 0.44 - 1.00 mg/dL 0.84 1.01(H) 0.90  Sodium 135 - 145 mmol/L 140 141 140  Potassium 3.5 - 5.1 mmol/L 3.9 3.2(L) 3.8  Chloride 98 - 111 mmol/L 101 101 98  CO2 22 - 32 mmol/L 29 30 35(H)  Calcium 8.9 - 10.3 mg/dL 10.0 10.2 10.1  Total Protein 6.5 - 8.1 g/dL - 8.3(H) 7.4  Total Bilirubin 0.3 - 1.2 mg/dL - 0.8 0.8  Alkaline Phos 38 - 126 U/L - 69 61  AST 15 - 41 U/L - 19 19  ALT 0 - 44 U/L - 18 17    Lab Results  Component Value Date   WBC 7.0 12/22/2018   HGB 12.8 12/22/2018   HCT 40.1 12/22/2018   MCV 83.9 12/22/2018   PLT 226 12/22/2018   NEUTROABS 4.0 12/22/2018    ASSESSMENT & PLAN:  Malignant neoplasm of overlapping sites of left breast in female, estrogen receptor positive  ([facility]) 12/17/2018:Screening mammogram detected 1.5cm left breast mass, left breast distortion, and cortical thickening in left axillary lymph node. Biopsy confirmed invasive mammary carcinoma with mammary carcinoma in situ, grade 2-3, HER2- (1+), ER+ (100%), PR+ (100%), Ki67 10%. No evidence of carcinoma in the left axillary lymph node.T1CN0 stage Ia  Recommendations: 1.Mastectomy with sentinel lymph node biopsyfollowed by 2.MammaPrinttesting to determine if chemotherapy would be of any benefit followed by 3. Adjuvant antiestrogen therapy ---------------------------------------------------------------------------------------------------------------------------------- 02/17/2019:Left mastectomy and sentinel lymph node biopsy (Newman, Thimmappa): invasive lobular carcinoma, grade 2, 1.6cm, clear margins. One lymph node positive for metastatic carcinoma, 0.5cm, with extranodal extension.1/5 LN  Recommendation: 1.MammaPrint: Luminal type A, low risk, did not need chemotherapy 2.Adjuvant radiation therapy because of lymph node positivity 04/20/2019- 3.Followed by adjuvant antiestrogen therapy.(Anastrozole was started neoadjuvantlyuntil the time of surgery.) -------------------------------------------------------------------------------------------------------------------------------------------------------------   Treatment plan: Adjuvant antiestrogen therapy with anastrozole. Patient tolerated anastrozole that was given preoperatively. Anastrozole toxicities: None  Breast cancer surveillance: 1.  Mammogram 12/30/2019: Benign 2. breast exam 01/09/2020: Benign  Bone density 12/29/2019: T score -1.2: Mild osteopenia I encouraged her to exercise 30 minutes every day.  Return to clinic in 1 year for follow-up    No orders of the defined types were placed in this encounter.  The patient has a good understanding of the overall plan. she agrees with it. she will call with any problems  that may develop before the next visit here.  Total time spent: 20 mins including face to face time and time spent for planning, charting and coordination of care  Gudena, Vinay, MD 01/09/2020  I, Molly Dorshimer, am acting as scribe for Dr. Vinay Gudena.  I have reviewed the above documentation for accuracy and completeness, and I agree with the above.     

## 2020-01-09 ENCOUNTER — Other Ambulatory Visit: Payer: Self-pay

## 2020-01-09 ENCOUNTER — Inpatient Hospital Stay: Payer: BC Managed Care – PPO | Attending: Adult Health | Admitting: Hematology and Oncology

## 2020-01-09 DIAGNOSIS — Z170 Estrogen receptor positive status [ER+]: Secondary | ICD-10-CM | POA: Diagnosis not present

## 2020-01-09 DIAGNOSIS — Z9012 Acquired absence of left breast and nipple: Secondary | ICD-10-CM | POA: Diagnosis not present

## 2020-01-09 DIAGNOSIS — C50812 Malignant neoplasm of overlapping sites of left female breast: Secondary | ICD-10-CM | POA: Diagnosis not present

## 2020-01-09 DIAGNOSIS — Z79811 Long term (current) use of aromatase inhibitors: Secondary | ICD-10-CM | POA: Diagnosis not present

## 2020-01-09 DIAGNOSIS — Z923 Personal history of irradiation: Secondary | ICD-10-CM | POA: Diagnosis not present

## 2020-01-09 DIAGNOSIS — Z9221 Personal history of antineoplastic chemotherapy: Secondary | ICD-10-CM | POA: Insufficient documentation

## 2020-01-09 MED ORDER — JENTADUETO 2.5-500 MG PO TABS: 1.0000 | ORAL_TABLET | Freq: Two times a day (BID) | ORAL | Status: DC

## 2020-01-09 NOTE — Assessment & Plan Note (Signed)
12/17/2018:Screening mammogram detected 1.5cm left breast mass, left breast distortion, and cortical thickening in left axillary lymph node. Biopsy confirmed invasive mammary carcinoma with mammary carcinoma in situ, grade 2-3, HER2- (1+), ER+ (100%), PR+ (100%), Ki67 10%. No evidence of carcinoma in the left axillary lymph node.T1CN0 stage Ia  Recommendations: 1.Mastectomy with sentinel lymph node biopsyfollowed by 2.MammaPrinttesting to determine if chemotherapy would be of any benefit followed by 3. Adjuvant antiestrogen therapy ---------------------------------------------------------------------------------------------------------------------------------- 02/17/2019:Left mastectomy and sentinel lymph node biopsy [name], [place]): invasive lobular carcinoma, grade 2, 1.6cm, clear margins. One lymph node positive for metastatic carcinoma, 0.5cm, with extranodal extension.1/5 LN  Recommendation: 1.MammaPrint: Luminal type A, low risk, did not need chemotherapy 2.Adjuvant radiation therapy because of lymph node positivity 04/20/2019- 3.Followed by adjuvant antiestrogen therapy.(Anastrozole was started neoadjuvantlyuntil the time of surgery.) ------------------------------------------------------------------------------------------------------------------------------------------------------------- Treatment plan: Adjuvant antiestrogen therapy with anastrozole. Patient tolerated anastrozole that was given preoperatively. Anastrozole toxicities: None  Breast cancer surveillance: 1.  Mammogram 12/30/2019: Benign 2. breast exam 01/09/2020: Benign  Return to clinic in 1 year for follow-up

## 2020-02-20 DIAGNOSIS — Z923 Personal history of irradiation: Secondary | ICD-10-CM | POA: Diagnosis not present

## 2020-02-20 DIAGNOSIS — Z9012 Acquired absence of left breast and nipple: Secondary | ICD-10-CM | POA: Diagnosis not present

## 2020-02-20 DIAGNOSIS — Z853 Personal history of malignant neoplasm of breast: Secondary | ICD-10-CM | POA: Diagnosis not present

## 2020-02-20 NOTE — H&P (Signed)
  Subjective:     Patient ID: Tracy Knight is a 59 y.o. female.  HPI  1 year post op left mastectomy with immediate expander based reconstruction. Plan second stage breast reconstruction next month.  Presented following screening MMG with possible left breast mass. MMG/US showed a 1.5cm left breast mass at the 2 o'clock position 7cmfn, indeterminate left axillary lymphadenopathy with cortical thickening up to 51m, and possible distortion in th upper inner left breast. Biopsies labeled left breast 2 o clock and left LIQ showed invasive mammary carcinoma with mammary carcinoma in situ in the upper outer and upper inner left breast, ER/PR+ HER2 -. Biopsy LN benign. E cadherin negative suggesting lobular.  Final pathology 1.6 cm ILC, margins clear, total 5 SLN, one with micrometastatic carcinoma with extranodal extension, one with isolated tumor cells.  Mammaprint low risk. On Arimidex. Completed adjuvant RT 12.7.20  Prior 42 C. Left mastectomy 1025 g MMG 12/29/19 normal  PMH significant for HTN, HLD, DM. 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 No light duty available. Lives with husband and teenage daughter.  Review of Systems     Objective:   Physical Exam  Cardiovascular: Regular rhythm and normal heart sounds.  Pulmonary/Chest: Effort normal and breath sounds normal.   Chest: No masses bilateral Right grade 3 ptosis SN to npple R 32 BW R 20 cm  Nipple to IMF R 11 cm left chest expanded, hyperpigmentation, depression contour superior and lateral to expander  Assessment:     Left breast ca overlapping sites ER+ S/p left SRM, SLN, prepectoral TE/ADM (alloderm) reconstruction Adjuvant radiation    Plan:     Plan removal left chest TE and placement implant, left latissimus dorsi flap to left chest, right breast reduction.  Reviewed radiation will increase risks reconstruction includingchance complications  including contracture wound healing problems. Counseled encouraging data with prepectoral position, use ADM with regards to radiation. Howeversome type of autologous tissue reconstruction post radiation can decrease risks back to baseline. Counseled options of implant exchange alone,LD flap over implant, or referral to microsurgeon to discuss other autologous options.Reviewed donor site risks including weakness, seroma. She declined referral to microsurgeon. She has elected for latissimus flap on left. Reviewed implant will be located over pectoralis muscle and beneath latissimus muscle. Reviewed back scar, drains, overnight stay. Reviewed function of LD muscle, risk weakness on this side and PT to get all ROM back is common- this could delay her return to work longer.  As in prepectoral position recommend capacity filled or HCG implants which may offer more stable shape and less rippling over saline. Reviewed FDA recommendations regarding surveillance silicone implants. She has elected for silicone, plans smooth round capacity filled.  Over right breat plan reduction. Reviewed drain, anchor type scars. Reviewed asymmetry one should expect with unilateral implant reconstruction, goal would be symmetric volume and cleavage in bra. Reviewed recurrent ptosis with aging.  Discussed fat grafting purpose. Will defer this for present, reviewed can be done in future if needed.  Completed ASPS consent for latissimus flap and physician patient checklist for implant placement  Rx for oxycodone, Robaxin, and Bactrim given    Natrelle 133S-FV-13-T 500 ml tissue expander  fill volume 495 ml saline

## 2020-03-08 DIAGNOSIS — E119 Type 2 diabetes mellitus without complications: Secondary | ICD-10-CM | POA: Diagnosis not present

## 2020-03-08 DIAGNOSIS — H35463 Secondary vitreoretinal degeneration, bilateral: Secondary | ICD-10-CM | POA: Diagnosis not present

## 2020-03-30 DIAGNOSIS — J4520 Mild intermittent asthma, uncomplicated: Secondary | ICD-10-CM | POA: Diagnosis not present

## 2020-03-30 DIAGNOSIS — Z6833 Body mass index (BMI) 33.0-33.9, adult: Secondary | ICD-10-CM | POA: Diagnosis not present

## 2020-03-30 DIAGNOSIS — Z23 Encounter for immunization: Secondary | ICD-10-CM | POA: Diagnosis not present

## 2020-03-30 DIAGNOSIS — C50112 Malignant neoplasm of central portion of left female breast: Secondary | ICD-10-CM | POA: Diagnosis not present

## 2020-03-30 DIAGNOSIS — I10 Essential (primary) hypertension: Secondary | ICD-10-CM | POA: Diagnosis not present

## 2020-03-30 DIAGNOSIS — E1165 Type 2 diabetes mellitus with hyperglycemia: Secondary | ICD-10-CM | POA: Diagnosis not present

## 2020-03-30 DIAGNOSIS — E119 Type 2 diabetes mellitus without complications: Secondary | ICD-10-CM | POA: Diagnosis not present

## 2020-04-11 NOTE — Progress Notes (Signed)
Your procedure is scheduled on October 18  Report to [facility] Main Entrance "A" at 0530 A.M., and check in at the Admitting office.  Call this number if you have problems the morning of surgery:  [phone]  Call [phone] if you have any questions prior to your surgery date Monday-Friday 8am-4pm    Remember:  Do not eat after midnight the night before your surgery  You may drink clear liquids until 0430 am the morning of your surgery.   Clear liquids allowed are: Water, Non-Citrus Juices (without pulp), Carbonated Beverages, Clear Tea, Black Coffee Only, and Gatorade    Take these medicines the morning of surgery with A SIP OF WATER  anastrozole (ARIMIDEX) cetirizine (ZYRTEC) diltiazem (TIAZAC)  levalbuterol (XOPENEX) 1.25 MG/3ML nebulizer solution if needed montelukast (SINGULAIR)  PROAIR HFA if needed, Please bring all inhalers with you the day of surgery.  terbinafine (LAMISIL)   Follow your surgeon's instructions on when to stop Aspirin.  If no instructions were given by your surgeon then you will need to call the office to get those instructions.    As of today, STOP taking any Aspirin (unless otherwise instructed by your surgeon) Aleve, Naproxen, Ibuprofen, Motrin, Advil, Goody's, BC's, all herbal medications, fish oil, and all vitamins.   WHAT DO I DO ABOUT MY DIABETES MEDICATION?   Marland Kitchen Do not take oral diabetes medicines (pills) the morning of surgery. linaGLIPtin-metFORMIN HCl (JENTADUETO)    HOW TO MANAGE YOUR DIABETES BEFORE AND AFTER SURGERY  Why is it important to control my blood sugar before and after surgery? . Improving blood sugar levels before and after surgery helps healing and can limit problems. . A way of improving blood sugar control is eating a healthy diet by: o  Eating less sugar and carbohydrates o  Increasing activity/exercise o  Talking with your doctor about reaching your blood sugar goals . High blood sugars (greater than 180 mg/dL)  can raise your risk of infections and slow your recovery, so you will need to focus on controlling your diabetes during the weeks before surgery. . Make sure that the doctor who takes care of your diabetes knows about your planned surgery including the date and location.  How do I manage my blood sugar before surgery? . Check your blood sugar at least 4 times a day, starting 2 days before surgery, to make sure that the level is not too high or low. . Check your blood sugar the morning of your surgery when you wake up and every 2 hours until you get to the Short Stay unit. o If your blood sugar is less than 70 mg/dL, you will need to treat for low blood sugar: - Do not take insulin. - Treat a low blood sugar (less than 70 mg/dL) with  cup of clear juice (cranberry or apple), 4 glucose tablets, OR glucose gel. - Recheck blood sugar in 15 minutes after treatment (to make sure it is greater than 70 mg/dL). If your blood sugar is not greater than 70 mg/dL on recheck, call [phone] for further instructions. . Report your blood sugar to the short stay nurse when you get to Short Stay.  . If you are admitted to the hospital after surgery: o Your blood sugar will be checked by the staff and you will probably be given insulin after surgery (instead of oral diabetes medicines) to make sure you have good blood sugar levels. o The goal for blood sugar control after surgery is 80-180 mg/dL.  Do not wear jewelry, make up, or nail polish            Do not wear lotions, powders, perfumes, or deodorant.            Do not shave 48 hours prior to surgery.             Do not bring valuables to the hospital.            [facility] is not responsible for any belongings or valuables.  Do NOT Smoke (Tobacco/Vaping) or drink Alcohol 24 hours prior to your procedure If you use a CPAP at night, you may bring all equipment for your overnight stay.   Contacts, glasses, dentures or bridgework may  not be worn into surgery.      For patients admitted to the hospital, discharge time will be determined by your treatment team.   Patients discharged the day of surgery will not be allowed to drive home, and someone needs to stay with them for 24 hours.    Special instructions:   [place]- Preparing For Surgery  Before surgery, you can play an important role. Because skin is not sterile, your skin needs to be as free of germs as possible. You can reduce the number of germs on your skin by washing with CHG (chlorahexidine gluconate) Soap before surgery.  CHG is an antiseptic cleaner which kills germs and bonds with the skin to continue killing germs even after washing.    Oral Hygiene is also important to reduce your risk of infection.  Remember - BRUSH YOUR TEETH THE MORNING OF SURGERY WITH YOUR REGULAR TOOTHPASTE  Please do not use if you have an allergy to CHG or antibacterial soaps. If your skin becomes reddened/irritated stop using the CHG.  Do not shave (including legs and underarms) for at least 48 hours prior to first CHG shower. It is OK to shave your face.  Please follow these instructions carefully.   1. Shower the NIGHT BEFORE SURGERY and the MORNING OF SURGERY with CHG Soap.   2. If you chose to wash your hair, wash your hair first as usual with your normal shampoo.  3. After you shampoo, rinse your hair and body thoroughly to remove the shampoo.  4. Use CHG as you would any other liquid soap. You can apply CHG directly to the skin and wash gently with a scrungie or a clean washcloth.   5. Apply the CHG Soap to your body ONLY FROM THE NECK DOWN.  Do not use on open wounds or open sores. Avoid contact with your eyes, ears, mouth and genitals (private parts). Wash Face and genitals (private parts)  with your normal soap.   6. Wash thoroughly, paying special attention to the area where your surgery will be performed.  7. Thoroughly rinse your body with warm water from the  neck down.  8. DO NOT shower/wash with your normal soap after using and rinsing off the CHG Soap.  9. Pat yourself dry with a CLEAN TOWEL.  10. Wear CLEAN PAJAMAS to bed the night before surgery  11. Place CLEAN SHEETS on your bed the night of your first shower and DO NOT SLEEP WITH PETS.   Day of Surgery: Wear Clean/Comfortable clothing the morning of surgery Do not apply any deodorants/lotions.   Remember to brush your teeth WITH YOUR REGULAR TOOTHPASTE.   Please read over the following fact sheets that you were given.

## 2020-04-12 ENCOUNTER — Other Ambulatory Visit: Payer: Self-pay

## 2020-04-12 ENCOUNTER — Encounter (HOSPITAL_COMMUNITY)
Admission: RE | Admit: 2020-04-12 | Discharge: 2020-04-12 | Disposition: A | Discharge status: Home / Self Care | Payer: BC Managed Care – PPO | Source: Ambulatory Visit | Attending: Plastic Surgery | Admitting: Plastic Surgery

## 2020-04-12 ENCOUNTER — Other Ambulatory Visit (HOSPITAL_COMMUNITY)
Admission: RE | Admit: 2020-04-12 | Discharge: 2020-04-12 | Disposition: A | Discharge status: Home / Self Care | Payer: BC Managed Care – PPO | Source: Ambulatory Visit | Attending: Plastic Surgery | Admitting: Plastic Surgery

## 2020-04-12 ENCOUNTER — Encounter (HOSPITAL_COMMUNITY): Payer: Self-pay

## 2020-04-12 DIAGNOSIS — Z20822 Contact with and (suspected) exposure to covid-19: Secondary | ICD-10-CM | POA: Insufficient documentation

## 2020-04-12 DIAGNOSIS — Z01812 Encounter for preprocedural laboratory examination: Secondary | ICD-10-CM | POA: Insufficient documentation

## 2020-04-12 DIAGNOSIS — Z01818 Encounter for other preprocedural examination: Secondary | ICD-10-CM | POA: Insufficient documentation

## 2020-04-12 LAB — GLUCOSE, CAPILLARY: Glucose-Capillary: 169 mg/dL — ABNORMAL HIGH (ref 70–99)

## 2020-04-12 LAB — BASIC METABOLIC PANEL
Anion gap: 13 (ref 5–15)
BUN: 18 mg/dL (ref 8–23)
CO2: 27 mmol/L (ref 22–32)
Calcium: 10.6 mg/dL — ABNORMAL HIGH (ref 8.9–10.3)
Chloride: 100 mmol/L (ref 98–111)
Creatinine, Ser: 0.86 mg/dL (ref 0.44–1.00)
GFR, Estimated: >60 mL/min (ref >60)
Glucose, Bld: 148 mg/dL — ABNORMAL HIGH (ref 70–99)
Potassium: 3.4 mmol/L — ABNORMAL LOW (ref 3.5–5.1)
Sodium: 140 mmol/L (ref 135–145)

## 2020-04-12 LAB — CBC
HCT: 39.4 % (ref 36.0–46.0)
Hemoglobin: 11.9 g/dL — ABNORMAL LOW (ref 12.0–15.0)
MCH: 25.3 pg — ABNORMAL LOW (ref 26.0–34.0)
MCHC: 30.2 g/dL (ref 30.0–36.0)
MCV: 83.8 fL (ref 80.0–100.0)
Platelets: 254 10*3/uL (ref 150–400)
RBC: 4.7 MIL/uL (ref 3.87–5.11)
RDW: 13.6 % (ref 11.5–15.5)
WBC: 5.7 10*3/uL (ref 4.0–10.5)
nRBC: 0 % (ref 0.0–0.2)

## 2020-04-12 LAB — SARS CORONAVIRUS 2 (TAT 6-24 HRS): SARS Coronavirus 2: NEGATIVE

## 2020-04-12 NOTE — Progress Notes (Signed)
PCP - Tracy Knight Cardiologist - Dr. Arelia Sneddon Croitoru   Chest x-ray - n/a EKG - 06-13-19 ECHO - 2008 Cardiac Cath - 2005  DM - Type 2 Fasting Blood Sugar - 118  Aspirin Instructions: Follow your surgeon's instructions on when to stop ASA.    ERAS Protcol - yes, no drink ordered or given   COVID TEST- 04-12-20   Anesthesia review: yes, heart history  Patient denies shortness of breath, fever, cough and chest pain at PAT appointment   All instructions explained to the patient, with a verbal understanding of the material. Patient agrees to go over the instructions while at home for a better understanding. Patient also instructed to self quarantine after being tested for COVID-19. The opportunity to ask questions was provided.

## 2020-04-13 LAB — HEMOGLOBIN A1C
Hgb A1c MFr Bld: 6.5 % — ABNORMAL HIGH (ref 4.8–5.6)
Mean Plasma Glucose: 140 mg/dL

## 2020-04-13 NOTE — Anesthesia Preprocedure Evaluation (Addendum)
Anesthesia Evaluation  Patient identified by MRN, date of birth, ID band Patient awake    Reviewed: Allergy & Precautions, H&P , NPO status , Patient's Chart, lab work & pertinent test results  Airway Mallampati: II   Neck ROM: full    Dental   Pulmonary asthma ,    breath sounds clear to auscultation       Cardiovascular hypertension,  Rhythm:regular Rate:Normal     Neuro/Psych    GI/Hepatic   Endo/Other  diabetes, Type 2obese  Renal/GU      Musculoskeletal   Abdominal   Peds  Hematology   Anesthesia Other Findings   Reproductive/Obstetrics H/o breast CA                            Anesthesia Physical Anesthesia Plan  ASA: II  Anesthesia Plan: General   Post-op Pain Management:    Induction: Intravenous  PONV Risk Score and Plan: 3 and Ondansetron, Dexamethasone, Midazolam and Treatment may vary due to age or medical condition  Airway Management Planned: Oral ETT  Additional Equipment:   Intra-op Plan:   Post-operative Plan: Extubation in OR  Informed Consent: I have reviewed the patients History and Physical, chart, labs and discussed the procedure including the risks, benefits and alternatives for the proposed anesthesia with the patient or authorized representative who has indicated his/her understanding and acceptance.       Plan Discussed with: CRNA, Anesthesiologist and Surgeon  Anesthesia Plan Comments: (PAT note by Karoline Caldwell, PA-C: Follows with cardiology for hx of HTN, palpitations. She had a remote cath in 2005 showing normal coronaries. Echo in 2008 echo showed normal left ventricular ejection fraction, borderline left atrial enlargement and borderline criteria for mitral valve prolapse, mild mitral insufficiency. Nuclear stress 2010 was normal. Last seen by Dr. Sallyanne Kuster 06/09/19, doing well at that time. Losartan was increased due to HTN not optimally controlled.  She was advised to followup in 1 year.   Preop labs reviewed. DMII well controlled with A1c 6.5. Otherwise unremarkable.   EGK 06/09/19: NSR. Rate 82. Nonspecific T wave abnormality. )       Anesthesia Quick Evaluation

## 2020-04-13 NOTE — Progress Notes (Signed)
Anesthesia Chart Review:  Follows with cardiology for hx of HTN, palpitations. She had a remote cath in 2005 showing normal coronaries. Echo in 2008 echo showed normal left ventricular ejection fraction, borderline left atrial enlargement and borderline criteria for mitral valve prolapse, mild mitral insufficiency. Nuclear stress 2010 was normal. Last seen by Dr. Sallyanne Kuster 06/09/19, doing well at that time. Losartan was increased due to HTN not optimally controlled. She was advised to followup in 1 year.   Preop labs reviewed. DMII well controlled with A1c 6.5. Otherwise unremarkable.   EGK 06/09/19: NSR. Rate 82. Nonspecific T wave abnormality.    Tracy Knight [facility] Short Stay Center/Anesthesiology Phone [phone] 04/13/2020 10:23 AM

## 2020-04-16 ENCOUNTER — Encounter (HOSPITAL_COMMUNITY)
Admission: RE | Disposition: A | Discharge status: Home / Self Care | Payer: Self-pay | Source: Home / Self Care | Attending: Plastic Surgery

## 2020-04-16 ENCOUNTER — Inpatient Hospital Stay (HOSPITAL_COMMUNITY): Payer: BC Managed Care – PPO | Admitting: Certified Registered Nurse Anesthetist

## 2020-04-16 ENCOUNTER — Inpatient Hospital Stay (HOSPITAL_COMMUNITY): Payer: BC Managed Care – PPO | Admitting: Physician Assistant

## 2020-04-16 ENCOUNTER — Inpatient Hospital Stay (HOSPITAL_COMMUNITY)
Admission: RE | Admit: 2020-04-16 | Discharge: 2020-04-17 | DRG: 583 | Disposition: A | Discharge status: Home / Self Care | Payer: BC Managed Care – PPO | Attending: Plastic Surgery | Admitting: Plastic Surgery

## 2020-04-16 ENCOUNTER — Encounter (HOSPITAL_COMMUNITY): Payer: Self-pay | Admitting: Plastic Surgery

## 2020-04-16 ENCOUNTER — Other Ambulatory Visit: Payer: Self-pay

## 2020-04-16 DIAGNOSIS — Z421 Encounter for breast reconstruction following mastectomy: Secondary | ICD-10-CM | POA: Diagnosis not present

## 2020-04-16 DIAGNOSIS — J45909 Unspecified asthma, uncomplicated: Secondary | ICD-10-CM | POA: Diagnosis not present

## 2020-04-16 DIAGNOSIS — Z79899 Other long term (current) drug therapy: Secondary | ICD-10-CM

## 2020-04-16 DIAGNOSIS — Z923 Personal history of irradiation: Secondary | ICD-10-CM | POA: Diagnosis not present

## 2020-04-16 DIAGNOSIS — E785 Hyperlipidemia, unspecified: Secondary | ICD-10-CM | POA: Diagnosis present

## 2020-04-16 DIAGNOSIS — Z170 Estrogen receptor positive status [ER+]: Secondary | ICD-10-CM | POA: Diagnosis not present

## 2020-04-16 DIAGNOSIS — C50812 Malignant neoplasm of overlapping sites of left female breast: Secondary | ICD-10-CM | POA: Diagnosis not present

## 2020-04-16 DIAGNOSIS — R590 Localized enlarged lymph nodes: Secondary | ICD-10-CM | POA: Diagnosis present

## 2020-04-16 DIAGNOSIS — I10 Essential (primary) hypertension: Secondary | ICD-10-CM | POA: Diagnosis not present

## 2020-04-16 DIAGNOSIS — Z853 Personal history of malignant neoplasm of breast: Secondary | ICD-10-CM | POA: Diagnosis not present

## 2020-04-16 DIAGNOSIS — Z9012 Acquired absence of left breast and nipple: Secondary | ICD-10-CM | POA: Diagnosis not present

## 2020-04-16 DIAGNOSIS — N62 Hypertrophy of breast: Secondary | ICD-10-CM | POA: Diagnosis not present

## 2020-04-16 DIAGNOSIS — Z483 Aftercare following surgery for neoplasm: Secondary | ICD-10-CM | POA: Diagnosis not present

## 2020-04-16 DIAGNOSIS — N6011 Diffuse cystic mastopathy of right breast: Secondary | ICD-10-CM | POA: Diagnosis not present

## 2020-04-16 DIAGNOSIS — D099 Carcinoma in situ, unspecified: Secondary | ICD-10-CM | POA: Diagnosis not present

## 2020-04-16 DIAGNOSIS — E119 Type 2 diabetes mellitus without complications: Secondary | ICD-10-CM | POA: Diagnosis not present

## 2020-04-16 HISTORY — PX: REMOVAL OF TISSUE EXPANDER AND PLACEMENT OF IMPLANT: SHX6457

## 2020-04-16 HISTORY — PX: BREAST REDUCTION SURGERY: SHX8

## 2020-04-16 HISTORY — PX: BREAST RECONSTRUCTION: SHX9

## 2020-04-16 HISTORY — PX: LATISSIMUS FLAP TO BREAST: SHX5357

## 2020-04-16 LAB — GLUCOSE, CAPILLARY
Glucose-Capillary: 130 mg/dL — ABNORMAL HIGH (ref 70–99)
Glucose-Capillary: 177 mg/dL — ABNORMAL HIGH (ref 70–99)
Glucose-Capillary: 174 mg/dL — ABNORMAL HIGH (ref 70–99)

## 2020-04-16 SURGERY — REMOVAL, TISSUE EXPANDER, BREAST, WITH IMPLANT INSERTION
Anesthesia: General | Site: Chest | Laterality: Right

## 2020-04-16 MED ORDER — DEXAMETHASONE SODIUM PHOSPHATE 10 MG/ML IJ SOLN
INTRAMUSCULAR | Status: DC | PRN
  Administered 2020-04-16: 5 mg via INTRAVENOUS

## 2020-04-16 MED ORDER — ROCURONIUM BROMIDE 10 MG/ML (PF) SYRINGE
PREFILLED_SYRINGE | INTRAVENOUS | Status: AC
  Filled 2020-04-16: qty 10

## 2020-04-16 MED ORDER — ONDANSETRON HCL 4 MG/2ML IJ SOLN: 4.0000 mg | Freq: Four times a day (QID) | INTRAMUSCULAR | Status: DC | PRN

## 2020-04-16 MED ORDER — POTASSIUM CHLORIDE IN NACL 20-0.45 MEQ/L-% IV SOLN
INTRAVENOUS | Status: DC
  Filled 2020-04-16 (×2): qty 1000

## 2020-04-16 MED ORDER — ANASTROZOLE 1 MG PO TABS
1.0000 mg | ORAL_TABLET | Freq: Every day | ORAL | Status: DC
  Administered 2020-04-17: 1 mg via ORAL
  Filled 2020-04-16: qty 1

## 2020-04-16 MED ORDER — LACTATED RINGERS IV SOLN: INTRAVENOUS | Status: DC

## 2020-04-16 MED ORDER — KETOROLAC TROMETHAMINE 30 MG/ML IJ SOLN
30.0000 mg | Freq: Three times a day (TID) | INTRAMUSCULAR | Status: DC
  Administered 2020-04-16 – 2020-04-17 (×2): 30 mg via INTRAVENOUS
  Filled 2020-04-16 (×2): qty 1

## 2020-04-16 MED ORDER — INSULIN ASPART 100 UNIT/ML ~~LOC~~ SOLN
0.0000 [IU] | SUBCUTANEOUS | Status: DC
  Administered 2020-04-16: 4 [IU] via SUBCUTANEOUS
  Administered 2020-04-17 (×3): 2 [IU] via SUBCUTANEOUS

## 2020-04-16 MED ORDER — MONTELUKAST SODIUM 10 MG PO TABS: 10.0000 mg | ORAL_TABLET | Freq: Every day | ORAL | Status: DC

## 2020-04-16 MED ORDER — LOSARTAN POTASSIUM 50 MG PO TABS
100.0000 mg | ORAL_TABLET | Freq: Every day | ORAL | Status: DC
  Administered 2020-04-16 – 2020-04-17 (×2): 100 mg via ORAL
  Filled 2020-04-16 (×3): qty 2

## 2020-04-16 MED ORDER — CEFAZOLIN SODIUM-DEXTROSE 2-4 GM/100ML-% IV SOLN
2.0000 g | INTRAVENOUS | Status: AC
  Administered 2020-04-16 (×2): 2 g via INTRAVENOUS
  Filled 2020-04-16: qty 100

## 2020-04-16 MED ORDER — ONDANSETRON HCL 4 MG/2ML IJ SOLN
INTRAMUSCULAR | Status: DC | PRN
  Administered 2020-04-16: 4 mg via INTRAVENOUS

## 2020-04-16 MED ORDER — SODIUM CHLORIDE 0.9% FLUSH
INTRAVENOUS | Status: DC | PRN
  Administered 2020-04-16: 15 mL
  Administered 2020-04-16: 5 mL

## 2020-04-16 MED ORDER — CHLORHEXIDINE GLUCONATE CLOTH 2 % EX PADS: 6.0000 | MEDICATED_PAD | Freq: Once | CUTANEOUS | Status: DC

## 2020-04-16 MED ORDER — LIDOCAINE 2% (20 MG/ML) 5 ML SYRINGE
INTRAMUSCULAR | Status: AC
  Filled 2020-04-16: qty 5

## 2020-04-16 MED ORDER — DIPHENHYDRAMINE HCL 25 MG PO CAPS: 25.0000 mg | ORAL_CAPSULE | Freq: Four times a day (QID) | ORAL | Status: DC | PRN

## 2020-04-16 MED ORDER — FENTANYL CITRATE (PF) 100 MCG/2ML IJ SOLN: 25.0000 ug | INTRAMUSCULAR | Status: DC | PRN

## 2020-04-16 MED ORDER — OXYCODONE HCL 5 MG PO TABS: 5.0000 mg | ORAL_TABLET | Freq: Once | ORAL | Status: DC | PRN

## 2020-04-16 MED ORDER — MIDAZOLAM HCL 2 MG/2ML IJ SOLN
INTRAMUSCULAR | Status: AC
  Filled 2020-04-16: qty 2

## 2020-04-16 MED ORDER — OXYCODONE HCL 5 MG PO TABS: 5.0000 mg | ORAL_TABLET | ORAL | Status: DC | PRN

## 2020-04-16 MED ORDER — ONDANSETRON HCL 4 MG/2ML IJ SOLN
INTRAMUSCULAR | Status: AC
  Filled 2020-04-16: qty 2

## 2020-04-16 MED ORDER — VITAMIN B-12 2500 MCG SL SUBL: 2500.0000 ug | SUBLINGUAL_TABLET | Freq: Every day | SUBLINGUAL | Status: DC

## 2020-04-16 MED ORDER — FENTANYL CITRATE (PF) 250 MCG/5ML IJ SOLN
INTRAMUSCULAR | Status: AC
  Filled 2020-04-16: qty 5

## 2020-04-16 MED ORDER — PROPOFOL 10 MG/ML IV BOLUS
INTRAVENOUS | Status: DC | PRN
  Administered 2020-04-16: 150 mg via INTRAVENOUS
  Administered 2020-04-16: 50 mg via INTRAVENOUS

## 2020-04-16 MED ORDER — METHOCARBAMOL 500 MG PO TABS: 500.0000 mg | ORAL_TABLET | Freq: Four times a day (QID) | ORAL | Status: DC | PRN

## 2020-04-16 MED ORDER — CEFAZOLIN SODIUM-DEXTROSE 1-4 GM/50ML-% IV SOLN
1.0000 g | Freq: Three times a day (TID) | INTRAVENOUS | Status: DC
  Administered 2020-04-16 – 2020-04-17 (×2): 1 g via INTRAVENOUS
  Filled 2020-04-16 (×3): qty 50

## 2020-04-16 MED ORDER — DIPHENHYDRAMINE HCL 50 MG/ML IJ SOLN: 25.0000 mg | Freq: Four times a day (QID) | INTRAMUSCULAR | Status: DC | PRN

## 2020-04-16 MED ORDER — HEPARIN SODIUM (PORCINE) 5000 UNIT/ML IJ SOLN
5000.0000 [IU] | Freq: Once | INTRAMUSCULAR | Status: AC
  Administered 2020-04-16: 5000 [IU] via SUBCUTANEOUS
  Filled 2020-04-16: qty 1

## 2020-04-16 MED ORDER — 0.9 % SODIUM CHLORIDE (POUR BTL) OPTIME
TOPICAL | Status: DC | PRN
  Administered 2020-04-16 (×3): 1000 mL

## 2020-04-16 MED ORDER — ALBUTEROL SULFATE HFA 108 (90 BASE) MCG/ACT IN AERS
2.0000 | INHALATION_SPRAY | RESPIRATORY_TRACT | Status: DC | PRN
  Filled 2020-04-16: qty 6.7

## 2020-04-16 MED ORDER — DILTIAZEM HCL ER BEADS 240 MG PO CP24
360.0000 mg | ORAL_CAPSULE | Freq: Every day | ORAL | Status: DC
  Filled 2020-04-16: qty 1

## 2020-04-16 MED ORDER — SUGAMMADEX SODIUM 200 MG/2ML IV SOLN
INTRAVENOUS | Status: DC | PRN
  Administered 2020-04-16: 200 mg via INTRAVENOUS

## 2020-04-16 MED ORDER — ENOXAPARIN SODIUM 40 MG/0.4ML ~~LOC~~ SOLN
40.0000 mg | SUBCUTANEOUS | Status: DC
  Administered 2020-04-17: 40 mg via SUBCUTANEOUS
  Filled 2020-04-16: qty 0.4

## 2020-04-16 MED ORDER — BUPIVACAINE LIPOSOME 1.3 % IJ SUSP
20.0000 mL | INTRAMUSCULAR | Status: AC
  Administered 2020-04-16: 5 mL
  Filled 2020-04-16: qty 20

## 2020-04-16 MED ORDER — PHENYLEPHRINE HCL-NACL 10-0.9 MG/250ML-% IV SOLN
INTRAVENOUS | Status: DC | PRN
  Administered 2020-04-16: 25 ug/min via INTRAVENOUS

## 2020-04-16 MED ORDER — PROPOFOL 10 MG/ML IV BOLUS
INTRAVENOUS | Status: AC
  Filled 2020-04-16: qty 40

## 2020-04-16 MED ORDER — HYDROMORPHONE HCL 1 MG/ML IJ SOLN: 0.5000 mg | INTRAMUSCULAR | Status: DC | PRN

## 2020-04-16 MED ORDER — ONDANSETRON 4 MG PO TBDP: 4.0000 mg | ORAL_TABLET | Freq: Four times a day (QID) | ORAL | Status: DC | PRN

## 2020-04-16 MED ORDER — ORAL CARE MOUTH RINSE: 15.0000 mL | Freq: Once | OROMUCOSAL | Status: AC

## 2020-04-16 MED ORDER — CHLORHEXIDINE GLUCONATE 0.12 % MT SOLN
15.0000 mL | Freq: Once | OROMUCOSAL | Status: AC
  Administered 2020-04-16: 15 mL via OROMUCOSAL
  Filled 2020-04-16: qty 15

## 2020-04-16 MED ORDER — TRIAMTERENE-HCTZ 37.5-25 MG PO TABS
1.0000 | ORAL_TABLET | Freq: Every day | ORAL | Status: DC
  Administered 2020-04-16 – 2020-04-17 (×2): 1 via ORAL
  Filled 2020-04-16 (×2): qty 1

## 2020-04-16 MED ORDER — LIDOCAINE 2% (20 MG/ML) 5 ML SYRINGE
INTRAMUSCULAR | Status: DC | PRN
  Administered 2020-04-16: 50 mg via INTRAVENOUS

## 2020-04-16 MED ORDER — TERBINAFINE HCL 250 MG PO TABS
500.0000 mg | ORAL_TABLET | Freq: Every day | ORAL | Status: DC
  Administered 2020-04-16 – 2020-04-17 (×2): 500 mg via ORAL
  Filled 2020-04-16 (×2): qty 2

## 2020-04-16 MED ORDER — FENTANYL CITRATE (PF) 250 MCG/5ML IJ SOLN
INTRAMUSCULAR | Status: DC | PRN
  Administered 2020-04-16: 100 ug via INTRAVENOUS
  Administered 2020-04-16: 50 ug via INTRAVENOUS

## 2020-04-16 MED ORDER — VITAMIN B-12 1000 MCG PO TABS
2500.0000 ug | ORAL_TABLET | Freq: Every day | ORAL | Status: DC
  Administered 2020-04-16 – 2020-04-17 (×2): 2500 ug via ORAL
  Filled 2020-04-16 (×2): qty 3

## 2020-04-16 MED ORDER — BUPIVACAINE LIPOSOME 1.3 % IJ SUSP
INTRAMUSCULAR | Status: DC | PRN
  Administered 2020-04-16: 15 mL

## 2020-04-16 MED ORDER — OXYCODONE HCL 5 MG/5ML PO SOLN: 5.0000 mg | Freq: Once | ORAL | Status: DC | PRN

## 2020-04-16 MED ORDER — DEXAMETHASONE SODIUM PHOSPHATE 10 MG/ML IJ SOLN
INTRAMUSCULAR | Status: AC
  Filled 2020-04-16: qty 1

## 2020-04-16 MED ORDER — SODIUM CHLORIDE 0.9 % IV SOLN
INTRAVENOUS | Status: AC
  Administered 2020-04-16: 500 mL
  Filled 2020-04-16: qty 10

## 2020-04-16 MED ORDER — PRAVASTATIN SODIUM 40 MG PO TABS
40.0000 mg | ORAL_TABLET | Freq: Every day | ORAL | Status: DC
  Administered 2020-04-16 – 2020-04-17 (×2): 40 mg via ORAL
  Filled 2020-04-16 (×2): qty 1

## 2020-04-16 MED ORDER — MIDAZOLAM HCL 2 MG/2ML IJ SOLN
INTRAMUSCULAR | Status: DC | PRN
  Administered 2020-04-16: 2 mg via INTRAVENOUS

## 2020-04-16 MED ORDER — ACETAMINOPHEN 500 MG PO TABS
1000.0000 mg | ORAL_TABLET | ORAL | Status: AC
  Administered 2020-04-16: 1000 mg via ORAL
  Filled 2020-04-16: qty 2

## 2020-04-16 MED ORDER — CELECOXIB 200 MG PO CAPS
200.0000 mg | ORAL_CAPSULE | ORAL | Status: AC
  Administered 2020-04-16: 200 mg via ORAL
  Filled 2020-04-16: qty 1

## 2020-04-16 MED ORDER — ROCURONIUM BROMIDE 10 MG/ML (PF) SYRINGE
PREFILLED_SYRINGE | INTRAVENOUS | Status: DC | PRN
  Administered 2020-04-16: 60 mg via INTRAVENOUS
  Administered 2020-04-16: 40 mg via INTRAVENOUS

## 2020-04-16 SURGICAL SUPPLY — 95 items
ADH SKN CLS APL DERMABOND .7 (GAUZE/BANDAGES/DRESSINGS) ×9
APL PRP STRL LF DISP 70% ISPRP (MISCELLANEOUS) ×6
APPLIER CLIP 11 MED OPEN (CLIP) ×5
APPLIER CLIP 9.375 MED OPEN (MISCELLANEOUS) ×5
APR CLP MED 11 20 MLT OPN (CLIP) ×3
APR CLP MED 9.3 20 MLT OPN (MISCELLANEOUS) ×3
BAG DECANTER FOR FLEXI CONT (MISCELLANEOUS) ×2 IMPLANT
BINDER BREAST LRG (GAUZE/BANDAGES/DRESSINGS) IMPLANT
BINDER BREAST MEDIUM (GAUZE/BANDAGES/DRESSINGS) IMPLANT
BINDER BREAST XLRG (GAUZE/BANDAGES/DRESSINGS) IMPLANT
BINDER BREAST XXLRG (GAUZE/BANDAGES/DRESSINGS) ×2 IMPLANT
BLADE SURG 10 STRL SS (BLADE) ×20 IMPLANT
BLADE SURG 15 STRL LF DISP TIS (BLADE) IMPLANT
BLADE SURG 15 STRL SS (BLADE)
BNDG COHESIVE 4X5 TAN STRL (GAUZE/BANDAGES/DRESSINGS) ×2 IMPLANT
CANISTER SUCT 3000ML PPV (MISCELLANEOUS) ×5 IMPLANT
CHLORAPREP W/TINT 26 (MISCELLANEOUS) ×7 IMPLANT
CLIP APPLIE 11 MED OPEN (CLIP) IMPLANT
CLIP APPLIE 9.375 MED OPEN (MISCELLANEOUS) ×3 IMPLANT
CLOSURE STERI-STRIP 1/2X4 (GAUZE/BANDAGES/DRESSINGS) ×4
CLOSURE WOUND 1/2 X4 (GAUZE/BANDAGES/DRESSINGS)
CLSR STERI-STRIP ANTIMIC 1/2X4 (GAUZE/BANDAGES/DRESSINGS) ×4 IMPLANT
COVER MAYO STAND STRL (DRAPES) ×7 IMPLANT
COVER SURGICAL LIGHT HANDLE (MISCELLANEOUS) ×7 IMPLANT
COVER WAND RF STERILE (DRAPES) ×5 IMPLANT
DECANTER SPIKE VIAL GLASS SM (MISCELLANEOUS) ×3 IMPLANT
DERMABOND ADVANCED (GAUZE/BANDAGES/DRESSINGS) ×6
DERMABOND ADVANCED .7 DNX12 (GAUZE/BANDAGES/DRESSINGS) ×6 IMPLANT
DRAIN CHANNEL 15F RND FF W/TCR (WOUND CARE) ×4 IMPLANT
DRAIN CHANNEL 19F RND (DRAIN) IMPLANT
DRAPE HALF SHEET 40X57 (DRAPES) ×12 IMPLANT
DRAPE INCISE 23X17 IOBAN STRL (DRAPES)
DRAPE INCISE 23X17 STRL (DRAPES) IMPLANT
DRAPE INCISE IOBAN 23X17 STRL (DRAPES) IMPLANT
DRAPE INCISE IOBAN 85X60 (DRAPES) ×5 IMPLANT
DRAPE ORTHO SPLIT 77X108 STRL (DRAPES) ×20
DRAPE SURG 17X23 STRL (DRAPES) ×2 IMPLANT
DRAPE SURG ORHT 6 SPLT 77X108 (DRAPES) ×12 IMPLANT
DRAPE UTILITY XL STRL (DRAPES) ×5 IMPLANT
DRAPE WARM FLUID 44X44 (DRAPES) ×5 IMPLANT
DRSG PAD ABDOMINAL 8X10 ST (GAUZE/BANDAGES/DRESSINGS) ×10 IMPLANT
ELECT BLADE 4.0 EZ CLEAN MEGAD (MISCELLANEOUS) ×5
ELECT BLADE 6.5 EXT (BLADE) ×5 IMPLANT
ELECT COATED BLADE 2.86 ST (ELECTRODE) ×5 IMPLANT
ELECT REM PT RETURN 9FT ADLT (ELECTROSURGICAL) ×5
ELECTRODE BLDE 4.0 EZ CLN MEGD (MISCELLANEOUS) ×3 IMPLANT
ELECTRODE REM PT RTRN 9FT ADLT (ELECTROSURGICAL) ×3 IMPLANT
EVACUATOR SILICONE 100CC (DRAIN) ×10 IMPLANT
GAUZE SPONGE 4X4 12PLY STRL LF (GAUZE/BANDAGES/DRESSINGS) IMPLANT
GAUZE XEROFORM 5X9 LF (GAUZE/BANDAGES/DRESSINGS) IMPLANT
GLOVE BIO SURGEON STRL SZ 6 (GLOVE) ×15 IMPLANT
GOWN STRL REUS W/ TWL LRG LVL3 (GOWN DISPOSABLE) ×6 IMPLANT
GOWN STRL REUS W/TWL LRG LVL3 (GOWN DISPOSABLE) ×10
IMPL BREAST P6.4XRND XFULL 580 (Breast) IMPLANT
IMPL BRST P6.4XRND XFULL 580CC (Breast) ×3 IMPLANT
IMPLANT BREAST GEL 580CC (Breast) ×5 IMPLANT
KIT BASIN OR (CUSTOM PROCEDURE TRAY) ×5 IMPLANT
MARKER SKIN DUAL TIP RULER LAB (MISCELLANEOUS) ×5 IMPLANT
NDL HYPO 25GX1X1/2 BEV (NEEDLE) ×3 IMPLANT
NEEDLE HYPO 25GX1X1/2 BEV (NEEDLE) ×5 IMPLANT
NS IRRIG 1000ML POUR BTL (IV SOLUTION) ×10 IMPLANT
PACK GENERAL/GYN (CUSTOM PROCEDURE TRAY) ×5 IMPLANT
PAD ABD 8X10 STRL (GAUZE/BANDAGES/DRESSINGS) ×6 IMPLANT
PAD ARMBOARD 7.5X6 YLW CONV (MISCELLANEOUS) ×15 IMPLANT
PENCIL BUTTON HOLSTER BLD 10FT (ELECTRODE) IMPLANT
PIN SAFETY STERILE (MISCELLANEOUS) ×5 IMPLANT
SET COLLECT BLD 21X3/4 12 (NEEDLE) IMPLANT
SIZER BREAST REUSE GEL 560CC (SIZER) ×5
SIZER BREAST REUSE GEL 580CC (SIZER) ×5
SIZER BRST REUSE GEL 560CC (SIZER) IMPLANT
SIZER BRST REUSE GEL 580CC (SIZER) IMPLANT
SPONGE LAP 18X18 RF (DISPOSABLE) ×10 IMPLANT
STAPLER VISISTAT 35W (STAPLE) ×7 IMPLANT
STOCKINETTE IMPERVIOUS 9X36 MD (GAUZE/BANDAGES/DRESSINGS) IMPLANT
STRIP CLOSURE SKIN 1/2X4 (GAUZE/BANDAGES/DRESSINGS) ×6 IMPLANT
SUT ETHILON 2 0 FS 18 (SUTURE) ×10 IMPLANT
SUT MNCRL AB 4-0 PS2 18 (SUTURE) ×18 IMPLANT
SUT PDS AB 2-0 CT2 27 (SUTURE) ×14 IMPLANT
SUT VIC AB 3-0 PS1 18 (SUTURE) ×20
SUT VIC AB 3-0 PS1 18X BRD (SUTURE) IMPLANT
SUT VIC AB 3-0 PS1 18XBRD (SUTURE) ×12 IMPLANT
SUT VIC AB 3-0 SH 27 (SUTURE) ×5
SUT VIC AB 3-0 SH 27X BRD (SUTURE) ×6 IMPLANT
SUT VIC AB 4-0 PS2 18 (SUTURE) ×6 IMPLANT
SUT VICRYL 4-0 PS2 18IN ABS (SUTURE) ×8 IMPLANT
SUT VLOC 180 0 24IN GS25 (SUTURE) ×2 IMPLANT
SYR 50ML SLIP (SYRINGE) IMPLANT
SYR BULB IRRIG 60ML STRL (SYRINGE) ×5 IMPLANT
SYR CONTROL 10ML LL (SYRINGE) ×5 IMPLANT
TOWEL GREEN STERILE (TOWEL DISPOSABLE) ×5 IMPLANT
TOWEL GREEN STERILE FF (TOWEL DISPOSABLE) ×5 IMPLANT
TRAY FOLEY MTR SLVR 14FR STAT (SET/KITS/TRAYS/PACK) ×5 IMPLANT
TUBE CONNECTING 12'X1/4 (SUCTIONS) ×1
TUBE CONNECTING 12X1/4 (SUCTIONS) ×4 IMPLANT
WATER STERILE IRR 1000ML POUR (IV SOLUTION) ×3 IMPLANT

## 2020-04-16 NOTE — Op Note (Signed)
Operative Note   DATE OF OPERATION: 10.18.2021  LOCATION: [facility] Main OR-inpatient  SURGICAL DIVISION: Plastic Surgery  PREOPERATIVE DIAGNOSES:  1. History breast cancer 2. Acquired absence breast 3. History therapeutic radiation  POSTOPERATIVE DIAGNOSES:  same  PROCEDURE:  1. Removal left chest tissue expander and placement silicone implant 2. Latissimus dorsi flap to left chest 3. Right breast reduction  SURGEON: Irene Limbo MD MBA  ASSISTANT: S Hitchcock RNFA  ANESTHESIA:  General.   EBL: 403 ml  COMPLICATIONS: None immediate.   INDICATIONS FOR PROCEDURE:  The patient, Tracy Knight, is a 59 y.o. female born on March 22, 1961, is here for staged breast reconstruction following skin reduction pattern mastectomy with immediate prepectoral expander acellular dermis reconstruction. Patient received adjuvant radiation. We have elected for left latissimus flap.    FINDINGS: Right breast reduction 407 g. Natrelle Inspira Smooth Round Extra Projection 580 ml implant placed in left chest, REF SRX-580 [id number]  DESCRIPTION OF PROCEDURE:  The patient's operative site was marked with the patient in the preoperative areaincluding chest midline, sternal notch, breast meridians, anterior axillary lines. Over right breast location of nipple position marked at location inframammary fold by palpation through breast. Breast displaced medially and laterally against meridian, and with aid with Wise pattern marker, vertical limbs marked (6 cm). SQ heparin administered. The patientwas taken to the operating room. SCDs were placed and IV antibiotics were given. Foley catheter placed. Patient placed into right lateral position. The patient's operative site was prepped and draped in a sterile fashion. A time out was performed and all information was confirmed to be correct.   Incision made in left IMF scar and carried through superficial fascia and ADM. Expander removed. Capsulotomies performed  circumferentially and radial scoring lower pole acellular dermis completed. Complete incorporation ADM noted.   Incision made surrounding skin paddle designed over leftback. Skin and superficial fascia elevated off surface of latissimus muscle and subcutaneous tunnel dissected to axilla. Anterior border of latissimus identified and elevated. Muscle divided inferiorly at superior iliac spine. Submuscular dissection completed toward midline back and toward origin. Thoracodorsal nerve wasidentified anddivided. Flap rotated into anterior chest cavity. Back irrigated and hemostasis ensured. Exparel infiltrated. 15Fr drain placed and secured with 2-0 nylon. 2-0 PDS used to placed quilting sutures from elevated skin flaps to chest wall. Incision closed with 0 V lock suture in superficial fascia and dermis. Skin closure completed with 4-0 monocryl subcuticular and tissue glue applied. The latissimus flap was placed in chest cavity, mastectomy flaps stapled over flap. The patient was placed in supine position and re prepped and draped.   The flap muscle was redraped withinleftchest.The entire skin paddle brought with latissimus was removed.The muscle was secured to pectoralis and serratus muscle with interrupted 2-0 PDS. 95 Fr JP drain placed in breast cavity and secured to chest with 2-0 nylon. A sizer was placed beneath latissimus muscle.  In supine position, inframammary fold marked over right and superior medial pedicle designed. NAC marked with 42 mm cookie cutter and pedicle de epithelialized. Superiorly based pedicle maintained and medial and lateral flaps developed. Lower pole annd lateral breast tissue excised. Cavity irrigated. Hemostasis obtained. Local anesthetic infiltrated. 15 Fr JP placed and secured with 2-0 nylon. Breast tailor tacked closed.  Patient brought to upright sitting position and Natrelle Extra Projection 580 implant selected. Patient returned to supine position.  Left chest  cavity irrigated with solution containing Ancef, gentamicin,and Betadine.Hemostasis obtained.Left chest implant placed below latissimus muscle.Remainder oflatissimus muscle sutured to chest wall withPDS  sutures.Closure incision completed with 3-0 and 4-0 vicryl in dermis, 4-0 monocryl for skin closure. Incidental cautery injury incurred during elevation mastectomy flap from pectoralis muscle over upper outer quadrant. Skin margins of this excised. Closure completed with 3-0 vicryl in dermis and 4-0 monocryl subcuticular skin closure.  Over right breast, closure completed with 3-0 vicryl in dermis along vertical closure and inframammary fold. NAC inset with 4-0 vicryl in dermis. Skin closure completed with 4-0 monocryl subcuticular closure.  Dermabond applied to all incisions. Dry dressing, breast binder applied.  The patient was allowed to wake from anesthesia, extubated and taken to the recovery room in satisfactory condition.   SPECIMENS: right breast reduction  DRAINS: 15 Fr JP in right breast, 19 Fr JP in left breast reconstruction, 15 Fr JP in left back

## 2020-04-16 NOTE — H&P (Signed)
Subjective:   Patient Tracy Knight a 59 y.o.female.  HPI  1 yearpost op left mastectomy with immediate expander based reconstruction. Plan second stage breast reconstruction- this was delayed secondary to COVID restrictions.  Presented following screening MMG with possible left breast mass. MMG/US showed a 1.5cm left breast mass at the 2 o'clock position 7cmfn, indeterminate left axillary lymphadenopathy with cortical thickening up to 46mm, and possible distortion in th upper inner left breast. Biopsies labeled left breast 2 o clock and left LIQ showed invasive mammary carcinoma with mammary carcinoma in situ in the upper outer and upper inner left breast, ER/PR+ HER2 -. Biopsy LN benign. E cadherin negative suggesting lobular.  Final pathology 1.6 cm ILC, margins clear, total 5 SLN, one with micrometastatic carcinoma with extranodal extension, one with isolated tumor cells.  Mammaprint low risk. On Arimidex. Completed adjuvant RT 12.7.20  Prior 42 C. 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 Lives with husband andteenagedaughter.     Objective:  Physical Exam Cardiovascular:Regular rhythmand normal heart sounds.  Pulmonary/Chest:Effort normaland breath sounds normal.  Chest: No masses bilateral Right grade 3 ptosis SN to npple R 32 BW R 20 cm  Nipple to IMF R 11 cm left chest expanded, hyperpigmentation, depression contour superior and lateral to expander  Assessment:   Left breast ca overlapping sites ER+ S/p left SRM, SLN, prepectoral TE/ADM (alloderm) reconstruction Adjuvant radiation  Plan:   Plan removal left chest TE and placement implant, left latissimus dorsi flap to left chest, right breast reduction.  Reviewed radiation will increase risks reconstruction  includingchance complications including contracture wound healing problems. Counseled encouraging data with prepectoral position, use ADM with regards to radiation. Howeversome type of autologous tissue reconstruction post radiation can decrease risks back to baseline. Counseled options of implant exchange alone,LD flap over implant, or referral to microsurgeon to discuss other autologous options.Reviewed donor site risks including weakness, seroma. She declinedreferral to microsurgeon. She has elected for latissimus flap on left. Reviewed implant will be located over pectoralis muscle and beneath latissimus muscle. Reviewed back scar, drains, overnight stay.Reviewed function of LD muscle, risk weakness on this side and PT to get all ROM back is common- this could delay her return to work longer.  As in prepectoral position recommend capacity filled or HCG implants which may offer more stable shape and less rippling over saline. Reviewed FDA recommendations regarding surveillance silicone implants.She has elected forsilicone, plans smooth round capacity filled.  Over right breat plan reduction. Reviewed drain, anchor type scars. Reviewed asymmetry one should expect with unilateral implant reconstruction, goal would be symmetric volume and cleavage in bra. Reviewed recurrent ptosis with aging.  Discussed fat grafting purpose. Will defer this for present, reviewed can be done in future if needed.  Completed ASPS consent for latissimus flap and physician patient checklist for implant placement  Rx foroxycodone, Robaxin, and Bactrim given    Natrelle 133S-FV-13-T 500 ml tissue expander  fill volume 495 ml saline

## 2020-04-16 NOTE — Anesthesia Procedure Notes (Signed)
Procedure Name: Intubation Date/Time: 04/16/2020 7:16 AM Performed by: Valda Favia, CRNA Pre-anesthesia Checklist: Patient identified, Emergency Drugs available, Suction available and Patient being monitored Patient Re-evaluated:Patient Re-evaluated prior to induction Oxygen Delivery Method: Circle System Utilized Preoxygenation: Pre-oxygenation with 100% oxygen Induction Type: IV induction Ventilation: Mask ventilation without difficulty and Oral airway inserted - appropriate to patient size Laryngoscope Size: Mac and 4 Grade View: Grade II Tube type: Oral Tube size: 7.0 mm Number of attempts: 1 Airway Equipment and Method: Stylet and Oral airway Placement Confirmation: ETT inserted through vocal cords under direct vision,  positive ETCO2 and breath sounds checked- equal and bilateral Secured at: 20 cm Tube secured with: Tape Dental Injury: Teeth and Oropharynx as per pre-operative assessment

## 2020-04-16 NOTE — Transfer of Care (Signed)
Immediate Anesthesia Transfer of Care Note  Patient: Tracy Knight  Procedure(s) Performed: REMOVAL OF LEFT CHEST TISSUE EXPANDER AND PLACEMENT OF IMPLANT (Left Breast) LEFT LATISSIMUS DORSI FLAP TO LEFT CHEST (Left Chest) RIGHT BREAST REDUCTION (Right Breast)  Patient Location: PACU  Anesthesia Type:General  Level of Consciousness: drowsy  Airway & Oxygen Therapy: Patient Spontanous Breathing and Patient connected to face mask oxygen  Post-op Assessment: Report given to RN and Post -op Vital signs reviewed and stable  Post vital signs: Reviewed and stable  Last Vitals:  Vitals Value Taken Time  BP 129/58 04/16/20 1228  Temp    Pulse 61 04/16/20 1232  Resp 13 04/16/20 1232  SpO2 100 % 04/16/20 1232  Vitals shown include unvalidated device data.  Last Pain:  Vitals:   04/16/20 1228  TempSrc:   PainSc: (P) Asleep      Patients Stated Pain Goal: 5 (73/73/66 8159)  Complications: No complications documented.

## 2020-04-17 ENCOUNTER — Encounter (HOSPITAL_COMMUNITY): Payer: Self-pay | Admitting: Plastic Surgery

## 2020-04-17 LAB — SURGICAL PATHOLOGY

## 2020-04-17 LAB — GLUCOSE, CAPILLARY
Glucose-Capillary: 121 mg/dL — ABNORMAL HIGH (ref 70–99)
Glucose-Capillary: 129 mg/dL — ABNORMAL HIGH (ref 70–99)
Glucose-Capillary: 148 mg/dL — ABNORMAL HIGH (ref 70–99)

## 2020-04-17 MED ORDER — DILTIAZEM HCL ER COATED BEADS 180 MG PO CP24
360.0000 mg | ORAL_CAPSULE | Freq: Every day | ORAL | Status: DC
  Administered 2020-04-17: 360 mg via ORAL
  Filled 2020-04-17: qty 2

## 2020-04-17 NOTE — Anesthesia Postprocedure Evaluation (Signed)
Anesthesia Post Note  Patient: Tracy Knight  Procedure(s) Performed: REMOVAL OF LEFT CHEST TISSUE EXPANDER AND PLACEMENT OF IMPLANT (Left Breast) LEFT LATISSIMUS DORSI FLAP TO LEFT CHEST (Left Chest) RIGHT BREAST REDUCTION (Right Breast)     Patient location during evaluation: PACU Anesthesia Type: General Level of consciousness: awake and alert Pain management: pain level controlled Vital Signs Assessment: post-procedure vital signs reviewed and stable Respiratory status: spontaneous breathing, nonlabored ventilation, respiratory function stable and patient connected to nasal cannula oxygen Cardiovascular status: blood pressure returned to baseline and stable Postop Assessment: no apparent nausea or vomiting Anesthetic complications: no   No complications documented.  Last Vitals:  Vitals:   04/17/20 0110 04/17/20 0506  BP: (!) 174/69 (!) 141/81  Pulse: 77 78  Resp: 16 18  Temp: 36.7 C 36.8 C  SpO2: 97% 100%    Last Pain:  Vitals:   04/17/20 0506  TempSrc: Oral  PainSc:                  [place]

## 2020-04-17 NOTE — Discharge Planning (Signed)
Pryor Curia to be D/C'd  per MD order. Discussed with the patient and all questions fully answered.  VSS, Skin clean, dry and intact without evidence of skin break down, no evidence of skin tears noted.  IV catheter discontinued intact. Site without signs and symptoms of complications. Dressing and pressure applied.  An After Visit Summary was printed and given to the patient. Patient received prescription.  D/c education completed with patient/family including follow up instructions, medication list, d/c activities limitations if indicated, with other d/c instructions as indicated by MD - patient able to verbalize understanding, all questions fully answered.   Patient instructed to return to ED, call 911, or call MD for any changes in condition.   Patient to be escorted via [place], and D/C home via private auto.

## 2020-04-17 NOTE — Discharge Summary (Signed)
Physician Discharge Summary  Patient ID: Tracy Knight MRN: [id number] DOB/AGE: May 05, 1961 58 y.o.  Admit date: 04/16/2020 Discharge date: 04/17/2020  Admission Diagnoses: History breast cancer acquired absence breast history therapeutic radiation  Discharge Diagnoses:  same  Discharged Condition: stable  Hospital Course: Post operatively patient did well with minimal pain requirement, ambulating with minimal assist and tolerated diet. Instructed on bathing and drain care.  Treatments: surgery: 1. Removal left chest tissue expander and placement silicone implant 2. Latissimus dorsi flap to left chest 3. Right breast reduction  Discharge Exam: Blood pressure (!) 141/81, pulse 78, temperature 98.2 F (36.8 C), temperature source Oral, resp. rate 18, height 5\' 5"  (1.651 m), weight 89.7 kg, SpO2 100 %. Incision/Wound: incisions intact dry back flat chest soft NAC right viable drains serosanguinous  Disposition: Discharge disposition: 01-Home or Self Care       Discharge Instructions    Call MD for:  redness, tenderness, or signs of infection (pain, swelling, bleeding, redness, odor or green/yellow discharge around incision site)   Complete by: As directed    Call MD for:  temperature >100.5   Complete by: As directed    Discharge instructions   Complete by: As directed    Ok to remove dressings and shower am 10.20.21. Soap and water ok, pat incisions dry. No creams or ointments over incisions. Do not let drains dangle in shower, attach to lanyard or similar. Strip and record drains twice daily and bring log to clinic visit.  Breast binder or soft compression bra all other times.  Ok to raise arms above shoulders for bathing and dressing.  No house yard work or exercise until cleared by MD.   Recommend ibuprofen with meals to aid with pain control. Also ok to use Tylenol for pain. Ice packs to chest for comfort. Recommend Miralax or Dulcolax as needed for constipation. Patient  received all Rx preoperatively.   Driving Restrictions   Complete by: As directed    No driving through follow up visit   Lifting restrictions   Complete by: As directed    No lifting > 5 lbs until cleared by MD   Resume previous diet   Complete by: As directed      Allergies as of 04/17/2020      Reactions   Carvedilol Other (See Comments)   Causes inc. In wheezing due to asthma      Medication List    TAKE these medications   anastrozole 1 MG tablet Commonly known as: ARIMIDEX TAKE 1 TABLET(1 MG) BY MOUTH DAILY What changed: See the new instructions.   aspirin EC 81 MG tablet Take 81 mg by mouth daily. Swallow whole.   BIOFREEZE EX Apply 1 application topically 2 (two) times daily as needed (pain).   CALCIUM-VITAMIN D PO Take 1 tablet by mouth daily.   cetirizine 10 MG tablet Commonly known as: ZYRTEC Take 10 mg by mouth daily.   diltiazem 360 MG 24 hr capsule Commonly known as: TIAZAC TAKE 1 CAPSULE BY MOUTH DAILY   fish oil-omega-3 fatty acids 1000 MG capsule Take 1,000 mg by mouth daily.   ibuprofen 200 MG tablet Commonly known as: ADVIL Take 400 mg by mouth every 6 (six) hours as needed for moderate pain.   Jentadueto 2.5-500 MG Tabs Generic drug: linaGLIPtin-metFORMIN HCl Take 1 tablet by mouth in the morning and at bedtime.   losartan 100 MG tablet Commonly known as: COZAAR Take 1 tablet (100 mg total) by mouth daily.  montelukast 10 MG tablet Commonly known as: SINGULAIR Take 10 mg by mouth daily before lunch.   multivitamin with minerals Tabs tablet Take 1 tablet by mouth daily.   pravastatin 40 MG tablet Commonly known as: PRAVACHOL TAKE 1 TABLET(40 MG) BY MOUTH DAILY What changed:   how much to take  how to take this  when to take this  additional instructions   ProAir HFA 108 (90 Base) MCG/ACT inhaler Generic drug: albuterol Inhale 2 puffs into the lungs every 4 (four) hours as needed for wheezing or shortness of breath.    Slow Release Iron 45 MG Tbcr Generic drug: Ferrous Sulfate Dried Take 45 mg by mouth 2 (two) times a week.   terbinafine 250 MG tablet Commonly known as: LAMISIL Take 500 mg by mouth daily.   triamterene-hydrochlorothiazide 37.5-25 MG tablet Commonly known as: MAXZIDE-25 Take 1 tablet by mouth daily.   TURMERIC PO Take 1 capsule by mouth daily.   Vitamin B-12 2500 MCG Subl Place 2,500 mcg under the tongue daily.   vitamin E 180 MG (400 UNITS) capsule Take 400 Units by mouth daily.       Follow-up Information    Irene Limbo, MD In 1 week.   Specialty: Plastic Surgery Why: as scheduled Contact information: [address] [place] [address] [phone]               Signed: Irene Limbo 04/17/2020, 6:46 AM

## 2020-05-07 DIAGNOSIS — Z923 Personal history of irradiation: Secondary | ICD-10-CM | POA: Diagnosis not present

## 2020-05-07 DIAGNOSIS — Z9012 Acquired absence of left breast and nipple: Secondary | ICD-10-CM | POA: Diagnosis not present

## 2020-05-07 DIAGNOSIS — L7634 Postprocedural seroma of skin and subcutaneous tissue following other procedure: Secondary | ICD-10-CM | POA: Diagnosis not present

## 2020-05-07 DIAGNOSIS — Z853 Personal history of malignant neoplasm of breast: Secondary | ICD-10-CM | POA: Diagnosis not present

## 2020-05-10 DIAGNOSIS — L7634 Postprocedural seroma of skin and subcutaneous tissue following other procedure: Secondary | ICD-10-CM | POA: Diagnosis not present

## 2020-05-10 DIAGNOSIS — Z923 Personal history of irradiation: Secondary | ICD-10-CM | POA: Diagnosis not present

## 2020-05-10 DIAGNOSIS — Z853 Personal history of malignant neoplasm of breast: Secondary | ICD-10-CM | POA: Diagnosis not present

## 2020-05-10 DIAGNOSIS — Z9012 Acquired absence of left breast and nipple: Secondary | ICD-10-CM | POA: Diagnosis not present

## 2020-05-17 DIAGNOSIS — Z853 Personal history of malignant neoplasm of breast: Secondary | ICD-10-CM | POA: Diagnosis not present

## 2020-05-17 DIAGNOSIS — Z923 Personal history of irradiation: Secondary | ICD-10-CM | POA: Diagnosis not present

## 2020-05-17 DIAGNOSIS — L7634 Postprocedural seroma of skin and subcutaneous tissue following other procedure: Secondary | ICD-10-CM | POA: Diagnosis not present

## 2020-05-17 DIAGNOSIS — Z9012 Acquired absence of left breast and nipple: Secondary | ICD-10-CM | POA: Diagnosis not present

## 2020-06-08 ENCOUNTER — Other Ambulatory Visit: Payer: Self-pay | Admitting: Cardiovascular Disease

## 2020-07-02 DIAGNOSIS — E7849 Other hyperlipidemia: Secondary | ICD-10-CM | POA: Diagnosis not present

## 2020-07-02 DIAGNOSIS — I10 Essential (primary) hypertension: Secondary | ICD-10-CM | POA: Diagnosis not present

## 2020-07-02 DIAGNOSIS — E119 Type 2 diabetes mellitus without complications: Secondary | ICD-10-CM | POA: Diagnosis not present

## 2020-07-02 DIAGNOSIS — E6609 Other obesity due to excess calories: Secondary | ICD-10-CM | POA: Diagnosis not present

## 2020-07-02 DIAGNOSIS — Z1331 Encounter for screening for depression: Secondary | ICD-10-CM | POA: Diagnosis not present

## 2020-07-02 DIAGNOSIS — Z1389 Encounter for screening for other disorder: Secondary | ICD-10-CM | POA: Diagnosis not present

## 2020-07-02 DIAGNOSIS — Z6834 Body mass index (BMI) 34.0-34.9, adult: Secondary | ICD-10-CM | POA: Diagnosis not present

## 2020-07-23 DIAGNOSIS — Z923 Personal history of irradiation: Secondary | ICD-10-CM | POA: Diagnosis not present

## 2020-07-23 DIAGNOSIS — Z9012 Acquired absence of left breast and nipple: Secondary | ICD-10-CM | POA: Diagnosis not present

## 2020-07-23 DIAGNOSIS — Z853 Personal history of malignant neoplasm of breast: Secondary | ICD-10-CM | POA: Diagnosis not present

## 2020-08-01 ENCOUNTER — Ambulatory Visit (INDEPENDENT_AMBULATORY_CARE_PROVIDER_SITE_OTHER): Payer: BC Managed Care – PPO | Admitting: Cardiovascular Disease

## 2020-08-01 ENCOUNTER — Other Ambulatory Visit: Payer: Self-pay

## 2020-08-01 ENCOUNTER — Encounter: Payer: Self-pay | Admitting: Cardiovascular Disease

## 2020-08-01 VITALS — BP 150/90 | HR 73 | Ht 65.0 in | Wt 195.0 lb

## 2020-08-01 DIAGNOSIS — E669 Obesity, unspecified: Secondary | ICD-10-CM

## 2020-08-01 DIAGNOSIS — R002 Palpitations: Secondary | ICD-10-CM | POA: Diagnosis not present

## 2020-08-01 DIAGNOSIS — E1169 Type 2 diabetes mellitus with other specified complication: Secondary | ICD-10-CM | POA: Diagnosis not present

## 2020-08-01 DIAGNOSIS — E119 Type 2 diabetes mellitus without complications: Secondary | ICD-10-CM

## 2020-08-01 DIAGNOSIS — I10 Essential (primary) hypertension: Secondary | ICD-10-CM

## 2020-08-01 DIAGNOSIS — E7800 Pure hypercholesterolemia, unspecified: Secondary | ICD-10-CM

## 2020-08-01 MED ORDER — LOSARTAN POTASSIUM 100 MG PO TABS
100.0000 mg | ORAL_TABLET | Freq: Every day | ORAL | 3 refills | Status: DC
Start: 2020-08-01 — End: 2023-05-27

## 2020-08-01 NOTE — Patient Instructions (Signed)

## 2020-08-01 NOTE — Progress Notes (Signed)
Cardiology Office Note    Date:  08/01/2020   ID:  Nesreen, Albano January 01, 1961, MRN [id number]  PCP:  Sharilyn Sites, MD  Cardiologist:   Sanda Klein, MD   Chief Complaint  Patient presents with  . Follow-up    Follow-up Yearly    History of Present Illness:  Tracy Knight is a 60 y.o. female with obesity, hyperlipidemia, type 2 diabetes mellitus, hypertension and palpitations here for routine follow-up.   She has had a good year.  She has not been bothered by palpitations.  She denies issues with shortness of breath or chest pain either at rest with activity and has not had any lower extremity edema, claudication or focal neurological complaints.  She has not managed to lose any weight this year, but better than most steadily she has not gained any weight during the Covid pandemic.  Blood pressure is marginally high today, but was only 130/82 at her recent office appointment.  She checks it at home it is consistently in the 120s-130s/70s.  Last year she completed radiation therapy for left breast cancer, she continues to take anastrozole.  She received a good report from her oncologist not long ago.  Diabetes control is excellent with a hemoglobin of A1c of only 6.1%.  All her lipid parameters are excellent on pravastatin.  She had a remote cardiac catheterization in 2005 with normal coronary arteries. Her echo shows normal left ventricular ejection fraction, borderline left atrial enlargement and borderline criteria for mitral valve prolapse, mild mitral insufficiency. Past treatment with carvedilol caused problems with wheezing and asthma    Past Medical History:  Diagnosis Date  . Asthma   . Cancer ([place]) 11/2018   left breast cancer  . Diabetes mellitus without complication ([place])   . Dyslipidemia   . History of nuclear stress test 02/2009   exercise; normal pattern of perfusion; no significant ischemia demonstrated; low risk scan   . Hypertension     Past Surgical  History:  Procedure Laterality Date  . BREAST RECONSTRUCTION  04/16/2020    Removal left chest tissue expander and placement silicone implant 2. Latissimus dorsi flap to left chest 3. Right breast reduction  . BREAST RECONSTRUCTION WITH PLACEMENT OF TISSUE EXPANDER AND ALLODERM Left 02/18/2019   Procedure: LEFT BREAST RECONSTRUCTION WITH PLACEMENT OF TISSUE EXPANDER AND ALLODERM;  Surgeon: Irene Limbo, MD;  Location: [facility];  Service: Plastics;  Laterality: Left;  . BREAST REDUCTION SURGERY Right 04/16/2020   Procedure: RIGHT BREAST REDUCTION;  Surgeon: Irene Limbo, MD;  Location: [facility];  Service: Plastics;  Laterality: Right;  . CARDIAC CATHETERIZATION  07/2003   no evidence of CAD  . COLONOSCOPY N/A 02/17/2017   Procedure: COLONOSCOPY;  Surgeon: Daneil Dolin, MD;  [facility];  Service: Endoscopy;  Laterality: N/A;  10:00am  . LATISSIMUS FLAP TO BREAST Left 04/16/2020   Procedure: LEFT LATISSIMUS DORSI FLAP TO LEFT CHEST;  Surgeon: Irene Limbo, MD;  Location: [facility];  Service: Plastics;  Laterality: Left;  Marland Kitchen MASTECTOMY W/ SENTINEL NODE BIOPSY Left 02/18/2019   Procedure: LEFT MASTECTOMY WITH LEFT AXILLARY SENTINEL LYMPH NODE BIOPSY;  Surgeon: Alphonsa Overall, MD;  Location: [facility];  Service: General;  Laterality: Left;  . POLYPECTOMY  02/17/2017   Procedure: POLYPECTOMY;  Surgeon: Daneil Dolin, MD;  [facility];  Service: Endoscopy;;  colon  . removal cyst on chin    . REMOVAL OF TISSUE EXPANDER AND PLACEMENT OF  IMPLANT Left 04/16/2020   Procedure: REMOVAL OF LEFT CHEST TISSUE EXPANDER AND PLACEMENT OF IMPLANT;  Surgeon: Irene Limbo, MD;  Location: [facility];  Service: Plastics;  Laterality: Left;  . TRANSTHORACIC ECHOCARDIOGRAM  03/2007   EF=>55%; borderline LA enlargement; MV mildly thickened/borderline MVP/mild MR    Current Medications: Outpatient Medications Prior to Visit  Medication Sig Dispense Refill   . anastrozole (ARIMIDEX) 1 MG tablet TAKE 1 TABLET(1 MG) BY MOUTH DAILY (Patient taking differently: Take 1 mg by mouth daily.) 90 tablet 3  . aspirin EC 81 MG tablet Take 81 mg by mouth daily. Swallow whole.    Marland Kitchen CALCIUM-VITAMIN D PO Take 1 tablet by mouth daily.    . cetirizine (ZYRTEC) 10 MG tablet Take 10 mg by mouth daily.    . Cyanocobalamin (VITAMIN B-12) 2500 MCG SUBL Place 2,500 mcg under the tongue daily.    Marland Kitchen diltiazem (TIAZAC) 360 MG 24 hr capsule TAKE 1 CAPSULE BY MOUTH DAILY (Patient taking differently: Take 360 mg by mouth daily.) 90 capsule 2  . Ferrous Sulfate Dried 45 MG TBCR Take 45 mg by mouth 2 (two) times a week.     . fish oil-omega-3 fatty acids 1000 MG capsule Take 1,000 mg by mouth daily.     Marland Kitchen ibuprofen (ADVIL) 200 MG tablet Take 400 mg by mouth every 6 (six) hours as needed for moderate pain.    Marland Kitchen linaGLIPtin-metFORMIN HCl (JENTADUETO) 2.5-500 MG TABS Take 1 tablet by mouth in the morning and at bedtime. 60 tablet   . Menthol, Topical Analgesic, (BIOFREEZE EX) Apply 1 application topically 2 (two) times daily as needed (pain).    . montelukast (SINGULAIR) 10 MG tablet Take 10 mg by mouth daily before lunch.     . Multiple Vitamin (MULTIVITAMIN WITH MINERALS) TABS tablet Take 1 tablet by mouth daily.    . pravastatin (PRAVACHOL) 40 MG tablet TAKE 1 TABLET(40 MG) BY MOUTH DAILY 90 tablet 1  . PROAIR HFA 108 (90 Base) MCG/ACT inhaler Inhale 2 puffs into the lungs every 4 (four) hours as needed for wheezing or shortness of breath.   2  . terbinafine (LAMISIL) 250 MG tablet Take 500 mg by mouth daily.    Marland Kitchen triamterene-hydrochlorothiazide (MAXZIDE-25) 37.5-25 MG tablet Take 1 tablet by mouth daily.    . TURMERIC PO Take 1 capsule by mouth daily.    . vitamin E 180 MG (400 UNITS) capsule Take 400 Units by mouth daily.    Marland Kitchen losartan (COZAAR) 100 MG tablet Take 1 tablet (100 mg total) by mouth daily. 90 tablet 3   No facility-administered medications prior to visit.      Allergies:   Carvedilol   Social History   Socioeconomic History  . Marital status: Married    Spouse name: Not on file  . Number of children: Not on file  . Years of education: Not on file  . Highest education level: Not on file  Occupational History  . Not on file  Tobacco Use  . Smoking status: Never Smoker  . Smokeless tobacco: Never Used  . Tobacco comment: never smoke.  Vaping Use  . Vaping Use: Never used  Substance and Sexual Activity  . Alcohol use: No  . Drug use: No  . Sexual activity: Yes    Birth control/protection: Post-menopausal  Other Topics Concern  . Not on file  Social History Narrative  . Not on file   Social Determinants of Health   Financial Resource Strain: Not on  file  Food Insecurity: Not on file  Transportation Needs: Not on file  Physical Activity: Not on file  Stress: Not on file  Social Connections: Not on file     Family History:  The patient's family history includes Diabetes in her daughter; Heart disease in her father and mother; Hypertension in her daughter and sister.   ROS:   Please see the history of present illness.    ROS All other systems are reviewed and are negative.   PHYSICAL EXAM:   VS:  BP (!) 150/90 (BP Location: Right Arm, Patient Position: Sitting)   Pulse 73   Ht 5\' 5"  (1.651 m)   Wt 195 lb (88.5 kg)   SpO2 98%   BMI 32.45 kg/m      General: Alert, oriented x3, no distress, mildly obese Head: no evidence of trauma, PERRL, EOMI, no exophtalmos or lid lag, no myxedema, no xanthelasma; normal ears, nose and oropharynx Neck: normal jugular venous pulsations and no hepatojugular reflux; brisk carotid pulses without delay and no carotid bruits Chest: clear to auscultation, no signs of consolidation by percussion or palpation, normal fremitus, symmetrical and full respiratory excursions Cardiovascular: normal position and quality of the apical impulse, regular rhythm, normal first and second heart sounds, no  murmurs, rubs or gallops Abdomen: no tenderness or distention, no masses by palpation, no abnormal pulsatility or arterial bruits, normal bowel sounds, no hepatosplenomegaly Extremities: no clubbing, cyanosis or edema; 2+ radial, ulnar and brachial pulses bilaterally; 2+ right femoral, posterior tibial and dorsalis pedis pulses; 2+ left femoral, posterior tibial and dorsalis pedis pulses; no subclavian or femoral bruits Neurological: grossly nonfocal Psych: Normal mood and affect    Wt Readings from Last 3 Encounters:  08/01/20 195 lb (88.5 kg)  04/16/20 197 lb 12 oz (89.7 kg)  04/12/20 197 lb 12.8 oz (89.7 kg)      Studies/Labs Reviewed:   EKG:  EKG is ordered today.  It shows normal sinus rhythm and is a completely normal tracing Recent Labs:  07/02/2020 Hemoglobin A1c 6.1% Total cholesterol 170, HDL 74, triglycerides 85  04/02/2020 Hemoglobin 11.9, creatinine 0.86, normal liver function tests  Lipid Panel    Component Value Date/Time   CHOL 167 04/06/2013 1049   TRIG 52 04/06/2013 1049   HDL 72 04/06/2013 1049   CHOLHDL 2.3 04/06/2013 1049   VLDL 10 04/06/2013 1049   LDLCALC 85 04/06/2013 1049     ASSESSMENT:    1. Palpitations   2. Essential hypertension   3. Diabetes mellitus type 2 in obese ([place])   4. Mild obesity   5. Hypercholesterolemia      PLAN:  In order of problems listed above:  1. Palpitations: Asymptomatic on diltiazem.  She had wheezing with carvedilol, but we have not tried more selective beta-blockers. 2. HTN: Slightly high today but typically well within target range of 130/80 or less.  On 4 different agents (diltiazem, losartan maximum dose, triamterene/hydrochlorothiazide). 3. DM: Excellent control. 4. Obesity: Encourage more physical activity and weight loss.. 5. HLP: All lipid parameters in desirable range.  Continue pravastatin.    Medication Adjustments/Labs and Tests Ordered: Current medicines are reviewed at length with the  patient today.  Concerns regarding medicines are outlined above.  Medication changes, Labs and Tests ordered today are listed in the Patient Instructions below. Patient Instructions  Medication Instructions:  No changes *If you need a refill on your cardiac medications before your next appointment, please call your pharmacy*   Lab Work: None  ordered If you have labs (blood work) drawn today and your tests are completely normal, you will receive your results only by: Marland Kitchen MyChart Message (if you have MyChart) OR . A paper copy in the mail If you have any lab test that is abnormal or we need to change your treatment, we will call you to review the results.   Testing/Procedures: None ordered   Follow-Up: At [facility], you and your health needs are our priority.  As part of our continuing mission to provide you with exceptional heart care, we have created designated Provider Care Teams.  These Care Teams include your primary Cardiologist (physician) and Advanced Practice Providers (APPs -  Physician Assistants and Nurse Practitioners) who all work together to provide you with the care you need, when you need it.  We recommend signing up for the patient portal called "MyChart".  Sign up information is provided on this After Visit Summary.  MyChart is used to connect with patients for Virtual Visits (Telemedicine).  Patients are able to view lab/test results, encounter notes, upcoming appointments, etc.  Non-urgent messages can be sent to your provider as well.   To learn more about what you can do with MyChart, go to [email or web address].    Your next appointment:   12 month(s)  The format for your next appointment:   In Person  Provider:   You may see Sanda Klein, MD or one of the following Advanced Practice Providers on your designated Care Team:    Almyra Deforest, PA-C  Fabian Sharp, [place] or   Roby Lofts, PA-C     Signed, Sanda Klein, MD  08/01/2020 10:42 AM    [facility] [address], [place], [place]  [address] Phone: [phone]; Fax: [phone]

## 2020-09-09 IMAGING — MG DIGITAL DIAGNOSTIC UNILATERAL LEFT MAMMOGRAM WITH TOMO AND CAD
8 series · 8 of 24 positions shown · non-contrast
Comparison: Previous exam(s).

CLINICAL DATA: 58-year-old female recalled from screening mammogram
dated 11/26/2018 for possible left breast mass, distortion and
asymmetry.

EXAM:
DIGITAL DIAGNOSTIC LEFT MAMMOGRAM WITH CAD AND TOMO
ULTRASOUND LEFT BREAST

[L ML synth-2D]
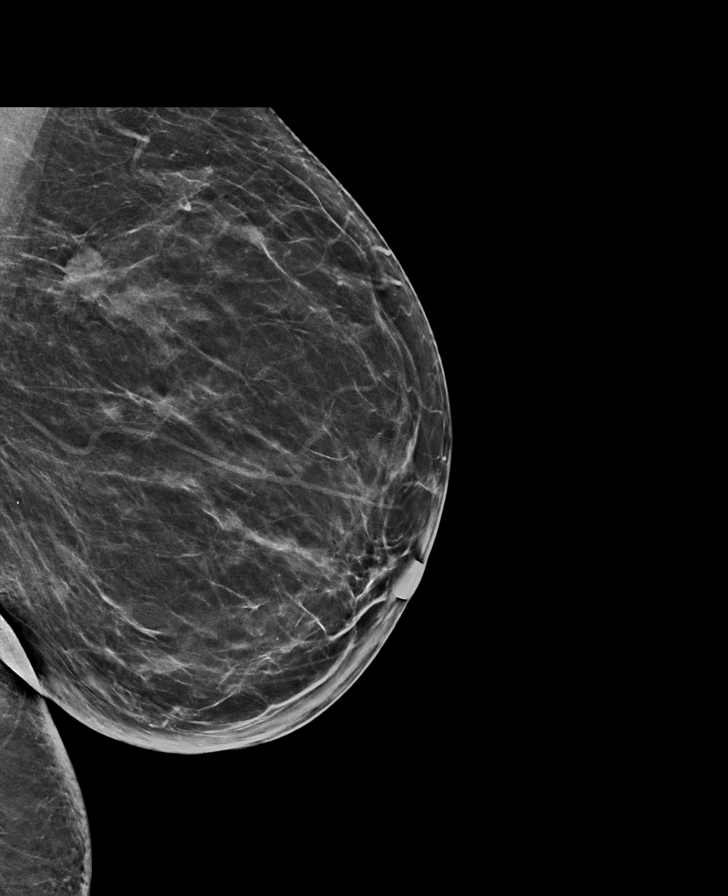

[L CC synth-2D (1 of 2)]
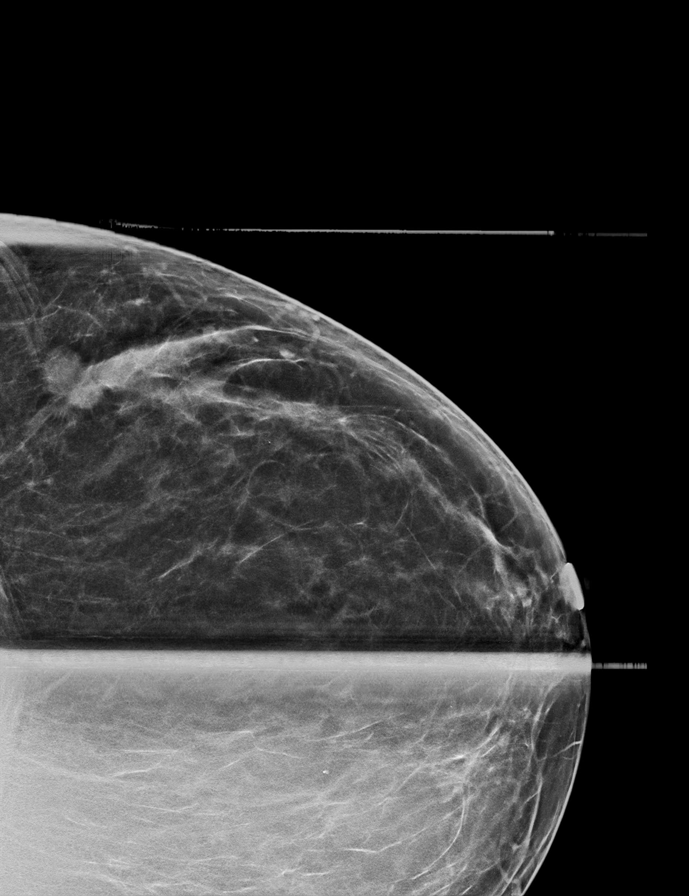

[L CC synth-2D (2 of 2)]
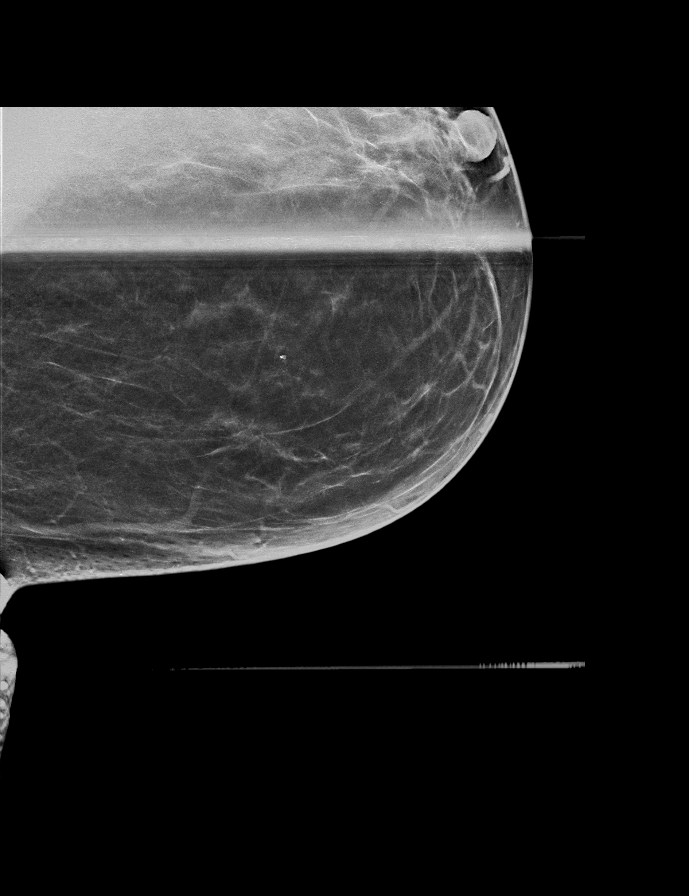

[L MLO synth-2D]
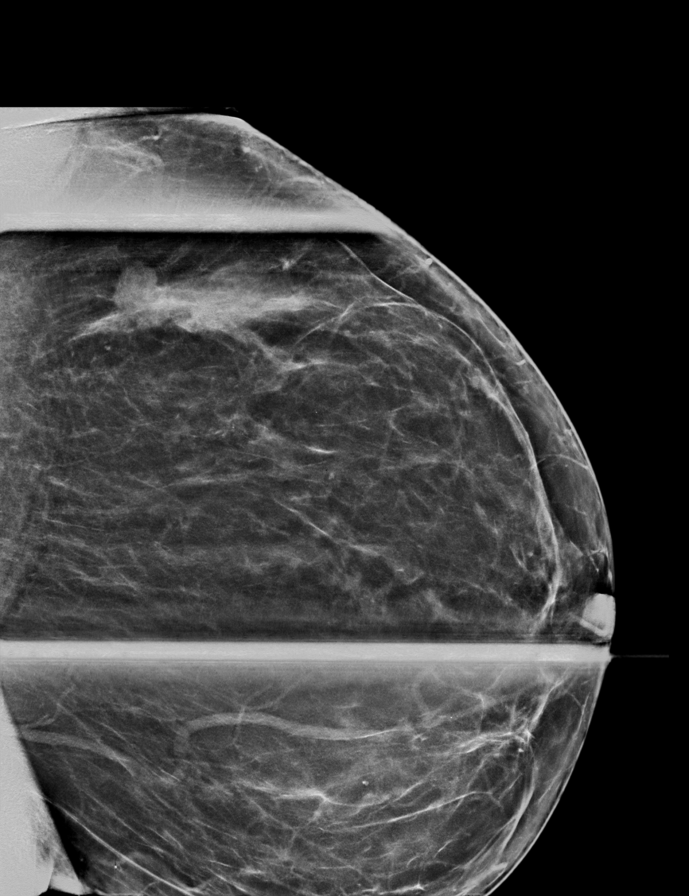

[L CC tomo (1 of 2) · tomo slice 29/56.0]
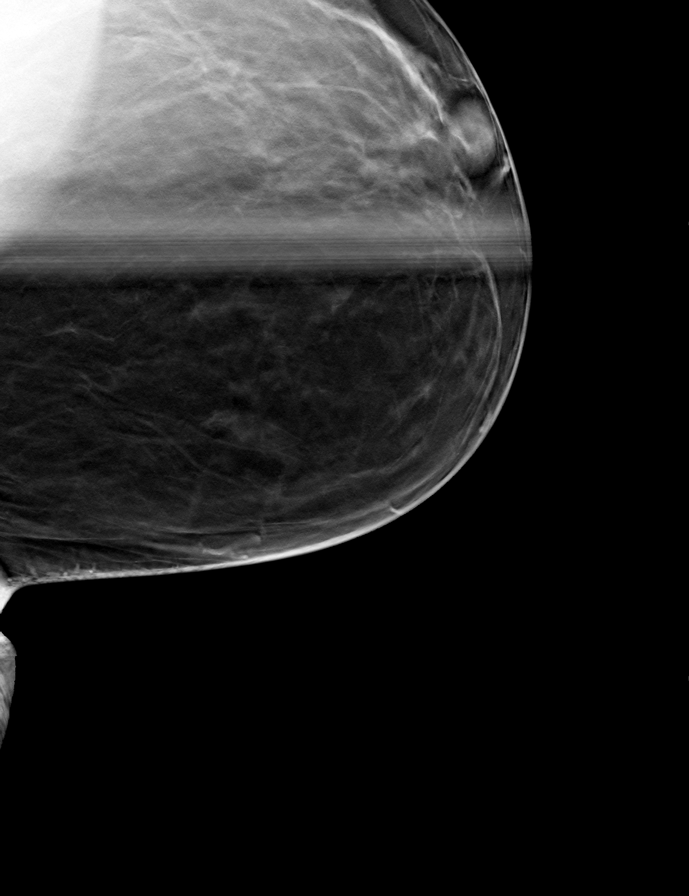

[L ML tomo · tomo slice 35/69.0]
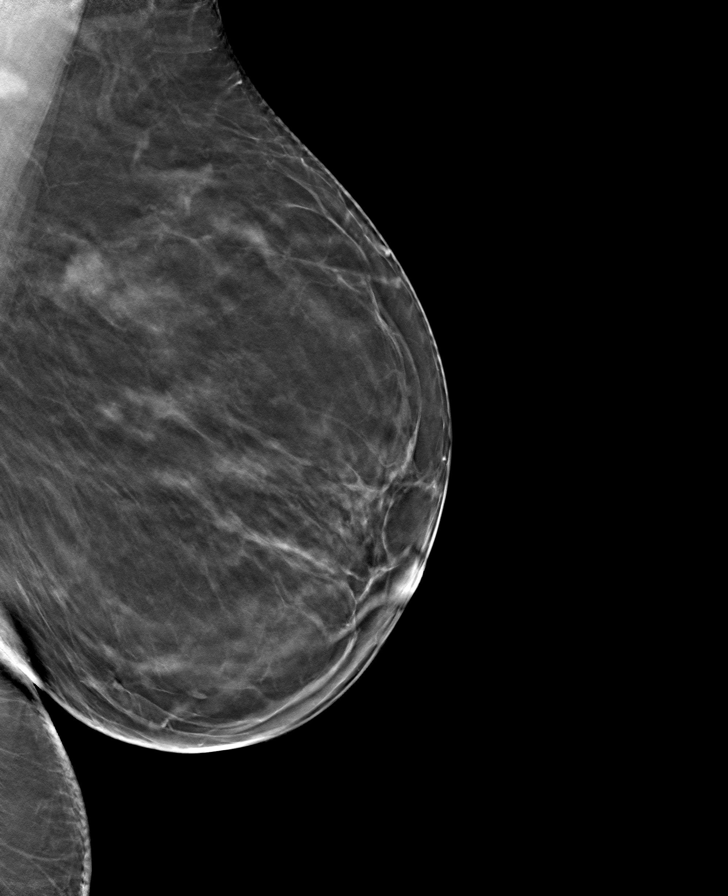

[L CC tomo (2 of 2) · tomo slice 32/63.0]
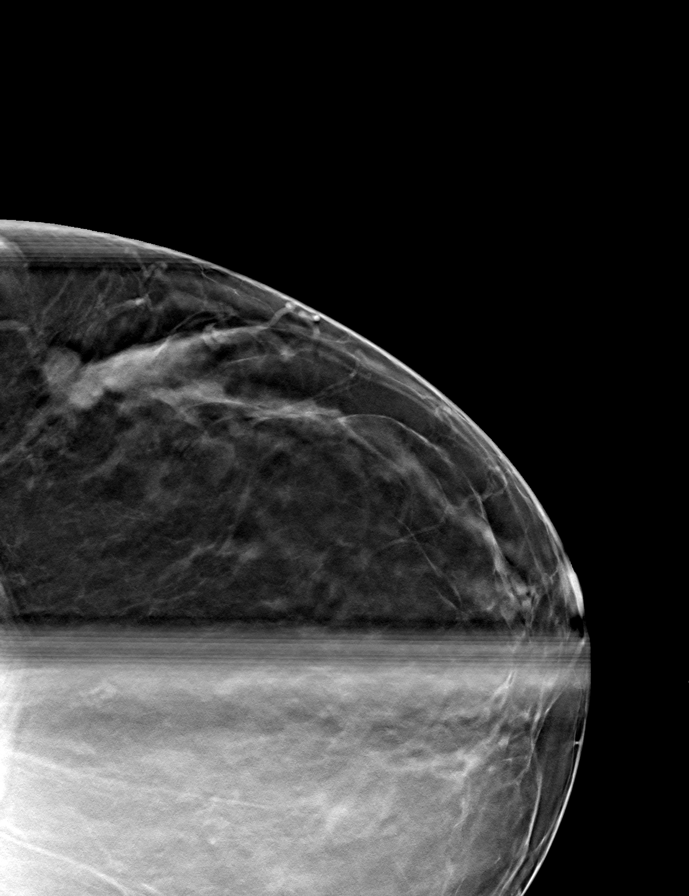

[L MLO tomo · tomo slice 35/68.0]
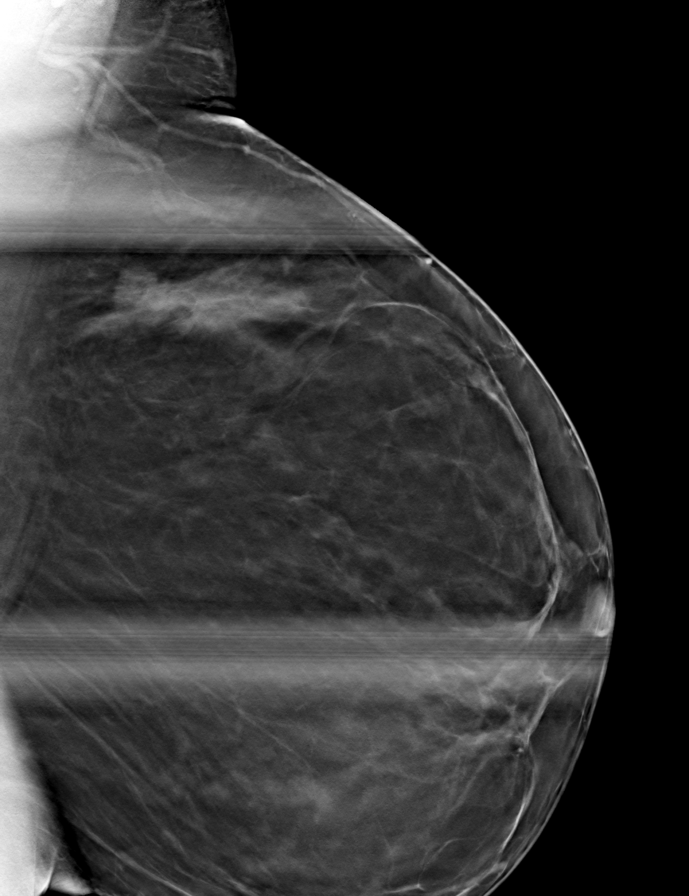

[8 of 24 positions shown; findings below may reference images not displayed]

ACR Breast Density Category b: There are scattered areas of
fibroglandular density.
FINDINGS: A hyperdense spiculated mass persists in the far posterior upper
outer left breast. An adjacent asymmetry does not persist on today's
additional views. There is questionable focal distortion in the
upper inner aspect at middle depth. Further evaluation with
ultrasound was performed.

Mammographic images were processed with CAD.

On physical exam, I palpate a fixed mass in the upper outer left
breast.

Targeted ultrasound is performed, showing a spiculated hypoechoic
mass at the 2 o'clock position 7 cm from the nipple. It measures
x 1.5 x 1.3 cm. There is associated vascularity. This correlates
well with the mammographic finding.

Evaluation of the upper inner left breast demonstrates no focal
sonographic abnormalities.

Evaluation of the left axilla demonstrates 2 morphologically
abnormal lymph nodes with complete effacement of the fatty hila.
They measure up to 5 mm in cortical thickening.
IMPRESSION: 1. Highly suspicious left breast mass at the 2 o'clock position 7 cm
from the nipple. Recommendation is for ultrasound-guided biopsy.
2. Indeterminate left axillary lymphadenopathy with cortical
thickening up to 5 mm. Recommendation is for ultrasound-guided
biopsy.
3. Indeterminate, possible distortion vulva in the upper inner left
breast without ultrasound correlate. Recommendation is for 3D
stereotactic biopsy.

RECOMMENDATION:
Single area 3D stereotactic biopsy and 2 area ultrasound-guided
biopsy of the left breast.

I have discussed the findings and recommendations with the patient.
Results were also provided in writing at the conclusion of the
visit. If applicable, a reminder letter will be sent to the patient
regarding the next appointment.

BI-RADS CATEGORY  5: Highly suggestive of malignancy.

## 2020-09-27 ENCOUNTER — Encounter: Payer: Self-pay | Admitting: Obstetrics & Gynecology

## 2020-09-27 ENCOUNTER — Other Ambulatory Visit: Payer: Self-pay

## 2020-09-27 ENCOUNTER — Other Ambulatory Visit (HOSPITAL_COMMUNITY)
Admission: RE | Admit: 2020-09-27 | Discharge: 2020-09-27 | Disposition: A | Discharge status: Home / Self Care | Payer: BC Managed Care – PPO | Source: Ambulatory Visit | Attending: Obstetrics & Gynecology | Admitting: Obstetrics & Gynecology

## 2020-09-27 ENCOUNTER — Ambulatory Visit (INDEPENDENT_AMBULATORY_CARE_PROVIDER_SITE_OTHER): Payer: BC Managed Care – PPO | Admitting: Obstetrics & Gynecology

## 2020-09-27 VITALS — BP 139/75 | HR 72 | Ht 65.0 in | Wt 194.0 lb

## 2020-09-27 DIAGNOSIS — Z01419 Encounter for gynecological examination (general) (routine) without abnormal findings: Secondary | ICD-10-CM | POA: Diagnosis not present

## 2020-09-27 DIAGNOSIS — Z1211 Encounter for screening for malignant neoplasm of colon: Secondary | ICD-10-CM | POA: Diagnosis not present

## 2020-09-27 DIAGNOSIS — Z1212 Encounter for screening for malignant neoplasm of rectum: Secondary | ICD-10-CM

## 2020-09-27 NOTE — Progress Notes (Signed)
Subjective:     Tracy Knight is a 60 y.o. female here for a routine exam.  No LMP recorded. Patient is postmenopausal. G3P3 Birth Control Method:  BTL/menopause Menstrual Calendar(currently): amenorrheic  Current complaints: none.   Current acute medical issues:  none   Recent Gynecologic History No LMP recorded. Patient is postmenopausal. Last Pap: 08/11/2017,  normal Last mammogram: 7/21,  normal  Past Medical History:  Diagnosis Date  . Asthma   . Cancer ([place]) 11/2018   left breast cancer  . Diabetes mellitus without complication ([place])   . Dyslipidemia   . History of nuclear stress test 02/2009   exercise; normal pattern of perfusion; no significant ischemia demonstrated; low risk scan   . Hypertension     Past Surgical History:  Procedure Laterality Date  . BREAST RECONSTRUCTION  04/16/2020    Removal left chest tissue expander and placement silicone implant 2. Latissimus dorsi flap to left chest 3. Right breast reduction  . BREAST RECONSTRUCTION WITH PLACEMENT OF TISSUE EXPANDER AND ALLODERM Left 02/18/2019   Procedure: LEFT BREAST RECONSTRUCTION WITH PLACEMENT OF TISSUE EXPANDER AND ALLODERM;  Surgeon: Irene Limbo, MD;  Location: [facility];  Service: Plastics;  Laterality: Left;  . BREAST REDUCTION SURGERY Right 04/16/2020   Procedure: RIGHT BREAST REDUCTION;  Surgeon: Irene Limbo, MD;  Location: [facility];  Service: Plastics;  Laterality: Right;  . CARDIAC CATHETERIZATION  07/2003   no evidence of CAD  . COLONOSCOPY N/A 02/17/2017   Procedure: COLONOSCOPY;  Surgeon: Daneil Dolin, MD;  [facility];  Service: Endoscopy;  Laterality: N/A;  10:00am  . LATISSIMUS FLAP TO BREAST Left 04/16/2020   Procedure: LEFT LATISSIMUS DORSI FLAP TO LEFT CHEST;  Surgeon: Irene Limbo, MD;  Location: [facility];  Service: Plastics;  Laterality: Left;  Marland Kitchen MASTECTOMY W/ SENTINEL NODE BIOPSY Left 02/18/2019   Procedure: LEFT MASTECTOMY WITH LEFT AXILLARY  SENTINEL LYMPH NODE BIOPSY;  Surgeon: Alphonsa Overall, MD;  Location: [facility];  Service: General;  Laterality: Left;  . POLYPECTOMY  02/17/2017   Procedure: POLYPECTOMY;  Surgeon: Daneil Dolin, MD;  [facility];  Service: Endoscopy;;  colon  . removal cyst on chin    . REMOVAL OF TISSUE EXPANDER AND PLACEMENT OF IMPLANT Left 04/16/2020   Procedure: REMOVAL OF LEFT CHEST TISSUE EXPANDER AND PLACEMENT OF IMPLANT;  Surgeon: Irene Limbo, MD;  Location: [facility];  Service: Plastics;  Laterality: Left;  . TRANSTHORACIC ECHOCARDIOGRAM  03/2007   EF=>55%; borderline LA enlargement; MV mildly thickened/borderline MVP/mild MR    OB History    Gravida  3   Para  3   Term      Preterm      AB      Living  3     SAB      IAB      Ectopic      Multiple      Live Births              Social History   Socioeconomic History  . Marital status: Married    Spouse name: Not on file  . Number of children: Not on file  . Years of education: Not on file  . Highest education level: Not on file  Occupational History  . Not on file  Tobacco Use  . Smoking status: Never Smoker  . Smokeless tobacco: Never Used  . Tobacco comment: never smoke.  Vaping Use  . Vaping Use:  Never used  Substance and Sexual Activity  . Alcohol use: No  . Drug use: No  . Sexual activity: Yes    Birth control/protection: Post-menopausal  Other Topics Concern  . Not on file  Social History Narrative  . Not on file   Social Determinants of Health   Financial Resource Strain: Low Risk   . Difficulty of Paying Living Expenses: Not hard at all  Food Insecurity: No Food Insecurity  . Worried About Charity fundraiser in the Last Year: Never true  . Ran Out of Food in the Last Year: Never true  Transportation Needs: No Transportation Needs  . Lack of Transportation (Medical): No  . Lack of Transportation (Non-Medical): No  Physical Activity: Insufficiently Active  .  Days of Exercise per Week: 3 days  . Minutes of Exercise per Session: 20 min  Stress: No Stress Concern Present  . Feeling of Stress : Not at all  Social Connections: Moderately Integrated  . Frequency of Communication with Friends and Family: More than three times a week  . Frequency of Social Gatherings with Friends and Family: Once a week  . Attends Religious Services: More than 4 times per year  . Active Member of Clubs or Organizations: No  . Attends Archivist Meetings: Never  . Marital Status: Married    Family History  Problem Relation Age of Onset  . Heart disease Mother   . Heart disease Father   . Hypertension Sister   . Hypertension Daughter   . Diabetes Daughter      Current Outpatient Medications:  .  anastrozole (ARIMIDEX) 1 MG tablet, TAKE 1 TABLET(1 MG) BY MOUTH DAILY (Patient taking differently: Take 1 mg by mouth daily.), Disp: 90 tablet, Rfl: 3 .  aspirin EC 81 MG tablet, Take 81 mg by mouth daily. Swallow whole., Disp: , Rfl:  .  CALCIUM-VITAMIN D PO, Take 1 tablet by mouth daily., Disp: , Rfl:  .  cetirizine (ZYRTEC) 10 MG tablet, Take 10 mg by mouth daily., Disp: , Rfl:  .  Cyanocobalamin (VITAMIN B-12) 2500 MCG SUBL, Place 2,500 mcg under the tongue daily., Disp: , Rfl:  .  diltiazem (TIAZAC) 360 MG 24 hr capsule, TAKE 1 CAPSULE BY MOUTH DAILY (Patient taking differently: Take 360 mg by mouth daily.), Disp: 90 capsule, Rfl: 2 .  Ferrous Sulfate Dried 45 MG TBCR, Take 45 mg by mouth 2 (two) times a week. , Disp: , Rfl:  .  fish oil-omega-3 fatty acids 1000 MG capsule, Take 1,000 mg by mouth daily. , Disp: , Rfl:  .  ibuprofen (ADVIL) 200 MG tablet, Take 400 mg by mouth every 6 (six) hours as needed for moderate pain., Disp: , Rfl:  .  linaGLIPtin-metFORMIN HCl (JENTADUETO) 2.5-500 MG TABS, Take 1 tablet by mouth in the morning and at bedtime., Disp: 60 tablet, Rfl:  .  losartan (COZAAR) 100 MG tablet, Take 1 tablet (100 mg total) by mouth daily.,  Disp: 90 tablet, Rfl: 3 .  Menthol, Topical Analgesic, (BIOFREEZE EX), Apply 1 application topically 2 (two) times daily as needed (pain)., Disp: , Rfl:  .  montelukast (SINGULAIR) 10 MG tablet, Take 10 mg by mouth daily before lunch. , Disp: , Rfl:  .  Multiple Vitamin (MULTIVITAMIN WITH MINERALS) TABS tablet, Take 1 tablet by mouth daily., Disp: , Rfl:  .  pravastatin (PRAVACHOL) 40 MG tablet, TAKE 1 TABLET(40 MG) BY MOUTH DAILY, Disp: 90 tablet, Rfl: 1 .  PROAIR HFA 108 (  90 Base) MCG/ACT inhaler, Inhale 2 puffs into the lungs every 4 (four) hours as needed for wheezing or shortness of breath. , Disp: , Rfl: 2 .  terbinafine (LAMISIL) 250 MG tablet, Take 500 mg by mouth daily., Disp: , Rfl:  .  triamterene-hydrochlorothiazide (MAXZIDE-25) 37.5-25 MG tablet, Take 1 tablet by mouth daily., Disp: , Rfl:  .  TURMERIC PO, Take 1 capsule by mouth daily., Disp: , Rfl:  .  vitamin E 180 MG (400 UNITS) capsule, Take 400 Units by mouth daily., Disp: , Rfl:   Review of Systems  Review of Systems  Constitutional: Negative for fever, chills, weight loss, malaise/fatigue and diaphoresis.  HENT: Negative for hearing loss, ear pain, nosebleeds, congestion, sore throat, neck pain, tinnitus and ear discharge.   Eyes: Negative for blurred vision, double vision, photophobia, pain, discharge and redness.  Respiratory: Negative for cough, hemoptysis, sputum production, shortness of breath, wheezing and stridor.   Cardiovascular: Negative for chest pain, palpitations, orthopnea, claudication, leg swelling and PND.  Gastrointestinal: negative for abdominal pain. Negative for heartburn, nausea, vomiting, diarrhea, constipation, blood in stool and melena.  Genitourinary: Negative for dysuria, urgency, frequency, hematuria and flank pain.  Musculoskeletal: Negative for myalgias, back pain, joint pain and falls.  Skin: Negative for itching and rash.  Neurological: Negative for dizziness, tingling, tremors, sensory  change, speech change, focal weakness, seizures, loss of consciousness, weakness and headaches.  Endo/Heme/Allergies: Negative for environmental allergies and polydipsia. Does not bruise/bleed easily.  Psychiatric/Behavioral: Negative for depression, suicidal ideas, hallucinations, memory loss and substance abuse. The patient is not nervous/anxious and does not have insomnia.        Objective:  Blood pressure 139/75, pulse 72, height 5\' 5"  (1.651 m), weight 194 lb (88 kg).   Physical Exam  Vitals reviewed. Constitutional: She is oriented to person, place, and time. She appears well-developed and well-nourished.  HENT:  Head: Normocephalic and atraumatic.        Right Ear: External ear normal.  Left Ear: External ear normal.  Nose: Nose normal.  Mouth/Throat: Oropharynx is clear and moist.  Eyes: Conjunctivae and EOM are normal. Pupils are equal, round, and reactive to light. Right eye exhibits no discharge. Left eye exhibits no discharge. No scleral icterus.  Neck: Normal range of motion. Neck supple. No tracheal deviation present. No thyromegaly present.  Cardiovascular: Normal rate, regular rhythm, normal heart sounds and intact distal pulses.  Exam reveals no gallop and no friction rub.   No murmur heard. Respiratory: Effort normal and breath sounds normal. No respiratory distress. She has no wheezes. She has no rales. She exhibits no tenderness.  GI: Soft. Bowel sounds are normal. She exhibits no distension and no mass. There is no tenderness. There is no rebound and no guarding.  Genitourinary:  Breasts no masses skin changes or nipple changes bilaterally      Vulva is normal without lesions Vagina is pink moist without discharge Cervix normal in appearance and pap is done Uterus is normal size shape and contour Adnexa is negative with normal sized ovaries  {Rectal    hemoccult negative, normal tone, no masses  Musculoskeletal: Normal range of motion. She exhibits no edema and no  tenderness.  Neurological: She is alert and oriented to person, place, and time. She has normal reflexes. She displays normal reflexes. No cranial nerve deficit. She exhibits normal muscle tone. Coordination normal.  Skin: Skin is warm and dry. No rash noted. No erythema. No pallor.  Psychiatric: She has a normal  mood and affect. Her behavior is normal. Judgment and thought content normal.       Medications Ordered at today's visit: No orders of the defined types were placed in this encounter.   Other orders placed at today's visit: No orders of the defined types were placed in this encounter.     Assessment:    Normal Gyn exam.   Breast Cancer Plan:    Mammogram ordered. Follow up in: 3 years.     No follow-ups on file.

## 2020-09-28 LAB — CYTOLOGY - PAP
Comment: NEGATIVE
Diagnosis: NEGATIVE
High risk HPV: NEGATIVE

## 2020-11-05 DIAGNOSIS — Z6832 Body mass index (BMI) 32.0-32.9, adult: Secondary | ICD-10-CM | POA: Diagnosis not present

## 2020-11-05 DIAGNOSIS — E119 Type 2 diabetes mellitus without complications: Secondary | ICD-10-CM | POA: Diagnosis not present

## 2020-11-05 DIAGNOSIS — I10 Essential (primary) hypertension: Secondary | ICD-10-CM | POA: Diagnosis not present

## 2020-11-05 DIAGNOSIS — E7849 Other hyperlipidemia: Secondary | ICD-10-CM | POA: Diagnosis not present

## 2020-11-09 DIAGNOSIS — Z170 Estrogen receptor positive status [ER+]: Secondary | ICD-10-CM | POA: Diagnosis not present

## 2020-11-09 DIAGNOSIS — C50912 Malignant neoplasm of unspecified site of left female breast: Secondary | ICD-10-CM | POA: Diagnosis not present

## 2020-11-30 ENCOUNTER — Other Ambulatory Visit: Payer: Self-pay | Admitting: Cardiovascular Disease

## 2020-12-15 ENCOUNTER — Other Ambulatory Visit: Payer: Self-pay | Admitting: Oncology

## 2020-12-18 ENCOUNTER — Other Ambulatory Visit (HOSPITAL_COMMUNITY): Payer: Self-pay | Admitting: Family Medicine

## 2020-12-18 DIAGNOSIS — Z1231 Encounter for screening mammogram for malignant neoplasm of breast: Secondary | ICD-10-CM

## 2021-01-04 ENCOUNTER — Other Ambulatory Visit: Payer: Self-pay

## 2021-01-04 ENCOUNTER — Other Ambulatory Visit (HOSPITAL_COMMUNITY): Payer: Self-pay | Admitting: Family Medicine

## 2021-01-04 ENCOUNTER — Ambulatory Visit (HOSPITAL_COMMUNITY)
Admission: RE | Admit: 2021-01-04 | Discharge: 2021-01-04 | Disposition: A | Discharge status: Home / Self Care | Payer: BC Managed Care – PPO | Source: Ambulatory Visit | Attending: Family Medicine | Admitting: Family Medicine

## 2021-01-04 DIAGNOSIS — Z1231 Encounter for screening mammogram for malignant neoplasm of breast: Secondary | ICD-10-CM

## 2021-01-07 NOTE — Progress Notes (Signed)
Patient Care Team: Sharilyn Sites, MD as PCP - General (Family Medicine) Croitoru, Dani Gobble, MD as PCP - Cardiology (Cardiology) Alphonsa Overall, MD as Consulting Physician (General Surgery) Nicholas Lose, MD as Consulting Physician (Hematology and Oncology) Kyung Rudd, MD as Consulting Physician (Radiation Oncology) Irene Limbo, MD as Consulting Physician (Plastic Surgery)  DIAGNOSIS:    ICD-10-CM   1. Malignant neoplasm of overlapping sites of left breast in female, estrogen receptor positive ([place])  C50.812    Z17.0       SUMMARY OF ONCOLOGIC HISTORY: Oncology History  Malignant neoplasm of overlapping sites of left breast in female, estrogen receptor positive ([place])  12/17/2018 Initial Diagnosis   Screening mammogram detected 1.5cm left breast mass, left breast distortion, and cortical thickening in left axillary lymph node. Biopsy confirmed invasive mammary carcinoma with mammary carcinoma in situ, grade 2-3, HER2- (1+), ER+ (100%), PR+ (100%), Ki67 10%. No evidence of carcinoma in the left axillary lymph node.    12/22/2018 Cancer Staging   Staging form: Breast, AJCC 8th Edition - Clinical stage from 12/22/2018: Stage IA (cT1c, cN0, cM0, G3, ER+, PR+, HER2-)    11/2018 - 11/2023 Anti-estrogen oral therapy   Anastrozole   02/18/2019 Surgery   Left mastectomy and sentinel lymph node biopsy [name], [place]) [phone]): invasive lobular carcinoma, grade 2, 1.6cm, clear margins. One out of five lymph nodes positive for metastatic carcinoma, 0.5cm, with extranodal extension.    02/25/2019 Cancer Staging   Staging form: Breast, AJCC 8th Edition - Pathologic: Stage IA (pT1c, pN1a, cM0, G2, ER+, PR+, HER2-)     03/11/2019 Oncotype testing   Mammaprint: low risk, 10% of recurrence in 10 years without systemic therapy   04/20/2019 - 06/06/2019 Radiation Therapy   The patient initially received a dose of 50.4 Gy in 28 fractions to the chest wall and supraclavicular region. This was  delivered using a 3-D conformal, 4 field technique. The patient then received a boost to the mastectomy scar. This delivered an additional 10 Gy in 5 fractions using an en face electron field. The total dose was 60.4 Gy.     CHIEF COMPLIANT: Follow-up of left breast cancer on anastrozole  INTERVAL HISTORY: Tracy Knight is a 60 y.o. with above-mentioned history of left breast cancer treated with neoadjuvant anastrozole therapy, left mastectomy, radiation, and is currently on anti-estrogen therapy with anastrozole. She presents to the clinic today for follow-up.  She is tolerating anastrozole extremely well without any problems or concerns.  Denies any hot flashes or arthralgias or myalgias.  Denies any lumps or nodules in the breast of the chest wall.  ALLERGIES:  is allergic to carvedilol.  MEDICATIONS:  Current Outpatient Medications  Medication Sig Dispense Refill   anastrozole (ARIMIDEX) 1 MG tablet TAKE 1 TABLET(1 MG) BY MOUTH DAILY 90 tablet 3   aspirin EC 81 MG tablet Take 81 mg by mouth daily. Swallow whole.     CALCIUM-VITAMIN D PO Take 1 tablet by mouth daily.     cetirizine (ZYRTEC) 10 MG tablet Take 10 mg by mouth daily.     Cyanocobalamin (VITAMIN B-12) 2500 MCG SUBL Place 2,500 mcg under the tongue daily.     diltiazem (TIAZAC) 360 MG 24 hr capsule TAKE 1 CAPSULE BY MOUTH DAILY (Patient taking differently: Take 360 mg by mouth daily.) 90 capsule 2   Ferrous Sulfate Dried 45 MG TBCR Take 45 mg by mouth 2 (two) times a week.      fish oil-omega-3 fatty acids 1000 MG capsule  Take 1,000 mg by mouth daily.      ibuprofen (ADVIL) 200 MG tablet Take 400 mg by mouth every 6 (six) hours as needed for moderate pain.     linaGLIPtin-metFORMIN HCl (JENTADUETO) 2.5-500 MG TABS Take 1 tablet by mouth in the morning and at bedtime. 60 tablet    losartan (COZAAR) 100 MG tablet Take 1 tablet (100 mg total) by mouth daily. 90 tablet 3   Menthol, Topical Analgesic, (BIOFREEZE EX) Apply 1  application topically 2 (two) times daily as needed (pain).     montelukast (SINGULAIR) 10 MG tablet Take 10 mg by mouth daily before lunch.      Multiple Vitamin (MULTIVITAMIN WITH MINERALS) TABS tablet Take 1 tablet by mouth daily.     pravastatin (PRAVACHOL) 40 MG tablet TAKE 1 TABLET(40 MG) BY MOUTH DAILY 90 tablet 3   PROAIR HFA 108 (90 Base) MCG/ACT inhaler Inhale 2 puffs into the lungs every 4 (four) hours as needed for wheezing or shortness of breath.   2   terbinafine (LAMISIL) 250 MG tablet Take 500 mg by mouth daily.     triamterene-hydrochlorothiazide (MAXZIDE-25) 37.5-25 MG tablet Take 1 tablet by mouth daily.     TURMERIC PO Take 1 capsule by mouth daily.     vitamin E 180 MG (400 UNITS) capsule Take 400 Units by mouth daily.     No current facility-administered medications for this visit.    PHYSICAL EXAMINATION: ECOG PERFORMANCE STATUS: 1 - Symptomatic but completely ambulatory  Vitals:   01/08/21 0955  BP: (!) 156/76  Pulse: 73  Resp: 19  Temp: 97.6 F (36.4 C)  SpO2: 99%   Filed Weights   01/08/21 0955  Weight: 195 lb 6.4 oz (88.6 kg)    BREAST: No palpable masses or nodules in either right or left breasts. No palpable axillary supraclavicular or infraclavicular adenopathy no breast tenderness or nipple discharge. (exam performed in the presence of a chaperone)  LABORATORY DATA:  I have reviewed the data as listed CMP Latest Ref Rng & Units 04/12/2020 02/15/2019 12/22/2018  Glucose 70 - 99 mg/dL 148(H) 104(H) 119(H)  BUN 8 - 23 mg/dL 18 14 24(H)  Creatinine 0.44 - 1.00 mg/dL 0.86 0.84 1.01(H)  Sodium 135 - 145 mmol/L 140 140 141  Potassium 3.5 - 5.1 mmol/L 3.4(L) 3.9 3.2(L)  Chloride 98 - 111 mmol/L 100 101 101  CO2 22 - 32 mmol/L _0 Calcium 8.9 - 10.3 mg/dL 10.6(H) 10.0 10.2  Total Protein 6.5 - 8.1 g/dL - - 8.3(H)  Total Bilirubin 0.3 - 1.2 mg/dL - - 0.8  Alkaline Phos 38 - 126 U/L - - 69  AST 15 - 41 U/L - - 19  ALT 0 - 44 U/L - - 18    Lab  Results  Component Value Date   WBC 5.7 04/12/2020   HGB 11.9 (L) 04/12/2020   HCT 39.4 04/12/2020   MCV 83.8 04/12/2020   PLT 254 04/12/2020   NEUTROABS 4.0 12/22/2018    ASSESSMENT & PLAN:  Malignant neoplasm of overlapping sites of left breast in female, estrogen receptor positive ([place]) 12/17/2018:Screening mammogram detected 1.5cm left breast mass, left breast distortion, and cortical thickening in left axillary lymph node. Biopsy confirmed invasive mammary carcinoma with mammary carcinoma in situ, grade 2-3, HER2- (1+), ER+ (100%), PR+ (100%), Ki67 10%. No evidence of carcinoma in the left axillary lymph node.  T1CN0 stage Ia   Recommendations: 1.  Mastectomy with sentinel lymph node biopsy  followed by 2.  MammaPrint testing to determine if chemotherapy would be of any benefit followed by 3. Adjuvant antiestrogen therapy ---------------------------------------------------------------------------------------------------------------------------------- 02/17/2019: Left mastectomy and sentinel lymph node biopsy [name], [place]): invasive lobular carcinoma, grade 2, 1.6cm, clear margins. One lymph node positive for metastatic carcinoma, 0.5cm, with extranodal extension. 1/5 LN   Recommendation: 1.  MammaPrint: Luminal type A, low risk, did not need chemotherapy 2.  Adjuvant radiation therapy because of lymph node positivity 04/20/2019-06/06/2019 3.  Followed by adjuvant antiestrogen therapy.(Anastrozole was started neoadjuvantly until the time of surgery.) ------------------------------------------------------------------------------------------------------------------------------------------------------------- Treatment plan: Adjuvant antiestrogen therapy with anastrozole. Patient tolerated anastrozole that was given preoperatively. Anastrozole toxicities: None   Breast cancer surveillance: 1.  Mammogram 01/04/2021: Results are unavailable 2. breast exam 01/08/2021: Benign   Bone  density 12/29/2019: T score -1.2: Mild osteopenia I encouraged her to exercise 30 minutes every day.   Return to clinic in 1 year for follow-up    No orders of the defined types were placed in this encounter.  The patient has a good understanding of the overall plan. she agrees with it. she will call with any problems that may develop before the next visit here.  Total time spent: 20 mins including face to face time and time spent for planning, charting and coordination of care  Rulon Eisenmenger, MD, MPH 01/08/2021  I, Thana Ates, am acting as scribe for Dr. Nicholas Lose.  I have reviewed the above documentation for accuracy and completeness, and I agree with the above.

## 2021-01-08 ENCOUNTER — Inpatient Hospital Stay: Payer: BC Managed Care – PPO | Attending: Hematology and Oncology | Admitting: Hematology and Oncology

## 2021-01-08 ENCOUNTER — Other Ambulatory Visit: Payer: Self-pay

## 2021-01-08 DIAGNOSIS — C50812 Malignant neoplasm of overlapping sites of left female breast: Secondary | ICD-10-CM | POA: Insufficient documentation

## 2021-01-08 DIAGNOSIS — M8580 Other specified disorders of bone density and structure, unspecified site: Secondary | ICD-10-CM | POA: Insufficient documentation

## 2021-01-08 DIAGNOSIS — Z923 Personal history of irradiation: Secondary | ICD-10-CM | POA: Diagnosis not present

## 2021-01-08 DIAGNOSIS — Z170 Estrogen receptor positive status [ER+]: Secondary | ICD-10-CM | POA: Diagnosis not present

## 2021-01-08 DIAGNOSIS — Z9012 Acquired absence of left breast and nipple: Secondary | ICD-10-CM | POA: Diagnosis not present

## 2021-01-08 NOTE — Assessment & Plan Note (Signed)
12/17/2018:Screening mammogram detected 1.5cm left breast mass, left breast distortion, and cortical thickening in left axillary lymph node. Biopsy confirmed invasive mammary carcinoma with mammary carcinoma in situ, grade 2-3, HER2- (1+), ER+ (100%), PR+ (100%), Ki67 10%. No evidence of carcinoma in the left axillary lymph node.T1CN0 stage Ia  Recommendations: 1.Mastectomy with sentinel lymph node biopsyfollowed by 2.MammaPrinttesting to determine if chemotherapy would be of any benefit followed by 3. Adjuvant antiestrogen therapy ---------------------------------------------------------------------------------------------------------------------------------- 02/17/2019:Left mastectomy and sentinel lymph node biopsy Tracy Knight, Tracy Knight): invasive lobular carcinoma, grade 2, 1.6cm, clear margins. One lymph node positive for metastatic carcinoma, 0.5cm, with extranodal extension.1/5 LN  Recommendation: 1.MammaPrint: Luminal type A, low risk, did not need chemotherapy 2.Adjuvant radiation therapy because of lymph node positivity10/21/2020- 3.Followed by adjuvant antiestrogen therapy.(Anastrozole was started neoadjuvantlyuntil the time of surgery.) ------------------------------------------------------------------------------------------------------------------------------------------------------------- Treatment plan:Adjuvant antiestrogen therapy with anastrozole. Patient tolerated anastrozole that was given preoperatively. Anastrozole toxicities: None  Breast cancer surveillance: 1.  Mammogram 01/04/2021: Results are unavailable 2. breast exam 01/08/2021: Benign  Bone density 12/29/2019: T score -1.2: Mild osteopenia I encouraged her to exercise 30 minutes every day.  Return to clinic in 1 year for follow-up

## 2021-01-09 ENCOUNTER — Other Ambulatory Visit (HOSPITAL_COMMUNITY): Payer: Self-pay | Admitting: Family Medicine

## 2021-01-09 DIAGNOSIS — R928 Other abnormal and inconclusive findings on diagnostic imaging of breast: Secondary | ICD-10-CM

## 2021-01-23 DIAGNOSIS — Z853 Personal history of malignant neoplasm of breast: Secondary | ICD-10-CM | POA: Diagnosis not present

## 2021-01-23 DIAGNOSIS — Z9012 Acquired absence of left breast and nipple: Secondary | ICD-10-CM | POA: Diagnosis not present

## 2021-01-23 DIAGNOSIS — Z923 Personal history of irradiation: Secondary | ICD-10-CM | POA: Diagnosis not present

## 2021-01-29 ENCOUNTER — Ambulatory Visit (HOSPITAL_COMMUNITY)
Admission: RE | Admit: 2021-01-29 | Discharge: 2021-01-29 | Disposition: A | Discharge status: Home / Self Care | Payer: BC Managed Care – PPO | Source: Ambulatory Visit | Attending: Family Medicine | Admitting: Family Medicine

## 2021-01-29 ENCOUNTER — Other Ambulatory Visit: Payer: Self-pay

## 2021-01-29 DIAGNOSIS — R928 Other abnormal and inconclusive findings on diagnostic imaging of breast: Secondary | ICD-10-CM

## 2021-01-29 DIAGNOSIS — R922 Inconclusive mammogram: Secondary | ICD-10-CM | POA: Diagnosis not present

## 2021-02-12 ENCOUNTER — Encounter (HOSPITAL_COMMUNITY): Payer: BC Managed Care – PPO

## 2021-02-12 ENCOUNTER — Ambulatory Visit (HOSPITAL_COMMUNITY): Payer: BC Managed Care – PPO

## 2021-02-12 DIAGNOSIS — M13862 Other specified arthritis, left knee: Secondary | ICD-10-CM | POA: Diagnosis not present

## 2021-03-15 DIAGNOSIS — Z23 Encounter for immunization: Secondary | ICD-10-CM | POA: Diagnosis not present

## 2021-03-15 DIAGNOSIS — E1165 Type 2 diabetes mellitus with hyperglycemia: Secondary | ICD-10-CM | POA: Diagnosis not present

## 2021-03-15 DIAGNOSIS — C50112 Malignant neoplasm of central portion of left female breast: Secondary | ICD-10-CM | POA: Diagnosis not present

## 2021-03-15 DIAGNOSIS — Z6832 Body mass index (BMI) 32.0-32.9, adult: Secondary | ICD-10-CM | POA: Diagnosis not present

## 2021-03-15 DIAGNOSIS — E6609 Other obesity due to excess calories: Secondary | ICD-10-CM | POA: Diagnosis not present

## 2021-03-15 DIAGNOSIS — E782 Mixed hyperlipidemia: Secondary | ICD-10-CM | POA: Diagnosis not present

## 2021-03-15 DIAGNOSIS — I10 Essential (primary) hypertension: Secondary | ICD-10-CM | POA: Diagnosis not present

## 2021-04-17 DIAGNOSIS — M1712 Unilateral primary osteoarthritis, left knee: Secondary | ICD-10-CM | POA: Diagnosis not present

## 2021-04-18 DIAGNOSIS — Z23 Encounter for immunization: Secondary | ICD-10-CM | POA: Diagnosis not present

## 2021-05-15 DIAGNOSIS — M1712 Unilateral primary osteoarthritis, left knee: Secondary | ICD-10-CM | POA: Diagnosis not present

## 2021-05-15 DIAGNOSIS — M170 Bilateral primary osteoarthritis of knee: Secondary | ICD-10-CM | POA: Diagnosis not present

## 2021-05-17 DIAGNOSIS — C50912 Malignant neoplasm of unspecified site of left female breast: Secondary | ICD-10-CM | POA: Diagnosis not present

## 2021-06-28 DIAGNOSIS — Z23 Encounter for immunization: Secondary | ICD-10-CM | POA: Diagnosis not present

## 2021-10-16 DIAGNOSIS — Z6834 Body mass index (BMI) 34.0-34.9, adult: Secondary | ICD-10-CM | POA: Diagnosis not present

## 2021-10-16 DIAGNOSIS — I10 Essential (primary) hypertension: Secondary | ICD-10-CM | POA: Diagnosis not present

## 2021-10-16 DIAGNOSIS — E6609 Other obesity due to excess calories: Secondary | ICD-10-CM | POA: Diagnosis not present

## 2021-10-16 DIAGNOSIS — E7849 Other hyperlipidemia: Secondary | ICD-10-CM | POA: Diagnosis not present

## 2021-10-16 DIAGNOSIS — E1165 Type 2 diabetes mellitus with hyperglycemia: Secondary | ICD-10-CM | POA: Diagnosis not present

## 2021-10-16 DIAGNOSIS — J4520 Mild intermittent asthma, uncomplicated: Secondary | ICD-10-CM | POA: Diagnosis not present

## 2021-10-16 DIAGNOSIS — E782 Mixed hyperlipidemia: Secondary | ICD-10-CM | POA: Diagnosis not present

## 2021-11-05 ENCOUNTER — Ambulatory Visit: Payer: BC Managed Care – PPO | Admitting: Cardiovascular Disease

## 2021-11-06 ENCOUNTER — Ambulatory Visit (INDEPENDENT_AMBULATORY_CARE_PROVIDER_SITE_OTHER): Payer: BC Managed Care – PPO | Admitting: Cardiovascular Disease

## 2021-11-06 ENCOUNTER — Encounter: Payer: Self-pay | Admitting: Cardiovascular Disease

## 2021-11-06 VITALS — BP 140/89 | HR 78 | Ht 65.0 in | Wt 200.8 lb

## 2021-11-06 DIAGNOSIS — I10 Essential (primary) hypertension: Secondary | ICD-10-CM | POA: Diagnosis not present

## 2021-11-06 DIAGNOSIS — E1169 Type 2 diabetes mellitus with other specified complication: Secondary | ICD-10-CM | POA: Diagnosis not present

## 2021-11-06 DIAGNOSIS — E669 Obesity, unspecified: Secondary | ICD-10-CM

## 2021-11-06 DIAGNOSIS — R002 Palpitations: Secondary | ICD-10-CM | POA: Diagnosis not present

## 2021-11-06 DIAGNOSIS — E7800 Pure hypercholesterolemia, unspecified: Secondary | ICD-10-CM

## 2021-11-06 NOTE — Patient Instructions (Signed)

## 2021-11-06 NOTE — Progress Notes (Signed)
? ?Cardiology Office Note   ? ?Date:  11/06/2021  ? ?ID:  Tracy Knight, DOB 1960-12-17, MRN [id number] ? ?PCP:  Sharilyn Sites, MD  ?Cardiologist:   Sanda Klein, MD  ? ?Chief Complaint  ?Patient presents with  ? Palpitations  ? ? ?History of Present Illness:  ?Tracy Knight is a 61 y.o. female with obesity, hyperlipidemia, type 2 diabetes mellitus, hypertension and palpitations here for routine follow-up.  ? ?She has had a good year.  Palpitations have not bothered her.  Diltiazem is doing a good job of controlling these without causing the increased wheezing that she had with carvedilol.  Unfortunately she has gained back about 5 pounds and is a mildly obese range, but her metabolic control is excellent.  Hemoglobin A1c was 5.8%, LDL 67, HDL 63.  She has normal kidney function.  Blood pressure was little high today when she first checked in, was coming down quickly during the appointment and was only 124/72 a few weeks ago at another office visit. ? ?Its been over a year since she completed treatment for right breast cancer with surgery and radiation therapy. ? ?Exercises on her home stationary bike for 30 minutes at a time, 3-4 times a week, takes care of all her own housework. ? ?The patient specifically denies any chest pain at rest exertion, dyspnea at rest or with exertion, orthopnea, paroxysmal nocturnal dyspnea, syncope, palpitations, focal neurological deficits, intermittent claudication, lower extremity edema, unexplained weight gain, cough, hemoptysis or wheezing. ? ?She had a remote cardiac catheterization in 2005 with normal coronary arteries. Her echo shows normal left ventricular ejection fraction, borderline left atrial enlargement and borderline criteria for mitral valve prolapse, mild mitral insufficiency. Past treatment with carvedilol caused problems with wheezing and asthma ? ? ? ?Past Medical History:  ?Diagnosis Date  ? Asthma   ? Cancer ([place]) 11/2018  ? left breast cancer  ? Diabetes mellitus  without complication ([place])   ? Dyslipidemia   ? History of nuclear stress test 02/2009  ? exercise; normal pattern of perfusion; no significant ischemia demonstrated; low risk scan   ? Hypertension   ? ? ?Past Surgical History:  ?Procedure Laterality Date  ? BREAST RECONSTRUCTION  04/16/2020  ?  Removal left chest tissue expander and placement silicone implant 2. Latissimus dorsi flap to left chest 3. Right breast reduction  ? BREAST RECONSTRUCTION WITH PLACEMENT OF TISSUE EXPANDER AND ALLODERM Left 02/18/2019  ? Procedure: LEFT BREAST RECONSTRUCTION WITH PLACEMENT OF TISSUE EXPANDER AND ALLODERM;  Surgeon: Irene Limbo, MD;  Location: [facility];  Service: Plastics;  Laterality: Left;  ? BREAST REDUCTION SURGERY Right 04/16/2020  ? Procedure: RIGHT BREAST REDUCTION;  Surgeon: Irene Limbo, MD;  Location: [facility];  Service: Plastics;  Laterality: Right;  ? CARDIAC CATHETERIZATION  07/2003  ? no evidence of CAD  ? COLONOSCOPY N/A 02/17/2017  ? Procedure: COLONOSCOPY;  Surgeon: Daneil Dolin, MD;  [facility];  Service: Endoscopy;  Laterality: N/A;  10:00am  ? LATISSIMUS FLAP TO BREAST Left 04/16/2020  ? Procedure: LEFT LATISSIMUS DORSI FLAP TO LEFT CHEST;  Surgeon: Irene Limbo, MD;  Location: [facility];  Service: Plastics;  Laterality: Left;  ? MASTECTOMY W/ SENTINEL NODE BIOPSY Left 02/18/2019  ? Procedure: LEFT MASTECTOMY WITH LEFT AXILLARY SENTINEL LYMPH NODE BIOPSY;  Surgeon: Alphonsa Overall, MD;  Location: [facility];  Service: General;  Laterality: Left;  ? POLYPECTOMY  02/17/2017  ? Procedure: POLYPECTOMY;  Surgeon: Daneil Dolin,  MD;  [facility];  Service: Endoscopy;;  colon  ? removal cyst on chin    ? REMOVAL OF TISSUE EXPANDER AND PLACEMENT OF IMPLANT Left 04/16/2020  ? Procedure: REMOVAL OF LEFT CHEST TISSUE EXPANDER AND PLACEMENT OF IMPLANT;  Surgeon: Irene Limbo, MD;  Location: [facility];  Service: Plastics;  Laterality: Left;  ?  TRANSTHORACIC ECHOCARDIOGRAM  03/2007  ? EF=>55%; borderline LA enlargement; MV mildly thickened/borderline MVP/mild MR  ? ? ?Current Medications: ?Outpatient Medications Prior to Visit  ?Medication Sig Dispense Refill  ? anastrozole (ARIMIDEX) 1 MG tablet TAKE 1 TABLET(1 MG) BY MOUTH DAILY 90 tablet 3  ? aspirin EC 81 MG tablet Take 81 mg by mouth daily. Swallow whole.    ? CALCIUM-VITAMIN D PO Take 1 tablet by mouth daily.    ? cetirizine (ZYRTEC) 10 MG tablet Take 10 mg by mouth daily.    ? Cyanocobalamin (VITAMIN B-12) 2500 MCG SUBL Place 2,500 mcg under the tongue daily.    ? diltiazem (TIAZAC) 360 MG 24 hr capsule TAKE 1 CAPSULE BY MOUTH DAILY (Patient taking differently: Take 360 mg by mouth daily.) 90 capsule 2  ? Ferrous Sulfate Dried 45 MG TBCR Take 45 mg by mouth 2 (two) times a week.     ? fish oil-omega-3 fatty acids 1000 MG capsule Take 1,000 mg by mouth daily.     ? losartan (COZAAR) 100 MG tablet Take 1 tablet (100 mg total) by mouth daily. 90 tablet 3  ? meloxicam (MOBIC) 7.5 MG tablet Take 7.5 mg by mouth every 12 (twelve) hours.    ? montelukast (SINGULAIR) 10 MG tablet Take 10 mg by mouth daily before lunch.     ? Multiple Vitamin (MULTIVITAMIN WITH MINERALS) TABS tablet Take 1 tablet by mouth daily.    ? pravastatin (PRAVACHOL) 40 MG tablet TAKE 1 TABLET(40 MG) BY MOUTH DAILY 90 tablet 3  ? PROAIR HFA 108 (90 Base) MCG/ACT inhaler Inhale 2 puffs into the lungs every 4 (four) hours as needed for wheezing or shortness of breath.   2  ? sitaGLIPtin-metformin (JANUMET) 50-500 MG tablet Janumet 50 mg-500 mg tablet ? TAKE 1 TABLET BY MOUTH TWICE DAILY WITH MEALS    ? triamterene-hydrochlorothiazide (MAXZIDE-25) 37.5-25 MG tablet Take 1 tablet by mouth daily.    ? TURMERIC PO Take 1 capsule by mouth daily.    ? vitamin E 180 MG (400 UNITS) capsule Take 400 Units by mouth daily.    ? ibuprofen (ADVIL) 200 MG tablet Take 400 mg by mouth every 6 (six) hours as needed for moderate pain. (Patient not  taking: Reported on 11/06/2021)    ? linaGLIPtin-metFORMIN HCl (JENTADUETO) 2.5-500 MG TABS Take 1 tablet by mouth in the morning and at bedtime. (Patient not taking: Reported on 11/06/2021) 60 tablet   ? Menthol, Topical Analgesic, (BIOFREEZE EX) Apply 1 application topically 2 (two) times daily as needed (pain). (Patient not taking: Reported on 11/06/2021)    ? ?No facility-administered medications prior to visit.  ?  ? ?Allergies:   Carvedilol  ? ?Social History  ? ?Socioeconomic History  ? Marital status: Married  ?  Spouse name: Not on file  ? Number of children: Not on file  ? Years of education: Not on file  ? Highest education level: Not on file  ?Occupational History  ? Not on file  ?Tobacco Use  ? Smoking status: Never  ? Smokeless tobacco: Never  ? Tobacco comments:  ?  never smoke.  ?  Vaping Use  ? Vaping Use: Never used  ?Substance and Sexual Activity  ? Alcohol use: No  ? Drug use: No  ? Sexual activity: Yes  ?  Birth control/protection: Post-menopausal  ?Other Topics Concern  ? Not on file  ?Social History Narrative  ? Not on file  ? ?Social Determinants of Health  ? ?Financial Resource Strain: Not on file  ?Food Insecurity: Not on file  ?Transportation Needs: Not on file  ?Physical Activity: Not on file  ?Stress: Not on file  ?Social Connections: Not on file  ?  ? ?Family History:  The patient's family history includes Diabetes in her daughter; Heart disease in her father and mother; Hypertension in her daughter and sister.  ? ?ROS:   ?Please see the history of present illness.    ?ROS All other systems are reviewed and are negative. ? ? ?PHYSICAL EXAM:   ?VS:  BP 140/89 (BP Location: Left Arm, Patient Position: Sitting, Cuff Size: Large)   Pulse 78   Ht '5\' 5"'$  (1.651 m)   Wt 200 lb 12.8 oz (91.1 kg)   SpO2 97%   BMI 33.41 kg/m?    ? ? ?General: Alert, oriented x3, no distress, obese ?Head: no evidence of trauma, PERRL, EOMI, no exophtalmos or lid lag, no myxedema, no xanthelasma; normal ears, nose  and oropharynx ?Neck: normal jugular venous pulsations and no hepatojugular reflux; brisk carotid pulses without delay and no carotid bruits ?Chest: clear to auscultation, no signs of consolidation by percussion or palpatio

## 2021-11-16 ENCOUNTER — Other Ambulatory Visit: Payer: Self-pay | Admitting: Cardiovascular Disease

## 2021-11-18 DIAGNOSIS — H35463 Secondary vitreoretinal degeneration, bilateral: Secondary | ICD-10-CM | POA: Diagnosis not present

## 2021-12-25 NOTE — Progress Notes (Signed)
Patient Care Team: Sharilyn Sites, MD as PCP - General (Family Medicine) Sanda Klein, MD as PCP - Cardiology (Cardiology) Alphonsa Overall, MD as Consulting Physician (General Surgery) Nicholas Lose, MD as Consulting Physician (Hematology and Oncology) Kyung Rudd, MD as Consulting Physician (Radiation Oncology) Irene Limbo, MD as Consulting Physician (Plastic Surgery)  DIAGNOSIS:  Encounter Diagnosis  Name Primary?   Malignant neoplasm of overlapping sites of left breast in female, estrogen receptor positive (Tracy Knight)     SUMMARY OF ONCOLOGIC HISTORY: Oncology History  Malignant neoplasm of overlapping sites of left breast in female, estrogen receptor positive (Tracy Knight)  12/17/2018 Initial Diagnosis   Screening mammogram detected 1.5cm left breast mass, left breast distortion, and cortical thickening in left axillary lymph node. Biopsy confirmed invasive mammary carcinoma with mammary carcinoma in situ, grade 2-3, HER2- (1+), ER+ (100%), PR+ (100%), Ki67 10%. No evidence of carcinoma in the left axillary lymph node.    12/22/2018 Cancer Staging   Staging form: Breast, AJCC 8th Edition - Clinical stage from 12/22/2018: Stage IA (cT1c, cN0, cM0, G3, ER+, PR+, HER2-)    11/2018 - 11/2023 Anti-estrogen oral therapy   Anastrozole   02/18/2019 Surgery   Left mastectomy and sentinel lymph node biopsy [name], [place]) [phone]): invasive lobular carcinoma, grade 2, 1.6cm, clear margins. One out of five lymph nodes positive for metastatic carcinoma, 0.5cm, with extranodal extension.    02/25/2019 Cancer Staging   Staging form: Breast, AJCC 8th Edition - Pathologic: Stage IA (pT1c, pN1a, cM0, G2, ER+, PR+, HER2-)     03/11/2019 Oncotype testing   Mammaprint: low risk, 10% of recurrence in 10 years without systemic therapy   04/20/2019 - 06/06/2019 Radiation Therapy   The patient initially received a dose of 50.4 Gy in 28 fractions to the chest wall and supraclavicular region. This was  delivered using a 3-D conformal, 4 field technique. The patient then received a boost to the mastectomy scar. This delivered an additional 10 Gy in 5 fractions using an en face electron field. The total dose was 60.4 Gy.     CHIEF COMPLIANT: Follow-up of left breast cancer on anastrozole    INTERVAL HISTORY: Tracy Knight is a 61 y.o. with above-mentioned history of left breast cancer treated with neoadjuvant anastrozole therapy, left mastectomy, radiation, and is currently on anti-estrogen therapy with anastrozole. She presents to the clinic today for follow-up. She states that her health is good. Have some mild hot flashes on and off. Denies pain in joints just some stiffness. Denies pain and discomfort in breast. Overall she tolerated the anastrozole. She is going to the center using the exercise bike and she also does exercise off her phone. She notice that it helps with the joint stiffness in legs.   ALLERGIES:  is allergic to carvedilol.  MEDICATIONS:  Current Outpatient Medications  Medication Sig Dispense Refill   aspirin EC 81 MG tablet Take 81 mg by mouth daily. Swallow whole.     CALCIUM-VITAMIN D PO Take 1 tablet by mouth daily.     cetirizine (ZYRTEC) 10 MG tablet Take 10 mg by mouth daily.     Cyanocobalamin (VITAMIN B-12) 2500 MCG SUBL Place 2,500 mcg under the tongue daily.     diltiazem (TIAZAC) 360 MG 24 hr capsule TAKE 1 CAPSULE BY MOUTH DAILY (Patient taking differently: Take 360 mg by mouth daily.) 90 capsule 2   fish oil-omega-3 fatty acids 1000 MG capsule Take 1,000 mg by mouth daily.      ibuprofen (ADVIL) 200  MG tablet Take 400 mg by mouth every 6 (six) hours as needed for moderate pain.     losartan (COZAAR) 100 MG tablet Take 1 tablet (100 mg total) by mouth daily. 90 tablet 3   meloxicam (MOBIC) 7.5 MG tablet Take 7.5 mg by mouth every 12 (twelve) hours.     Menthol, Topical Analgesic, (BIOFREEZE EX) Apply 1 application  topically 2 (two) times daily as needed  (pain).     montelukast (SINGULAIR) 10 MG tablet Take 10 mg by mouth daily before lunch.      Multiple Vitamin (MULTIVITAMIN WITH MINERALS) TABS tablet Take 1 tablet by mouth daily.     pravastatin (PRAVACHOL) 40 MG tablet TAKE 1 TABLET(40 MG) BY MOUTH DAILY 90 tablet 3   PROAIR HFA 108 (90 Base) MCG/ACT inhaler Inhale 2 puffs into the lungs every 4 (four) hours as needed for wheezing or shortness of breath.   2   sitaGLIPtin-metformin (JANUMET) 50-500 MG tablet Janumet 50 mg-500 mg tablet  TAKE 1 TABLET BY MOUTH TWICE DAILY WITH MEALS     triamterene-hydrochlorothiazide (MAXZIDE-25) 37.5-25 MG tablet Take 1 tablet by mouth daily.     vitamin E 180 MG (400 UNITS) capsule Take 400 Units by mouth daily.     anastrozole (ARIMIDEX) 1 MG tablet TAKE 1 TABLET(1 MG) BY MOUTH DAILY 90 tablet 3   No current facility-administered medications for this visit.    PHYSICAL EXAMINATION: ECOG PERFORMANCE STATUS: 1 - Symptomatic but completely ambulatory  Vitals:   01/08/22 0955  BP: (!) 151/80  Pulse: 72  Resp: 18  Temp: (!) 97.3 F (36.3 C)  SpO2: 98%   Filed Weights   01/08/22 0955  Weight: 198 lb 3.2 oz (89.9 kg)    BREAST: No palpable masses or nodules in either right  breast.  Left breast has been reconstructed and is without any palpable lumps or nodules.. No palpable axillary supraclavicular or infraclavicular adenopathy no breast tenderness or nipple discharge. (exam performed in the presence of a chaperone)  LABORATORY DATA:  I have reviewed the data as listed    Latest Ref Rng & Units 04/12/2020   11:00 AM 02/15/2019    3:26 PM 12/22/2018    8:38 AM  CMP  Glucose 70 - 99 mg/dL 148  104  119   BUN 8 - 23 mg/dL $Remove'18  14  24   'cFZjIJo$ Creatinine 0.44 - 1.00 mg/dL 0.86  0.84  1.01   Sodium 135 - 145 mmol/L 140  140  141   Potassium 3.5 - 5.1 mmol/L 3.4  3.9  3.2   Chloride 98 - 111 mmol/L 100  101  101   CO2 22 - 32 mmol/L $RemoveB'27  29  30   'JmofKUYf$ Calcium 8.9 - 10.3 mg/dL 10.6  10.0  10.2   Total  Protein 6.5 - 8.1 g/dL   8.3   Total Bilirubin 0.3 - 1.2 mg/dL   0.8   Alkaline Phos 38 - 126 U/L   69   AST 15 - 41 U/L   19   ALT 0 - 44 U/L   18     Lab Results  Component Value Date   WBC 5.7 04/12/2020   HGB 11.9 (L) 04/12/2020   HCT 39.4 04/12/2020   MCV 83.8 04/12/2020   PLT 254 04/12/2020   NEUTROABS 4.0 12/22/2018    ASSESSMENT & PLAN:  Malignant neoplasm of overlapping sites of left breast in female, estrogen receptor positive ([place]) 12/17/2018:Screening mammogram detected 1.5cm left  breast mass, left breast distortion, and cortical thickening in left axillary lymph node. Biopsy confirmed invasive mammary carcinoma with mammary carcinoma in situ, grade 2-3, HER2- (1+), ER+ (100%), PR+ (100%), Ki67 10%. No evidence of carcinoma in the left axillary lymph node.  T1CN0 stage Ia   Treatment summary: 1.  02/17/2019: Left mastectomy and sentinel lymph node biopsy [name], [place]): invasive lobular carcinoma, grade 2, 1.6cm, clear margins. One lymph node positive for metastatic carcinoma, 0.5cm, with extranodal extension. 1/5 LN 2.  MammaPrint testing: Low risk 3. Adjuvant radiation therapy because of lymph node positivity 04/20/2019-06/06/2019 4. Adjuvant antiestrogen therapy with anastrozole started August 2020 -----------------------------------------------------------------  Current treatment: Adjuvant antiestrogen therapy with anastrozole.   Anastrozole toxicities: Slight joint stiffness in the knees.   Breast cancer surveillance: 1.  Mammogram 01/15/2022 2. breast exam 01/08/2022: Benign   Bone density 12/29/2019: T score -1.2: Mild osteopenia  Arthritis: Planning to take sea moss I encouraged her to exercise 30 minutes every day.   Return to clinic in 1 year for follow-up    No orders of the defined types were placed in this encounter.  The patient has a good understanding of the overall plan. she agrees with it. she will call with any problems that may develop  before the next visit here. Total time spent: 30 mins including face to face time and time spent for planning, charting and co-ordination of care   Harriette Ohara, MD 01/08/22    I Gardiner Coins am scribing for Dr. Lindi Adie  I have reviewed the above documentation for accuracy and completeness, and I agree with the above.

## 2022-01-02 ENCOUNTER — Other Ambulatory Visit (HOSPITAL_COMMUNITY): Payer: Self-pay | Admitting: Family Medicine

## 2022-01-02 DIAGNOSIS — Z1231 Encounter for screening mammogram for malignant neoplasm of breast: Secondary | ICD-10-CM

## 2022-01-08 ENCOUNTER — Inpatient Hospital Stay: Payer: BC Managed Care – PPO | Attending: Hematology and Oncology | Admitting: Hematology and Oncology

## 2022-01-08 ENCOUNTER — Other Ambulatory Visit: Payer: Self-pay

## 2022-01-08 DIAGNOSIS — Z79811 Long term (current) use of aromatase inhibitors: Secondary | ICD-10-CM | POA: Diagnosis not present

## 2022-01-08 DIAGNOSIS — Z9012 Acquired absence of left breast and nipple: Secondary | ICD-10-CM | POA: Diagnosis not present

## 2022-01-08 DIAGNOSIS — C50812 Malignant neoplasm of overlapping sites of left female breast: Secondary | ICD-10-CM

## 2022-01-08 DIAGNOSIS — Z170 Estrogen receptor positive status [ER+]: Secondary | ICD-10-CM | POA: Diagnosis not present

## 2022-01-08 MED ORDER — ANASTROZOLE 1 MG PO TABS: ORAL_TABLET | ORAL | 3 refills | Status: DC

## 2022-01-08 NOTE — Assessment & Plan Note (Addendum)
12/17/2018:Screening mammogram detected 1.5cm left breast mass, left breast distortion, and cortical thickening in left axillary lymph node. Biopsy confirmed invasive mammary carcinoma with mammary carcinoma in situ, grade 2-3, HER2- (1+), ER+ (100%), PR+ (100%), Ki67 10%. No evidence of carcinoma in the left axillary lymph node.T1CN0 stage Ia  Treatment summary: 1.02/17/2019:Left mastectomy and sentinel lymph node biopsy [name], [place]): invasive lobular carcinoma, grade 2, 1.6cm, clear margins. One lymph node positive for metastatic carcinoma, 0.5cm, with extranodal extension.1/5 LN 2.MammaPrinttesting: Low risk 3. Adjuvant radiation therapy because of lymph node positivity10/21/2020-06/06/2019 4. Adjuvant antiestrogen therapy with anastrozole started August 2020 -----------------------------------------------------------------  Current treatment:Adjuvant antiestrogen therapy with anastrozole. Patient tolerated anastrozole that was given preoperatively. Anastrozole toxicities: None  Breast cancer surveillance: 1.Mammogram 01/15/2022 2.breast exam 01/08/2022: Benign  Bone density 12/29/2019: T score -1.2: Mild osteopenia  Arthritis: Planning to take sea moss I encouraged her to exercise 30 minutes every day.  Return to clinic in 1 year for follow-up

## 2022-01-09 ENCOUNTER — Encounter: Payer: Self-pay | Admitting: *Deleted

## 2022-01-15 ENCOUNTER — Ambulatory Visit (HOSPITAL_COMMUNITY)
Admission: RE | Admit: 2022-01-15 | Discharge: 2022-01-15 | Disposition: A | Discharge status: Home / Self Care | Payer: BC Managed Care – PPO | Source: Ambulatory Visit | Attending: Family Medicine | Admitting: Family Medicine

## 2022-01-15 DIAGNOSIS — Z1231 Encounter for screening mammogram for malignant neoplasm of breast: Secondary | ICD-10-CM | POA: Insufficient documentation

## 2022-01-23 DIAGNOSIS — Z9012 Acquired absence of left breast and nipple: Secondary | ICD-10-CM | POA: Diagnosis not present

## 2022-01-23 DIAGNOSIS — Z923 Personal history of irradiation: Secondary | ICD-10-CM | POA: Diagnosis not present

## 2022-01-23 DIAGNOSIS — Z853 Personal history of malignant neoplasm of breast: Secondary | ICD-10-CM | POA: Diagnosis not present

## 2022-03-29 DIAGNOSIS — M13862 Other specified arthritis, left knee: Secondary | ICD-10-CM | POA: Diagnosis not present

## 2022-03-29 DIAGNOSIS — M1712 Unilateral primary osteoarthritis, left knee: Secondary | ICD-10-CM | POA: Diagnosis not present

## 2022-04-04 ENCOUNTER — Encounter: Payer: Self-pay | Admitting: *Deleted

## 2022-04-04 NOTE — Patient Instructions (Signed)
Procedure: Colonoscopy  Estimated body mass index is 32.28 kg/m as calculated from the following:   Height as of this encounter: '5\' 5"'$  (1.651 m).   Weight as of this encounter: 194 lb (88 kg).   Have you had a colonoscopy before?  02/17/17, Dr. Gala Romney  Do you have family history of colon cancer  no  Do you have a family history of polyps? no  Previous colonoscopy with polyps removed? no  Do you have a history colorectal cancer?   no  Are you diabetic?  Yes type 2  Do you have a prosthetic or mechanical heart valve? no  Do you have a pacemaker/defibrillator?   no  Have you had endocarditis/atrial fibrillation?  no  Do you use supplemental oxygen/CPAP?  no  Have you had joint replacement within the last 12 months?  no  Do you tend to be constipated or have to use laxatives?  no   Do you have history of alcohol use? If yes, how much and how often.  no  Do you have history or are you using drugs? If yes, what do are you  using?  no  Have you ever had a stroke/heart attack?  no  Have you ever had a heart or other vascular stent placed,?no  Do you take weight loss medication? no  female patients,: have you had a hysterectomy? no                              are you post menopausal?  yes                              do you still have your menstrual cycle? no    Date of last menstrual period.   Do you take any blood-thinning medications such as: (Plavix, aspirin, Coumadin, Aggrenox, Brilinta, Xarelto, Eliquis, Pradaxa, Savaysa or Effient) yes Aspriin  If yes we need the name, milligram, dosage and who is prescribing doctor:               Current Outpatient Medications  Medication Sig Dispense Refill   anastrozole (ARIMIDEX) 1 MG tablet TAKE 1 TABLET(1 MG) BY MOUTH DAILY 90 tablet 3   aspirin EC 81 MG tablet Take 81 mg by mouth daily. Swallow whole.     CALCIUM-VITAMIN D PO Take 1 tablet by mouth daily.     cetirizine (ZYRTEC) 10 MG tablet Take 10 mg by mouth daily.      Cyanocobalamin (VITAMIN B-12) 2500 MCG SUBL Place 2,500 mcg under the tongue daily.     diltiazem (TIAZAC) 360 MG 24 hr capsule TAKE 1 CAPSULE BY MOUTH DAILY (Patient taking differently: Take 360 mg by mouth daily.) 90 capsule 2   fish oil-omega-3 fatty acids 1000 MG capsule Take 1,000 mg by mouth daily.      ibuprofen (ADVIL) 200 MG tablet Take 400 mg by mouth every 6 (six) hours as needed for moderate pain.     losartan (COZAAR) 100 MG tablet Take 1 tablet (100 mg total) by mouth daily. 90 tablet 3   meloxicam (MOBIC) 7.5 MG tablet Take 7.5 mg by mouth every 12 (twelve) hours.     Menthol, Topical Analgesic, (BIOFREEZE EX) Apply 1 application  topically 2 (two) times daily as needed (pain).     montelukast (SINGULAIR) 10 MG tablet Take 10 mg by mouth daily before lunch.  Multiple Vitamin (MULTIVITAMIN WITH MINERALS) TABS tablet Take 1 tablet by mouth daily.     pravastatin (PRAVACHOL) 40 MG tablet TAKE 1 TABLET(40 MG) BY MOUTH DAILY 90 tablet 3   PROAIR HFA 108 (90 Base) MCG/ACT inhaler Inhale 2 puffs into the lungs every 4 (four) hours as needed for wheezing or shortness of breath.   2   sitaGLIPtin-metformin (JANUMET) 50-500 MG tablet Janumet 50 mg-500 mg tablet  TAKE 1 TABLET BY MOUTH TWICE DAILY WITH MEALS     triamterene-hydrochlorothiazide (MAXZIDE-25) 37.5-25 MG tablet Take 1 tablet by mouth daily.     vitamin E 180 MG (400 UNITS) capsule Take 400 Units by mouth daily.     No current facility-administered medications for this visit.    Allergies  Allergen Reactions   Carvedilol Other (See Comments)    Causes inc. In wheezing due to asthma

## 2022-04-15 NOTE — Progress Notes (Signed)
ASA okay to schedule.  Hold Janumet night prior to morning and of procedure.  Be met prior to procedure.

## 2022-04-15 NOTE — Progress Notes (Signed)
LMOVM to call back 

## 2022-04-15 NOTE — Progress Notes (Signed)
ASA 2. Must've missed the button.

## 2022-04-17 DIAGNOSIS — Z23 Encounter for immunization: Secondary | ICD-10-CM | POA: Diagnosis not present

## 2022-04-18 ENCOUNTER — Telehealth: Payer: Self-pay

## 2022-04-18 NOTE — Telephone Encounter (Signed)
Pt called a left a vm stating that she was returning a call.

## 2022-04-21 NOTE — Progress Notes (Signed)
Pt needed morning Monday procedure. Did not have anything available. Advised will call once we get December schedule.

## 2022-04-21 NOTE — Telephone Encounter (Signed)
noted 

## 2022-05-01 DIAGNOSIS — C50112 Malignant neoplasm of central portion of left female breast: Secondary | ICD-10-CM | POA: Diagnosis not present

## 2022-05-01 DIAGNOSIS — E782 Mixed hyperlipidemia: Secondary | ICD-10-CM | POA: Diagnosis not present

## 2022-05-01 DIAGNOSIS — E6609 Other obesity due to excess calories: Secondary | ICD-10-CM | POA: Diagnosis not present

## 2022-05-01 DIAGNOSIS — J4520 Mild intermittent asthma, uncomplicated: Secondary | ICD-10-CM | POA: Diagnosis not present

## 2022-05-01 DIAGNOSIS — I10 Essential (primary) hypertension: Secondary | ICD-10-CM | POA: Diagnosis not present

## 2022-05-01 DIAGNOSIS — B351 Tinea unguium: Secondary | ICD-10-CM | POA: Diagnosis not present

## 2022-05-01 DIAGNOSIS — E1165 Type 2 diabetes mellitus with hyperglycemia: Secondary | ICD-10-CM | POA: Diagnosis not present

## 2022-05-01 DIAGNOSIS — Z6832 Body mass index (BMI) 32.0-32.9, adult: Secondary | ICD-10-CM | POA: Diagnosis not present

## 2022-05-01 DIAGNOSIS — E7849 Other hyperlipidemia: Secondary | ICD-10-CM | POA: Diagnosis not present

## 2022-05-01 DIAGNOSIS — Z0001 Encounter for general adult medical examination with abnormal findings: Secondary | ICD-10-CM | POA: Diagnosis not present

## 2022-05-06 ENCOUNTER — Telehealth (INDEPENDENT_AMBULATORY_CARE_PROVIDER_SITE_OTHER): Payer: Self-pay | Admitting: *Deleted

## 2022-05-06 DIAGNOSIS — Z1211 Encounter for screening for malignant neoplasm of colon: Secondary | ICD-10-CM

## 2022-05-06 MED ORDER — PEG 3350-KCL-NA BICARB-NACL 420 G PO SOLR: 4000.0000 mL | Freq: Once | ORAL | 0 refills | Status: AC

## 2022-05-06 NOTE — Telephone Encounter (Signed)
Spoke with pt. Scheduled for 12/4 at 8:15am. Aware will mail instructions. Rx for prep sent into pharmacy. Aware needs labs prior.

## 2022-05-14 DIAGNOSIS — H3562 Retinal hemorrhage, left eye: Secondary | ICD-10-CM | POA: Diagnosis not present

## 2022-05-14 DIAGNOSIS — H35463 Secondary vitreoretinal degeneration, bilateral: Secondary | ICD-10-CM | POA: Diagnosis not present

## 2022-05-26 ENCOUNTER — Other Ambulatory Visit (HOSPITAL_COMMUNITY)
Admission: RE | Admit: 2022-05-26 | Discharge: 2022-05-26 | Disposition: A | Discharge status: Home / Self Care | Payer: BC Managed Care – PPO | Source: Ambulatory Visit | Attending: Internal Medicine | Admitting: Internal Medicine

## 2022-05-26 DIAGNOSIS — Z1211 Encounter for screening for malignant neoplasm of colon: Secondary | ICD-10-CM | POA: Insufficient documentation

## 2022-05-26 LAB — BASIC METABOLIC PANEL
Anion gap: 10 (ref 5–15)
BUN: 21 mg/dL (ref 8–23)
CO2: 29 mmol/L (ref 22–32)
Calcium: 10.1 mg/dL (ref 8.9–10.3)
Chloride: 100 mmol/L (ref 98–111)
Creatinine, Ser: 0.86 mg/dL (ref 0.44–1.00)
GFR, Estimated: >60 mL/min (ref >60)
Glucose, Bld: 138 mg/dL — ABNORMAL HIGH (ref 70–99)
Potassium: 3.4 mmol/L — ABNORMAL LOW (ref 3.5–5.1)
Sodium: 139 mmol/L (ref 135–145)

## 2022-05-28 ENCOUNTER — Telehealth: Payer: Self-pay | Admitting: *Deleted

## 2022-05-28 NOTE — Telephone Encounter (Signed)
Called pt. Aware new arrival time for 12/4 will now be 7:45am. She voiced understanding.

## 2022-06-02 ENCOUNTER — Ambulatory Visit (HOSPITAL_COMMUNITY)
Admission: RE | Admit: 2022-06-02 | Discharge: 2022-06-02 | Disposition: A | Discharge status: Home / Self Care | Payer: BC Managed Care – PPO | Attending: Internal Medicine | Admitting: Internal Medicine

## 2022-06-02 ENCOUNTER — Encounter (HOSPITAL_COMMUNITY)
Admission: RE | Disposition: A | Discharge status: Home / Self Care | Payer: Self-pay | Source: Home / Self Care | Attending: Internal Medicine

## 2022-06-02 ENCOUNTER — Ambulatory Visit (HOSPITAL_COMMUNITY): Payer: BC Managed Care – PPO | Admitting: Anesthesiology

## 2022-06-02 ENCOUNTER — Other Ambulatory Visit: Payer: Self-pay

## 2022-06-02 ENCOUNTER — Encounter (HOSPITAL_COMMUNITY): Payer: Self-pay | Admitting: Internal Medicine

## 2022-06-02 DIAGNOSIS — Z09 Encounter for follow-up examination after completed treatment for conditions other than malignant neoplasm: Secondary | ICD-10-CM | POA: Diagnosis not present

## 2022-06-02 DIAGNOSIS — Z79899 Other long term (current) drug therapy: Secondary | ICD-10-CM | POA: Insufficient documentation

## 2022-06-02 DIAGNOSIS — E119 Type 2 diabetes mellitus without complications: Secondary | ICD-10-CM | POA: Insufficient documentation

## 2022-06-02 DIAGNOSIS — Z1211 Encounter for screening for malignant neoplasm of colon: Secondary | ICD-10-CM | POA: Diagnosis not present

## 2022-06-02 DIAGNOSIS — Z7984 Long term (current) use of oral hypoglycemic drugs: Secondary | ICD-10-CM | POA: Diagnosis not present

## 2022-06-02 DIAGNOSIS — M1990 Unspecified osteoarthritis, unspecified site: Secondary | ICD-10-CM | POA: Diagnosis not present

## 2022-06-02 DIAGNOSIS — J45909 Unspecified asthma, uncomplicated: Secondary | ICD-10-CM | POA: Diagnosis not present

## 2022-06-02 DIAGNOSIS — K5730 Diverticulosis of large intestine without perforation or abscess without bleeding: Secondary | ICD-10-CM | POA: Insufficient documentation

## 2022-06-02 DIAGNOSIS — Z86010 Personal history of colonic polyps: Secondary | ICD-10-CM | POA: Diagnosis not present

## 2022-06-02 DIAGNOSIS — I10 Essential (primary) hypertension: Secondary | ICD-10-CM | POA: Insufficient documentation

## 2022-06-02 HISTORY — PX: COLONOSCOPY WITH PROPOFOL: SHX5780

## 2022-06-02 LAB — GLUCOSE, CAPILLARY: Glucose-Capillary: 108 mg/dL — ABNORMAL HIGH (ref 70–99)

## 2022-06-02 SURGERY — COLONOSCOPY WITH PROPOFOL
Anesthesia: General

## 2022-06-02 MED ORDER — PROPOFOL 10 MG/ML IV BOLUS
INTRAVENOUS | Status: DC | PRN
  Administered 2022-06-02 (×3): 50 mg via INTRAVENOUS
  Administered 2022-06-02: 30 mg via INTRAVENOUS

## 2022-06-02 MED ORDER — LIDOCAINE HCL (CARDIAC) PF 100 MG/5ML IV SOSY
PREFILLED_SYRINGE | INTRAVENOUS | Status: DC | PRN
  Administered 2022-06-02: 40 mg via INTRAVENOUS

## 2022-06-02 MED ORDER — LACTATED RINGERS IV SOLN
INTRAVENOUS | Status: DC
  Administered 2022-06-02: 1000 mL via INTRAVENOUS

## 2022-06-02 MED ORDER — STERILE WATER FOR IRRIGATION IR SOLN
Status: DC | PRN
  Administered 2022-06-02: .6 mL

## 2022-06-02 NOTE — Anesthesia Postprocedure Evaluation (Signed)
Anesthesia Post Note  Patient: Tracy Knight  Procedure(s) Performed: COLONOSCOPY WITH PROPOFOL  Patient location during evaluation: Phase II Anesthesia Type: General Level of consciousness: awake and alert and oriented Pain management: pain level controlled Vital Signs Assessment: post-procedure vital signs reviewed and stable Respiratory status: spontaneous breathing, nonlabored ventilation and respiratory function stable Cardiovascular status: blood pressure returned to baseline and stable Postop Assessment: no apparent nausea or vomiting Anesthetic complications: no  No notable events documented.   Last Vitals:  Vitals:   06/02/22 0954 06/02/22 0956  BP: (!) 113/54 (!) 113/54  Pulse: 64 (!) 58  Resp: 19 16  Temp: 36.6 C 37.4 C  SpO2: 100% 99%    Last Pain:  Vitals:   06/02/22 0956  TempSrc: Oral  PainSc:                  Ladaisha Portillo C Marli Diego

## 2022-06-02 NOTE — Transfer of Care (Signed)
Immediate Anesthesia Transfer of Care Note  Patient: Tracy Knight  Procedure(s) Performed: COLONOSCOPY WITH PROPOFOL  Patient Location: PACU  Anesthesia Type:General  Level of Consciousness: awake, alert , and oriented  Airway & Oxygen Therapy: Patient Spontanous Breathing  Post-op Assessment: Report given to RN and Post -op Vital signs reviewed and stable  Post vital signs: Reviewed and stable  Last Vitals:  Vitals Value Taken Time  BP 113/54 06/02/22 0954  Temp 36.6 C 06/02/22 0954  Pulse 64 06/02/22 0954  Resp 19 06/02/22 0954  SpO2 100 % 06/02/22 0954    Last Pain:  Vitals:   06/02/22 0954  TempSrc: Oral  PainSc: 0-No pain      Patients Stated Pain Goal: 6 (47/15/95 [id number])  Complications: No notable events documented.

## 2022-06-02 NOTE — Op Note (Signed)
[facility] Patient Name: Tracy Knight Procedure Date: 06/02/2022 9:12 AM MRN: [id number] Date of Birth: Oct 13, 1960 Attending MD: Norvel Richards , MD, [id number] CSN: [id number] Age: 61 Admit Type: Outpatient Procedure:                Colonoscopy Indications:              High risk colon cancer surveillance: Personal                            history of colonic polyps Providers:                Norvel Richards, MD, Janeece Riggers, RN, Ladoris Gene Technician, Technician Referring MD:              Medicines:                Propofol per Anesthesia Complications:            No immediate complications. Estimated Blood Loss:     Estimated blood loss: none. Procedure:                Pre-Anesthesia Assessment:                           - Prior to the procedure, a History and Physical                            was performed, and patient medications and                            allergies were reviewed. The patient's tolerance of                            previous anesthesia was also reviewed. The risks                            and benefits of the procedure and the sedation                            options and risks were discussed with the patient.                            All questions were answered, and informed consent                            was obtained. Prior Anticoagulants: The patient has                            taken no anticoagulant or antiplatelet agents. ASA                            Grade Assessment: III - A patient with severe  systemic disease. After reviewing the risks and                            benefits, the patient was deemed in satisfactory                            condition to undergo the procedure.                           After obtaining informed consent, the colonoscope                            was passed under direct vision. Throughout the                            procedure, the  patient's blood pressure, pulse, and                            oxygen saturations were monitored continuously. The                            [id number]) scope was introduced through the                            anus and advanced to the the cecum, identified by                            appendiceal orifice and ileocecal valve. The                            colonoscopy was performed without difficulty. The                            patient tolerated the procedure well. The quality                            of the bowel preparation was adequate. The                            ileocecal valve, appendiceal orifice, and rectum                            were photographed. Scope In: 9:38:43 AM Scope Out: 9:49:50 AM Scope Withdrawal Time: 0 hours 6 minutes 48 seconds  Total Procedure Duration: 0 hours 11 minutes 7 seconds  Findings:      The perianal and digital rectal examinations were normal.      A few small-mouthed diverticula were found in the sigmoid colon.      The exam was otherwise without abnormality on direct and retroflexion       views. Impression:               - Diverticulosis in the sigmoid colon.                           - The  examination was otherwise normal on direct                            and retroflexion views.                           - No specimens collected. Moderate Sedation:      Moderate (conscious) sedation was personally administered by an       anesthesia professional. The following parameters were monitored: oxygen       saturation, heart rate, blood pressure, respiratory rate, EKG, adequacy       of pulmonary ventilation, and response to care. Recommendation:           - Patient has a contact number available for                            emergencies. The signs and symptoms of potential                            delayed complications were discussed with the                            patient. Return to normal activities tomorrow.                             Written discharge instructions were provided to the                            patient.                           - Advance diet as tolerated.                           - Continue present medications.                           - Repeat colonoscopy in 7 years for surveillance.                           - Return to GI office (date not yet determined). Procedure Code(s):        --- Professional ---                           [phone], Colonoscopy, flexible; diagnostic, including                            collection of specimen(s) by brushing or washing,                            when performed (separate procedure) Diagnosis Code(s):        --- Professional ---                           Z86.010, Personal history of colonic polyps  K57.30, Diverticulosis of large intestine without                            perforation or abscess without bleeding CPT copyright 2022 American [employer] Association. All rights reserved. The codes documented in this report are preliminary and upon coder review may  be revised to meet current compliance requirements. Cristopher Estimable. Chyann Ambrocio, MD Norvel Richards, MD 06/02/2022 9:58:15 AM This report has been signed electronically. Number of Addenda: 0

## 2022-06-02 NOTE — Discharge Instructions (Signed)
  Colonoscopy Discharge Instructions  Read the instructions outlined below and refer to this sheet in the next few weeks. These discharge instructions provide you with general information on caring for yourself after you leave the hospital. Your doctor may also give you specific instructions. While your treatment has been planned according to the most current medical practices available, unavoidable complications occasionally occur. If you have any problems or questions after discharge, call Dr. Gala Romney at [phone]. ACTIVITY You may resume your regular activity, but move at a slower pace for the next 24 hours.  Take frequent rest periods for the next 24 hours.  Walking will help get rid of the air and reduce the bloated feeling in your belly (abdomen).  No driving for 24 hours (because of the medicine (anesthesia) used during the test).   Do not sign any important legal documents or operate any machinery for 24 hours (because of the anesthesia used during the test).  NUTRITION Drink plenty of fluids.  You may resume your normal diet as instructed by your doctor.  Begin with a light meal and progress to your normal diet. Heavy or fried foods are harder to digest and may make you feel sick to your stomach (nauseated).  Avoid alcoholic beverages for 24 hours or as instructed.  MEDICATIONS You may resume your normal medications unless your doctor tells you otherwise.  WHAT YOU CAN EXPECT TODAY Some feelings of bloating in the abdomen.  Passage of more gas than usual.  Spotting of blood in your stool or on the toilet paper.  IF YOU HAD POLYPS REMOVED DURING THE COLONOSCOPY: No aspirin products for 7 days or as instructed.  No alcohol for 7 days or as instructed.  Eat a soft diet for the next 24 hours.  FINDING OUT THE RESULTS OF YOUR TEST Not all test results are available during your visit. If your test results are not back during the visit, make an appointment with your caregiver to find out the  results. Do not assume everything is normal if you have not heard from your caregiver or the medical facility. It is important for you to follow up on all of your test results.  SEEK IMMEDIATE MEDICAL ATTENTION IF: You have more than a spotting of blood in your stool.  Your belly is swollen (abdominal distention).  You are nauseated or vomiting.  You have a temperature over 101.  You have abdominal pain or discomfort that is severe or gets worse throughout the day.     No polyps found today  Diverticulosis information provided  it is recommended you return in 7 years for repeat colonoscopy  At patient request, I called Izell Savannah at [phone] -  reviewed findings and recommendations

## 2022-06-02 NOTE — Anesthesia Preprocedure Evaluation (Addendum)
Anesthesia Evaluation  Patient identified by MRN, date of birth, ID band Patient awake    Reviewed: Allergy & Precautions, H&P , NPO status , Patient's Chart, lab work & pertinent test results  History of Anesthesia Complications Negative for: history of anesthetic complications  Airway Mallampati: II  TM Distance: >3 FB Neck ROM: Full    Dental  (+) Dental Advisory Given, Missing   Pulmonary asthma , neg COPD,  COPD inhaler   Pulmonary exam normal breath sounds clear to auscultation       Cardiovascular hypertension, Pt. on medications Normal cardiovascular exam Rhythm:Regular Rate:Normal     Neuro/Psych negative neurological ROS  negative psych ROS   GI/Hepatic negative GI ROS, Neg liver ROS,,,  Endo/Other  diabetes, Well Controlled, Type 2, Oral Hypoglycemic Agents    Renal/GU negative Renal ROS  negative genitourinary   Musculoskeletal  (+) Arthritis , Osteoarthritis,    Abdominal   Peds negative pediatric ROS (+)  Hematology negative hematology ROS (+)   Anesthesia Other Findings   Reproductive/Obstetrics negative OB ROS                             Anesthesia Physical Anesthesia Plan  ASA: 2  Anesthesia Plan: General   Post-op Pain Management: Minimal or no pain anticipated   Induction: Intravenous  PONV Risk Score and Plan: 1 and Propofol infusion  Airway Management Planned: Nasal Cannula and Natural Airway  Additional Equipment:   Intra-op Plan:   Post-operative Plan:   Informed Consent: I have reviewed the patients History and Physical, chart, labs and discussed the procedure including the risks, benefits and alternatives for the proposed anesthesia with the patient or authorized representative who has indicated his/her understanding and acceptance.     Dental advisory given  Plan Discussed with: CRNA and Surgeon  Anesthesia Plan Comments:         Anesthesia Quick Evaluation

## 2022-06-02 NOTE — H&P (Signed)
$'@LOGO'S$ @   Primary Care Physician:  Sharilyn Sites, MD Primary Gastroenterologist:  Dr. Gala Romney  Pre-Procedure History & Physical: HPI:  Tracy Knight is a 61 y.o. female is here for a  surveillance colonoscopy.   history of multiple colonic adenomas removed over time.  Past Medical History:  Diagnosis Date   Asthma    Cancer ([place]) 11/2018   left breast cancer   Diabetes mellitus without complication ([place])    Dyslipidemia    History of nuclear stress test 02/2009   exercise; normal pattern of perfusion; no significant ischemia demonstrated; low risk scan    Hypertension     Past Surgical History:  Procedure Laterality Date   BREAST RECONSTRUCTION  04/16/2020    Removal left chest tissue expander and placement silicone implant 2. Latissimus dorsi flap to left chest 3. Right breast reduction   BREAST RECONSTRUCTION WITH PLACEMENT OF TISSUE EXPANDER AND ALLODERM Left 02/18/2019   Procedure: LEFT BREAST RECONSTRUCTION WITH PLACEMENT OF TISSUE EXPANDER AND ALLODERM;  Surgeon: Irene Limbo, MD;  Location: [place];  Service: Plastics;  Laterality: Left;   BREAST REDUCTION SURGERY Right 04/16/2020   Procedure: RIGHT BREAST REDUCTION;  Surgeon: Irene Limbo, MD;  Location: [facility];  Service: Plastics;  Laterality: Right;   CARDIAC CATHETERIZATION  07/2003   no evidence of CAD   COLONOSCOPY N/A 02/17/2017   Procedure: COLONOSCOPY;  Surgeon: Daneil Dolin, MD;  [facility];  Service: Endoscopy;  Laterality: N/A;  10:00am   LATISSIMUS FLAP TO BREAST Left 04/16/2020   Procedure: LEFT LATISSIMUS DORSI FLAP TO LEFT CHEST;  Surgeon: Irene Limbo, MD;  Location: [place];  Service: Plastics;  Laterality: Left;   MASTECTOMY W/ SENTINEL NODE BIOPSY Left 02/18/2019   Procedure: LEFT MASTECTOMY WITH LEFT AXILLARY SENTINEL LYMPH NODE BIOPSY;  Surgeon: Alphonsa Overall, MD;  Location: [place];  Service: General;  Laterality: Left;   POLYPECTOMY  02/17/2017    Procedure: POLYPECTOMY;  Surgeon: Daneil Dolin, MD;  [facility];  Service: Endoscopy;;  colon   removal cyst on chin     REMOVAL OF TISSUE EXPANDER AND PLACEMENT OF IMPLANT Left 04/16/2020   Procedure: REMOVAL OF LEFT CHEST TISSUE EXPANDER AND PLACEMENT OF IMPLANT;  Surgeon: Irene Limbo, MD;  Location: [place];  Service: Plastics;  Laterality: Left;   TRANSTHORACIC ECHOCARDIOGRAM  03/2007   EF=>55%; borderline LA enlargement; MV mildly thickened/borderline MVP/mild MR    Prior to Admission medications   Medication Sig Start Date End Date Taking? Authorizing Provider  acetaminophen (TYLENOL) 650 MG CR tablet Take 650 mg by mouth every 8 (eight) hours as needed for pain.   Yes [provider]  anastrozole (ARIMIDEX) 1 MG tablet TAKE 1 TABLET(1 MG) BY MOUTH DAILY 01/08/22  Yes Nicholas Lose, MD  aspirin EC 81 MG tablet Take 81 mg by mouth daily. Swallow whole.   Yes [provider]  CALCIUM-VITAMIN D PO Take 1 tablet by mouth daily.   Yes [provider]  cetirizine (ZYRTEC) 10 MG tablet Take 10 mg by mouth daily.   Yes [provider]  Cyanocobalamin (VITAMIN B-12) 2500 MCG SUBL Place 2,500 mcg under the tongue daily.   Yes [provider]  diltiazem (TIAZAC) 360 MG 24 hr capsule TAKE 1 CAPSULE BY MOUTH DAILY Patient taking differently: Take 360 mg by mouth daily. 06/19/16  Yes Croitoru, Mihai, MD  fish oil-omega-3 fatty acids 1000 MG capsule Take 1,000 mg by mouth daily.  Yes [provider]  ibuprofen (ADVIL) 200 MG tablet Take 400 mg by mouth every 6 (six) hours as needed for moderate pain.   Yes [provider]  losartan (COZAAR) 100 MG tablet Take 1 tablet (100 mg total) by mouth daily. 08/01/20  Yes Croitoru, Mihai, MD  meloxicam (MOBIC) 7.5 MG tablet Take 7.5 mg by mouth daily as needed for pain. 08/23/21  Yes [provider]  Menthol, Topical Analgesic, (BIOFREEZE EX) Apply 1 application   topically 2 (two) times daily as needed (pain).   Yes [provider]  montelukast (SINGULAIR) 10 MG tablet Take 10 mg by mouth daily before lunch.    Yes [provider]  Multiple Vitamin (MULTIVITAMIN WITH MINERALS) TABS tablet Take 1 tablet by mouth daily.   Yes [provider]  pravastatin (PRAVACHOL) 40 MG tablet TAKE 1 TABLET(40 MG) BY MOUTH DAILY 11/18/21  Yes Croitoru, Mihai, MD  PROAIR HFA 108 (90 Base) MCG/ACT inhaler Inhale 2 puffs into the lungs every 4 (four) hours as needed for wheezing or shortness of breath.  07/27/17  Yes [provider]  sitaGLIPtin-metformin (JANUMET) 50-500 MG tablet Take 1 tablet by mouth 2 (two) times daily with a meal. 01/16/21  Yes [provider]  triamterene-hydrochlorothiazide (MAXZIDE-25) 37.5-25 MG tablet Take 1 tablet by mouth daily.   Yes [provider]  vitamin E 180 MG (400 UNITS) capsule Take 400 Units by mouth daily.   Yes [provider]    Allergies as of 05/06/2022 - Review Complete 01/08/2022  Allergen Reaction Noted   Carvedilol Other (See Comments) 02/11/2019    Family History  Problem Relation Age of Onset   Heart disease Mother    Heart disease Father    Hypertension Sister    Hypertension Daughter    Diabetes Daughter     Social History   Socioeconomic History   Marital status: Married    Spouse name: Not on file   Number of children: Not on file   Years of education: Not on file   Highest education level: Not on file  Occupational History   Not on file  Tobacco Use   Smoking status: Never   Smokeless tobacco: Never   Tobacco comments:    never smoke.  Vaping Use   Vaping Use: Never used  Substance and Sexual Activity   Alcohol use: No   Drug use: No   Sexual activity: Yes    Birth control/protection: Post-menopausal  Other Topics Concern   Not on file  Social History Narrative   Not on file   Social Determinants of Health   Financial Resource  Strain: Low Risk  (09/27/2020)   Overall Financial Resource Strain (CARDIA)    Difficulty of Paying Living Expenses: Not hard at all  Food Insecurity: No Food Insecurity (09/27/2020)   Hunger Vital Sign    Worried About Running Out of Food in the Last Year: Never true    Ran Out of Food in the Last Year: Never true  Transportation Needs: No Transportation Needs (09/27/2020)   PRAPARE - Hydrologist (Medical): No    Lack of Transportation (Non-Medical): No  Physical Activity: Insufficiently Active (09/27/2020)   Exercise Vital Sign    Days of Exercise per Week: 3 days    Minutes of Exercise per Session: 20 min  Stress: No Stress Concern Present (09/27/2020)   [place]    Feeling of Stress :  Not at all  Social Connections: Moderately Integrated (09/27/2020)   Social Connection and Isolation Panel [NHANES]    Frequency of Communication with Friends and Family: More than three times a week    Frequency of Social Gatherings with Friends and Family: Once a week    Attends Religious Services: More than 4 times per year    Active Member of [employer] or Organizations: No    Attends Archivist Meetings: Never    Marital Status: Married  Human resources officer Violence: Not At Risk (09/27/2020)   Humiliation, Afraid, Rape, and Kick questionnaire    Fear of Current or Ex-Partner: No    Emotionally Abused: No    Physically Abused: No    Sexually Abused: No    Review of Systems: See HPI, otherwise negative ROS  Physical Exam: BP (!) 158/83   Pulse 73   Temp 98.2 F (36.8 C) (Oral)   Resp 17   Ht '5\' 5"'$  (1.651 m)   Wt 87.1 kg   SpO2 98%   BMI 31.95 kg/m  General:   Alert,  Well-developed, well-nourished, pleasant and cooperative in NAD Lungs:  Clear throughout to auscultation.   No wheezes, crackles, or rhonchi. No acute distress. Heart:  Regular rate and rhythm; no murmurs, clicks, rubs,  or  gallops. Abdomen:  Soft, nontender and nondistended. No masses, hepatosplenomegaly or hernias noted. Normal bowel sounds, without guarding, and without rebound.    Impression/Plan: Tracy Knight is now here to undergo a  Surveillance colonoscopy. Risks, benefits, limitations, imponderables and alternatives regarding colonoscopy have been reviewed with the patient. Questions have been answered. All parties agreeable.     Notice:  This dictation was prepared with Dragon dictation along with smaller phrase technology. Any transcriptional errors that result from this process are unintentional and may not be corrected upon review.

## 2022-06-09 ENCOUNTER — Encounter (HOSPITAL_COMMUNITY): Payer: Self-pay | Admitting: Internal Medicine

## 2022-06-24 DIAGNOSIS — C50912 Malignant neoplasm of unspecified site of left female breast: Secondary | ICD-10-CM | POA: Diagnosis not present

## 2022-08-27 DIAGNOSIS — M1712 Unilateral primary osteoarthritis, left knee: Secondary | ICD-10-CM | POA: Diagnosis not present

## 2022-11-18 DIAGNOSIS — I10 Essential (primary) hypertension: Secondary | ICD-10-CM | POA: Diagnosis not present

## 2022-11-18 DIAGNOSIS — E782 Mixed hyperlipidemia: Secondary | ICD-10-CM | POA: Diagnosis not present

## 2022-11-18 DIAGNOSIS — Z6832 Body mass index (BMI) 32.0-32.9, adult: Secondary | ICD-10-CM | POA: Diagnosis not present

## 2022-11-18 DIAGNOSIS — E6609 Other obesity due to excess calories: Secondary | ICD-10-CM | POA: Diagnosis not present

## 2022-11-18 DIAGNOSIS — E119 Type 2 diabetes mellitus without complications: Secondary | ICD-10-CM | POA: Diagnosis not present

## 2022-11-18 DIAGNOSIS — E7849 Other hyperlipidemia: Secondary | ICD-10-CM | POA: Diagnosis not present

## 2022-11-18 DIAGNOSIS — E1165 Type 2 diabetes mellitus with hyperglycemia: Secondary | ICD-10-CM | POA: Diagnosis not present

## 2022-11-28 DIAGNOSIS — H35463 Secondary vitreoretinal degeneration, bilateral: Secondary | ICD-10-CM | POA: Diagnosis not present

## 2022-12-02 DIAGNOSIS — R030 Elevated blood-pressure reading, without diagnosis of hypertension: Secondary | ICD-10-CM | POA: Diagnosis not present

## 2022-12-02 DIAGNOSIS — Z6834 Body mass index (BMI) 34.0-34.9, adult: Secondary | ICD-10-CM | POA: Diagnosis not present

## 2022-12-02 DIAGNOSIS — E669 Obesity, unspecified: Secondary | ICD-10-CM | POA: Diagnosis not present

## 2022-12-02 DIAGNOSIS — M62831 Muscle spasm of calf: Secondary | ICD-10-CM | POA: Diagnosis not present

## 2022-12-08 DIAGNOSIS — M1712 Unilateral primary osteoarthritis, left knee: Secondary | ICD-10-CM | POA: Diagnosis not present

## 2022-12-10 ENCOUNTER — Other Ambulatory Visit (HOSPITAL_COMMUNITY): Payer: Self-pay | Admitting: Family Medicine

## 2022-12-10 DIAGNOSIS — Z1231 Encounter for screening mammogram for malignant neoplasm of breast: Secondary | ICD-10-CM

## 2022-12-26 DIAGNOSIS — M170 Bilateral primary osteoarthritis of knee: Secondary | ICD-10-CM | POA: Diagnosis not present

## 2023-01-10 DIAGNOSIS — M5432 Sciatica, left side: Secondary | ICD-10-CM | POA: Diagnosis not present

## 2023-01-10 NOTE — Progress Notes (Signed)
Patient Care Team: Assunta Found, MD as PCP - General (Family Medicine) Thurmon Fair, MD as PCP - Cardiology (Cardiology) Ovidio Kin, MD as Consulting Physician (General Surgery) Serena Croissant, MD as Consulting Physician (Hematology and Oncology) Dorothy Puffer, MD as Consulting Physician (Radiation Oncology) Glenna Fellows, MD as Consulting Physician (Plastic Surgery)  DIAGNOSIS:  Encounter Diagnoses  Name Primary?   Malignant neoplasm of overlapping sites of left breast in female, estrogen receptor positive (HCC) Yes   Postmenopausal     SUMMARY OF ONCOLOGIC HISTORY: Oncology History  Malignant neoplasm of overlapping sites of left breast in female, estrogen receptor positive (HCC)  12/17/2018 Initial Diagnosis   Screening mammogram detected 1.5cm left breast mass, left breast distortion, and cortical thickening in left axillary lymph node. Biopsy confirmed invasive mammary carcinoma with mammary carcinoma in situ, grade 2-3, HER2- (1+), ER+ (100%), PR+ (100%), Ki67 10%. No evidence of carcinoma in the left axillary lymph node.    12/22/2018 Cancer Staging   Staging form: Breast, AJCC 8th Edition - Clinical stage from 12/22/2018: Stage IA (cT1c, cN0, cM0, G3, ER+, PR+, HER2-)    11/2018 - 11/2023 Anti-estrogen oral therapy   Anastrozole   02/18/2019 Surgery   Left mastectomy and sentinel lymph node biopsy Ezzard Standing, [place]) [phone]): invasive lobular carcinoma, grade 2, 1.6cm, clear margins. One out of five lymph nodes positive for metastatic carcinoma, 0.5cm, with extranodal extension.    02/25/2019 Cancer Staging   Staging form: Breast, AJCC 8th Edition - Pathologic: Stage IA (pT1c, pN1a, cM0, G2, ER+, PR+, HER2-)     03/11/2019 Oncotype testing   Mammaprint: low risk, 10% of recurrence in 10 years without systemic therapy   04/20/2019 - 06/06/2019 Radiation Therapy   The patient initially received a dose of 50.4 Gy in 28 fractions to the chest wall and  supraclavicular region. This was delivered using a 3-D conformal, 4 field technique. The patient then received a boost to the mastectomy scar. This delivered an additional 10 Gy in 5 fractions using an en face electron field. The total dose was 60.4 Gy.     CHIEF COMPLIANT: Follow-up of left breast cancer on anastrozole   INTERVAL HISTORY: Tracy Knight is a 62 y.o. with above-mentioned history of left breast cancer treated with neoadjuvant anastrozole therapy, left mastectomy, radiation, and is currently on anti-estrogen therapy with anastrozole. She presents to the clinic for a follow-up. Pt reports that she is getting ready to have a knee replacement on left knee.    ALLERGIES:  is allergic to carvedilol.  MEDICATIONS:  Current Outpatient Medications  Medication Sig Dispense Refill   aspirin EC 81 MG tablet Take 81 mg by mouth daily. Swallow whole.     CALCIUM-VITAMIN D PO Take 1 tablet by mouth daily.     cetirizine (ZYRTEC) 10 MG tablet Take 10 mg by mouth daily.     Cyanocobalamin (VITAMIN B-12) 2500 MCG SUBL Place 2,500 mcg under the tongue daily.     cyclobenzaprine (FLEXERIL) 5 MG tablet TAKE 1 TABLET BY MOUTH THREE TIMES DAILY FOR 7 DAYS AS NEEDED FOR MUSCLE SPASM     diclofenac (VOLTAREN) 75 MG EC tablet Take 1 tablet by mouth 2 (two) times daily.     diltiazem (TIAZAC) 360 MG 24 hr capsule TAKE 1 CAPSULE BY MOUTH DAILY (Patient taking differently: Take 360 mg by mouth daily.) 90 capsule 2   fish oil-omega-3 fatty acids 1000 MG capsule Take 1,000 mg by mouth daily.      losartan (COZAAR)  100 MG tablet Take 1 tablet (100 mg total) by mouth daily. 90 tablet 3   meloxicam (MOBIC) 7.5 MG tablet Take 7.5 mg by mouth daily as needed for pain.     Menthol, Topical Analgesic, (BIOFREEZE EX) Apply 1 application  topically 2 (two) times daily as needed (pain).     montelukast (SINGULAIR) 10 MG tablet Take 10 mg by mouth daily before lunch.      Multiple Vitamin (MULTIVITAMIN WITH MINERALS)  TABS tablet Take 1 tablet by mouth daily.     pravastatin (PRAVACHOL) 40 MG tablet TAKE 1 TABLET(40 MG) BY MOUTH DAILY 90 tablet 3   PROAIR HFA 108 (90 Base) MCG/ACT inhaler Inhale 2 puffs into the lungs every 4 (four) hours as needed for wheezing or shortness of breath.   2   sitaGLIPtin-metformin (JANUMET) 50-500 MG tablet Take 1 tablet by mouth 2 (two) times daily with a meal.     triamterene-hydrochlorothiazide (MAXZIDE-25) 37.5-25 MG tablet Take 1 tablet by mouth daily.     vitamin E 180 MG (400 UNITS) capsule Take 400 Units by mouth daily.     acetaminophen (TYLENOL) 650 MG CR tablet Take 650 mg by mouth every 8 (eight) hours as needed for pain.     anastrozole (ARIMIDEX) 1 MG tablet TAKE 1 TABLET(1 MG) BY MOUTH DAILY 90 tablet 3   ibuprofen (ADVIL) 200 MG tablet Take 400 mg by mouth every 6 (six) hours as needed for moderate pain.     No current facility-administered medications for this visit.    PHYSICAL EXAMINATION: ECOG PERFORMANCE STATUS: 1 - Symptomatic but completely ambulatory  Vitals:   01/12/23 1024  BP: (!) 151/74  Pulse: 73  Resp: 18  Temp: (!) 97.2 F (36.2 C)  SpO2: 98%   Filed Weights   01/12/23 1024  Weight: 185 lb (83.9 kg)    BREAST: No palpable masses or nodules in either right or left reconstructed breasts. No palpable axillary supraclavicular or infraclavicular adenopathy no breast tenderness or nipple discharge. (exam performed in the presence of a chaperone)  LABORATORY DATA:  I have reviewed the data as listed    Latest Ref Rng & Units 05/26/2022   12:20 PM 04/12/2020   11:00 AM 02/15/2019    3:26 PM  CMP  Glucose 70 - 99 mg/dL 213  086  578   BUN 8 - 23 mg/dL 21  18  14    Creatinine 0.44 - 1.00 mg/dL 4.69  6.29  5.28   Sodium 135 - 145 mmol/L 139  140  140   Potassium 3.5 - 5.1 mmol/L 3.4  3.4  3.9   Chloride 98 - 111 mmol/L 100  100  101   CO2 22 - 32 mmol/L 29  27  29    Calcium 8.9 - 10.3 mg/dL 41.3  24.4  01.0     Lab Results   Component Value Date   WBC 5.7 04/12/2020   HGB 11.9 (L) 04/12/2020   HCT 39.4 04/12/2020   MCV 83.8 04/12/2020   PLT 254 04/12/2020   NEUTROABS 4.0 12/22/2018    ASSESSMENT & PLAN:  Malignant neoplasm of overlapping sites of left breast in female, estrogen receptor positive (HCC) 12/17/2018:Screening mammogram detected 1.5cm left breast mass, left breast distortion, and cortical thickening in left axillary lymph node. Biopsy confirmed invasive mammary carcinoma with mammary carcinoma in situ, grade 2-3, HER2- (1+), ER+ (100%), PR+ (100%), Ki67 10%. No evidence of carcinoma in the left axillary lymph node.  T1CN0 stage  Ia   Treatment summary: 1.  02/17/2019: Left mastectomy and sentinel lymph node biopsy Ezzard Standing, [place]): invasive lobular carcinoma, grade 2, 1.6cm, clear margins. One lymph node positive for metastatic carcinoma, 0.5cm, with extranodal extension. 1/5 LN 2.  MammaPrint testing: Low risk 3. Adjuvant radiation therapy because of lymph node positivity 04/20/2019-06/06/2019 4. Adjuvant antiestrogen therapy with anastrozole started August 2020 -----------------------------------------------------------------   Current treatment: Adjuvant antiestrogen therapy with anastrozole.   Anastrozole toxicities: Slight joint stiffness in the knees.   Breast cancer surveillance: 1.  Mammogram scheduled for 01/19/2023 2. breast exam 01/12/2023: Benign   Bone density 12/29/2019: T score -1.2: Mild osteopenia We will obtain another bone density test before her next visit in 1 year  Arthritis: Planning to undergo left knee replacement surgery We will look at the bone density test to make a final decision on duration of antiestrogen therapy. Return to clinic in 1 year for follow-up    Orders Placed This Encounter  Procedures   DG Bone Density    Standing Status:   Future    Standing Expiration Date:   01/12/2024    Order Specific Question:   Reason for Exam (SYMPTOM  OR DIAGNOSIS  REQUIRED)    Answer:   postmenopasal    Order Specific Question:   Preferred imaging location?    Answer:   [facility]   The patient has a good understanding of the overall plan. she agrees with it. she will call with any problems that may develop before the next visit here. Total time spent: 30 mins including face to face time and time spent for planning, charting and co-ordination of care   Tamsen Meek, MD 01/12/23    I Janan Ridge am acting as a Neurosurgeon for [employer]  I have reviewed the above documentation for accuracy and completeness, and I agree with the above.

## 2023-01-12 ENCOUNTER — Other Ambulatory Visit: Payer: Self-pay

## 2023-01-12 ENCOUNTER — Inpatient Hospital Stay: Payer: BC Managed Care – PPO | Attending: Hematology and Oncology | Admitting: Hematology and Oncology

## 2023-01-12 VITALS — BP 151/74 | HR 73 | Temp 97.2°F | Resp 18 | Ht 65.0 in | Wt 185.0 lb

## 2023-01-12 DIAGNOSIS — M8580 Other specified disorders of bone density and structure, unspecified site: Secondary | ICD-10-CM | POA: Diagnosis not present

## 2023-01-12 DIAGNOSIS — C50812 Malignant neoplasm of overlapping sites of left female breast: Secondary | ICD-10-CM | POA: Insufficient documentation

## 2023-01-12 DIAGNOSIS — Z780 Asymptomatic menopausal state: Secondary | ICD-10-CM | POA: Diagnosis not present

## 2023-01-12 DIAGNOSIS — Z170 Estrogen receptor positive status [ER+]: Secondary | ICD-10-CM | POA: Insufficient documentation

## 2023-01-12 DIAGNOSIS — Z9012 Acquired absence of left breast and nipple: Secondary | ICD-10-CM | POA: Diagnosis not present

## 2023-01-12 DIAGNOSIS — Z79811 Long term (current) use of aromatase inhibitors: Secondary | ICD-10-CM | POA: Diagnosis not present

## 2023-01-12 MED ORDER — ANASTROZOLE 1 MG PO TABS: ORAL_TABLET | ORAL | 3 refills | Status: DC

## 2023-01-12 NOTE — Assessment & Plan Note (Addendum)
12/17/2018:Screening mammogram detected 1.5cm left breast mass, left breast distortion, and cortical thickening in left axillary lymph node. Biopsy confirmed invasive mammary carcinoma with mammary carcinoma in situ, grade 2-3, HER2- (1+), ER+ (100%), PR+ (100%), Ki67 10%. No evidence of carcinoma in the left axillary lymph node.  T1CN0 stage Ia   Treatment summary: 1.  02/17/2019: Left mastectomy and sentinel lymph node biopsy Ezzard Standing, [place]): invasive lobular carcinoma, grade 2, 1.6cm, clear margins. One lymph node positive for metastatic carcinoma, 0.5cm, with extranodal extension. 1/5 LN 2.  MammaPrint testing: Low risk 3. Adjuvant radiation therapy because of lymph node positivity 04/20/2019-06/06/2019 4. Adjuvant antiestrogen therapy with anastrozole started August 2020 -----------------------------------------------------------------   Current treatment: Adjuvant antiestrogen therapy with anastrozole.   Anastrozole toxicities: Slight joint stiffness in the knees.   Breast cancer surveillance: 1.  Mammogram scheduled for 01/19/2023 2. breast exam 01/12/2023: Benign   Bone density 12/29/2019: T score -1.2: Mild osteopenia We will obtain another bone density test before her next visit in 1 year  Arthritis: Planning to undergo left knee replacement surgery We will look at the bone density test to make a final decision on duration of antiestrogen therapy. Return to clinic in 1 year for follow-up

## 2023-01-13 DIAGNOSIS — M545 Low back pain, unspecified: Secondary | ICD-10-CM | POA: Diagnosis not present

## 2023-01-14 DIAGNOSIS — M545 Low back pain, unspecified: Secondary | ICD-10-CM | POA: Diagnosis not present

## 2023-01-16 DIAGNOSIS — M21372 Foot drop, left foot: Secondary | ICD-10-CM | POA: Diagnosis not present

## 2023-01-16 DIAGNOSIS — M5126 Other intervertebral disc displacement, lumbar region: Secondary | ICD-10-CM | POA: Diagnosis not present

## 2023-01-16 DIAGNOSIS — M5451 Vertebrogenic low back pain: Secondary | ICD-10-CM | POA: Diagnosis not present

## 2023-01-19 ENCOUNTER — Ambulatory Visit (HOSPITAL_COMMUNITY)
Admission: RE | Admit: 2023-01-19 | Discharge: 2023-01-19 | Disposition: A | Discharge status: Home / Self Care | Payer: BC Managed Care – PPO | Source: Ambulatory Visit | Attending: Family Medicine | Admitting: Family Medicine

## 2023-01-19 DIAGNOSIS — I10 Essential (primary) hypertension: Secondary | ICD-10-CM | POA: Diagnosis not present

## 2023-01-19 DIAGNOSIS — Z6831 Body mass index (BMI) 31.0-31.9, adult: Secondary | ICD-10-CM | POA: Diagnosis not present

## 2023-01-19 DIAGNOSIS — Z1231 Encounter for screening mammogram for malignant neoplasm of breast: Secondary | ICD-10-CM | POA: Diagnosis not present

## 2023-01-19 DIAGNOSIS — E6609 Other obesity due to excess calories: Secondary | ICD-10-CM | POA: Diagnosis not present

## 2023-01-19 DIAGNOSIS — J4520 Mild intermittent asthma, uncomplicated: Secondary | ICD-10-CM | POA: Diagnosis not present

## 2023-01-19 DIAGNOSIS — E785 Hyperlipidemia, unspecified: Secondary | ICD-10-CM | POA: Diagnosis not present

## 2023-01-19 DIAGNOSIS — Z01818 Encounter for other preprocedural examination: Secondary | ICD-10-CM | POA: Diagnosis not present

## 2023-01-19 DIAGNOSIS — E1159 Type 2 diabetes mellitus with other circulatory complications: Secondary | ICD-10-CM | POA: Diagnosis not present

## 2023-01-22 ENCOUNTER — Ambulatory Visit (HOSPITAL_COMMUNITY): Payer: Self-pay | Admitting: Orthopedic Surgery

## 2023-02-13 NOTE — Pre-Procedure Instructions (Signed)
Surgical Instructions   Your procedure is scheduled on February 26, 2023. Report to [facility] Main Entrance "A" at 12:00 P.M., then check in with the Admitting office. Any questions or running late day of surgery: call [phone]  Questions prior to your surgery date: call [phone], Monday-Friday, 8am-4pm. If you experience any cold or flu symptoms such as cough, fever, chills, shortness of breath, etc. between now and your scheduled surgery, please notify us at the above number.     Remember:  Do not eat after midnight the night before your surgery  You may drink clear liquids until 11:00 AM the morning of your surgery.   Clear liquids allowed are: Water, Non-Citrus Juices (without pulp), Carbonated Beverages, Clear Tea, Black Coffee Only (NO MILK, CREAM OR POWDERED CREAMER of any kind), and Gatorade.    Take these medicines the morning of surgery with A SIP OF WATER: anastrozole (ARIMIDEX)  cetirizine (ZYRTEC)  diltiazem (TIAZAC)  montelukast (SINGULAIR)   May take these medicines IF NEEDED: cyclobenzaprine (FLEXERIL)  PROAIR HFA 108 (90 Base) inhaler    Follow your surgeon's instructions on when to stop Aspirin.  If no instructions were given by your surgeon then you will need to call the office to get those instructions.     One week prior to surgery, STOP taking any Aleve, Naproxen, Ibuprofen, Motrin, Advil, Goody's, BC's, all herbal medications, fish oil, and non-prescription vitamins. This includes your medication: diclofenac (VOLTAREN) tablet and meloxicam (MOBIC)    WHAT DO I DO ABOUT MY DIABETES MEDICATION?   Do not take sitaGLIPtin-metformin (JANUMET) the morning of surgery.   HOW TO MANAGE YOUR DIABETES BEFORE AND AFTER SURGERY  Why is it important to control my blood sugar before and after surgery? Improving blood sugar levels before and after surgery helps healing and can limit problems. A way of improving blood sugar control is eating a healthy diet  by:  Eating less sugar and carbohydrates  Increasing activity/exercise  Talking with your doctor about reaching your blood sugar goals High blood sugars (greater than 180 mg/dL) can raise your risk of infections and slow your recovery, so you will need to focus on controlling your diabetes during the weeks before surgery. Make sure that the doctor who takes care of your diabetes knows about your planned surgery including the date and location.  How do I manage my blood sugar before surgery? Check your blood sugar at least 4 times a day, starting 2 days before surgery, to make sure that the level is not too high or low.  Check your blood sugar the morning of your surgery when you wake up and every 2 hours until you get to the Short Stay unit.  If your blood sugar is less than 70 mg/dL, you will need to treat for low blood sugar: Do not take insulin. Treat a low blood sugar (less than 70 mg/dL) with  cup of clear juice (cranberry or apple), 4 glucose tablets, OR glucose gel. Recheck blood sugar in 15 minutes after treatment (to make sure it is greater than 70 mg/dL). If your blood sugar is not greater than 70 mg/dL on recheck, call [phone] for further instructions. Report your blood sugar to the short stay nurse when you get to Short Stay.  If you are admitted to the hospital after surgery: Your blood sugar will be checked by the staff and you will probably be given insulin after surgery (instead of oral diabetes medicines) to make sure you have good blood sugar  levels. The goal for blood sugar control after surgery is 80-180 mg/dL.                      Do NOT Smoke (Tobacco/Vaping) for 24 hours prior to your procedure.  If you use a CPAP at night, you may bring your mask/headgear for your overnight stay.   You will be asked to remove any contacts, glasses, piercing's, hearing aid's, dentures/partials prior to surgery. Please bring cases for these items if needed.    Patients  discharged the day of surgery will not be allowed to drive home, and someone needs to stay with them for 24 hours.  SURGICAL WAITING ROOM VISITATION Patients may have no more than 2 support people in the waiting area - these visitors may rotate.   Pre-op nurse will coordinate an appropriate time for 1 ADULT support person, who may not rotate, to accompany patient in pre-op.  Children under the age of 35 must have an adult with them who is not the patient and must remain in the main waiting area with an adult.  If the patient needs to stay at the hospital during part of their recovery, the visitor guidelines for inpatient rooms apply.  Please refer to the [facility] website for the visitor guidelines for any additional information.   If you received a COVID test during your pre-op visit  it is requested that you wear a mask when out in public, stay away from anyone that may not be feeling well and notify your surgeon if you develop symptoms. If you have been in contact with anyone that has tested positive in the last 10 days please notify you surgeon.      Pre-operative 5 CHG Bathing Instructions   You can play a key role in reducing the risk of infection after surgery. Your skin needs to be as free of germs as possible. You can reduce the number of germs on your skin by washing with CHG (chlorhexidine gluconate) soap before surgery. CHG is an antiseptic soap that kills germs and continues to kill germs even after washing.   DO NOT use if you have an allergy to chlorhexidine/CHG or antibacterial soaps. If your skin becomes reddened or irritated, stop using the CHG and notify one of our RNs at [phone].   Please shower with the CHG soap starting 4 days before surgery using the following schedule:     Please keep in mind the following:  DO NOT shave, including legs and underarms, starting the day of your first shower.   You may shave your face at any point before/day of surgery.   Place clean sheets on your bed the day you start using CHG soap. Use a clean washcloth (not used since being washed) for each shower. DO NOT sleep with pets once you start using the CHG.   CHG Shower Instructions:  If you choose to wash your hair and private area, wash first with your normal shampoo/soap.  After you use shampoo/soap, rinse your hair and body thoroughly to remove shampoo/soap residue.  Turn the water OFF and apply about 3 tablespoons (45 ml) of CHG soap to a CLEAN washcloth.  Apply CHG soap ONLY FROM YOUR NECK DOWN TO YOUR TOES (washing for 3-5 minutes)  DO NOT use CHG soap on face, private areas, open wounds, or sores.  Pay special attention to the area where your surgery is being performed.  If you are having back surgery, having someone wash your  back for you may be helpful. Wait 2 minutes after CHG soap is applied, then you may rinse off the CHG soap.  Pat dry with a clean towel  Put on clean clothes/pajamas   If you choose to wear lotion, please use ONLY the CHG-compatible lotions on the back of this paper.   Additional instructions for the day of surgery: DO NOT APPLY any lotions, deodorants, cologne, or perfumes.   Do not bring valuables to the hospital. [facility] is not responsible for any belongings/valuables. Do not wear nail polish, gel polish, artificial nails, or any other type of covering on natural nails (fingers and toes) Do not wear jewelry or makeup Put on clean/comfortable clothes.  Please brush your teeth.  Ask your nurse before applying any prescription medications to the skin.     CHG Compatible Lotions   Aveeno Moisturizing lotion  Cetaphil Moisturizing Cream  Cetaphil Moisturizing Lotion  Clairol Herbal Essence Moisturizing Lotion, Dry Skin  Clairol Herbal Essence Moisturizing Lotion, Extra Dry Skin  Clairol Herbal Essence Moisturizing Lotion, Normal Skin  Curel Age Defying Therapeutic Moisturizing Lotion with Alpha Hydroxy  Curel  Extreme Care Body Lotion  Curel Soothing Hands Moisturizing Hand Lotion  Curel Therapeutic Moisturizing Cream, Fragrance-Free  Curel Therapeutic Moisturizing Lotion, Fragrance-Free  Curel Therapeutic Moisturizing Lotion, Original Formula  Eucerin Daily Replenishing Lotion  Eucerin Dry Skin Therapy Plus Alpha Hydroxy Crme  Eucerin Dry Skin Therapy Plus Alpha Hydroxy Lotion  Eucerin Original Crme  Eucerin Original Lotion  Eucerin Plus Crme Eucerin Plus Lotion  Eucerin TriLipid Replenishing Lotion  Keri Anti-Bacterial Hand Lotion  Keri Deep Conditioning Original Lotion Dry Skin Formula Softly Scented  Keri Deep Conditioning Original Lotion, Fragrance Free Sensitive Skin Formula  Keri Lotion Fast Absorbing Fragrance Free Sensitive Skin Formula  Keri Lotion Fast Absorbing Softly Scented Dry Skin Formula  Keri Original Lotion  Keri Skin Renewal Lotion Keri Silky Smooth Lotion  Keri Silky Smooth Sensitive Skin Lotion  Nivea Body Creamy Conditioning Oil  Nivea Body Extra Enriched Lotion  Nivea Body Original Lotion  Nivea Body Sheer Moisturizing Lotion Nivea Crme  Nivea Skin Firming Lotion  NutraDerm 30 Skin Lotion  NutraDerm Skin Lotion  NutraDerm Therapeutic Skin Cream  NutraDerm Therapeutic Skin Lotion  ProShield Protective Hand Cream  Provon moisturizing lotion  Please read over the following fact sheets that you were given.

## 2023-02-16 ENCOUNTER — Encounter (HOSPITAL_COMMUNITY)
Admission: RE | Admit: 2023-02-16 | Discharge: 2023-02-16 | Disposition: A | Discharge status: Home / Self Care | Payer: BC Managed Care – PPO | Source: Ambulatory Visit | Attending: Orthopedic Surgery | Admitting: Orthopedic Surgery

## 2023-02-16 ENCOUNTER — Encounter (HOSPITAL_COMMUNITY): Payer: Self-pay

## 2023-02-16 ENCOUNTER — Other Ambulatory Visit: Payer: Self-pay

## 2023-02-16 VITALS — BP 153/77 | HR 77 | Temp 97.7°F | Resp 18 | Ht 63.0 in | Wt 190.7 lb

## 2023-02-16 DIAGNOSIS — M5126 Other intervertebral disc displacement, lumbar region: Secondary | ICD-10-CM | POA: Insufficient documentation

## 2023-02-16 DIAGNOSIS — Z9882 Breast implant status: Secondary | ICD-10-CM | POA: Insufficient documentation

## 2023-02-16 DIAGNOSIS — Z6833 Body mass index (BMI) 33.0-33.9, adult: Secondary | ICD-10-CM | POA: Insufficient documentation

## 2023-02-16 DIAGNOSIS — I119 Hypertensive heart disease without heart failure: Secondary | ICD-10-CM | POA: Diagnosis not present

## 2023-02-16 DIAGNOSIS — E119 Type 2 diabetes mellitus without complications: Secondary | ICD-10-CM | POA: Diagnosis not present

## 2023-02-16 DIAGNOSIS — E785 Hyperlipidemia, unspecified: Secondary | ICD-10-CM | POA: Insufficient documentation

## 2023-02-16 DIAGNOSIS — E669 Obesity, unspecified: Secondary | ICD-10-CM | POA: Insufficient documentation

## 2023-02-16 DIAGNOSIS — M48061 Spinal stenosis, lumbar region without neurogenic claudication: Secondary | ICD-10-CM | POA: Insufficient documentation

## 2023-02-16 DIAGNOSIS — I341 Nonrheumatic mitral (valve) prolapse: Secondary | ICD-10-CM | POA: Diagnosis not present

## 2023-02-16 DIAGNOSIS — Z853 Personal history of malignant neoplasm of breast: Secondary | ICD-10-CM | POA: Diagnosis not present

## 2023-02-16 DIAGNOSIS — Z01818 Encounter for other preprocedural examination: Secondary | ICD-10-CM | POA: Insufficient documentation

## 2023-02-16 DIAGNOSIS — J45909 Unspecified asthma, uncomplicated: Secondary | ICD-10-CM | POA: Insufficient documentation

## 2023-02-16 DIAGNOSIS — I2510 Atherosclerotic heart disease of native coronary artery without angina pectoris: Secondary | ICD-10-CM

## 2023-02-16 HISTORY — DX: Cardiac arrhythmia, unspecified: I49.9

## 2023-02-16 HISTORY — DX: Unspecified osteoarthritis, unspecified site: M19.90

## 2023-02-16 LAB — BASIC METABOLIC PANEL
Anion gap: 13 (ref 5–15)
BUN: 20 mg/dL (ref 8–23)
CO2: 27 mmol/L (ref 22–32)
Calcium: 9.8 mg/dL (ref 8.9–10.3)
Chloride: 100 mmol/L (ref 98–111)
Creatinine, Ser: 0.93 mg/dL (ref 0.44–1.00)
GFR, Estimated: >60 mL/min (ref >60)
Glucose, Bld: 140 mg/dL — ABNORMAL HIGH (ref 70–99)
Potassium: 3.4 mmol/L — ABNORMAL LOW (ref 3.5–5.1)
Sodium: 140 mmol/L (ref 135–145)

## 2023-02-16 LAB — CBC
HCT: 36.8 % (ref 36.0–46.0)
Hemoglobin: 11.6 g/dL — ABNORMAL LOW (ref 12.0–15.0)
MCH: 26.8 pg (ref 26.0–34.0)
MCHC: 31.5 g/dL (ref 30.0–36.0)
MCV: 85 fL (ref 80.0–100.0)
Platelets: 233 10*3/uL (ref 150–400)
RBC: 4.33 MIL/uL (ref 3.87–5.11)
RDW: 13.6 % (ref 11.5–15.5)
WBC: 5.8 10*3/uL (ref 4.0–10.5)
nRBC: 0 % (ref 0.0–0.2)

## 2023-02-16 LAB — SURGICAL PCR SCREEN
MRSA, PCR: NEGATIVE
Staphylococcus aureus: NEGATIVE

## 2023-02-16 LAB — GLUCOSE, CAPILLARY: Glucose-Capillary: 150 mg/dL — ABNORMAL HIGH (ref 70–99)

## 2023-02-16 LAB — HEMOGLOBIN A1C
Hgb A1c MFr Bld: 6.5 % — ABNORMAL HIGH (ref 4.8–5.6)
Mean Plasma Glucose: 139.85 mg/dL

## 2023-02-16 NOTE — Progress Notes (Addendum)
PCP - Dr. Assunta Found Cardiologist - Dr. Thurmon Fair - Last office visit 11/06/2021  PPM/ICD - Denies Device Orders - n/a Rep Notified - n/a  Chest x-ray - Denies EKG - 02/16/2023 Stress Test - 02/28/2009 ECHO - 04/06/2007 Cardiac Cath - 2005 - Pt not aware of where procedure was complete. Comment in chart says "No CAD"  Sleep Study - Denies CPAP - n/a  Pt is DM2. She checks her blood sugar daily. Normal fasting blood sugar 110s. CBG at pre-op 150. A1c result pending.   Last dose of GLP1 agonist- n/a GLP1 instructions: n/a  Blood Thinner Instructions: n/a Aspirin Instructions: Pt instructed to hold ASA one week prior to surgery. Last dose will be August 21st.  ERAS Protcol - Clear liquids until 1100 morning of surgery PRE-SURGERY Ensure or G2- n/a  COVID TEST- n/a   Anesthesia review: Yes. Regular follow-up with Cardiology for HTN, Palpitations and strong family hx. DM2.   Patient denies shortness of breath, fever, cough and chest pain at PAT appointment. Pt denies any respiratory illness/infection in the last two months.   All instructions explained to the patient, with a verbal understanding of the material. Patient agrees to go over the instructions while at home for a better understanding. Patient also instructed to self quarantine after being tested for COVID-19. The opportunity to ask questions was provided.

## 2023-02-17 NOTE — Progress Notes (Signed)
Anesthesia Chart Review:  Case: [id number] Date/Time: 02/26/23 1345   Procedure: L2-3 decompression, discectomy - 3 C-Bed   Anesthesia type: General   Pre-op diagnosis: L2-3 herniated disc with stenosis   [facility] 04 / [facility]   Surgeons: Venita Lick, MD       DISCUSSION: Patient is a 62 year old female scheduled for the above procedure.  History includes never smoker, HTN, DM2, dyslipidemia, palpitations, left breast cancer (s/p left mastectomy 02/18/19, s/p placement of silicone implant, Latissimus dorsi flap to left chest, right breast reduction 04/16/20; s/p radiation, anti-estrogen therapy). BMI is consistent with obesity.  She is followed by cardiologist Dr. Royann Shivers for HTN and palpitations. She had a remote cath in 2005 showing normal coronaries. Echo in 2008 echo showed normal left ventricular ejection fraction, borderline left atrial enlargement, borderline criteria for MVP with mild MR. Nuclear stress 2010 was normal. Last seen by Dr. Royann Shivers on 11/06/21. Palpitations controlled at that time on diltiazem (previously had wheezing/asthma with carvedilol). Noted she was exercising on home stationary bike for 30 minutes, 3-4 times per week and was doing her own housework.  She denied chest pain, SOB, syncope, orthopnea, edema. HTN overall controlled. DM and HLD well controlled. No new changes made. At 02/16/23 PAT visit she denied any new CV symptoms and reported palpitations overall remain controlled with only rare occurrence about once a year.  EKG showed NSR. Her next routine cardiology follow-up visit is scheduled for 04/03/23.   She has surgical clearance from her PCP Dr. Phillips Odor dated 01/19/23 following evaluation. A1c 6.5% per 02/16/23 labs. She reported instructions to hold ASA 1 week prior to surgery.   Anesthesia team to evaluate on the day of surgery.   VS: BP (!) 153/77   Pulse 77   Temp 36.5 C   Resp 18   Ht 5\' 3"  (1.6 m)   Wt 86.5 kg   SpO2 99%   BMI 33.78  kg/m    PROVIDERS: Assunta Found, MD is PCP  Croitoru, Rachelle Hora, MD is cardiologist Serena Croissant, MD is HEM-ONC. Last visit 01/12/23 with one year follow-up planned. She told him he was planning left TKA.  Dorothy Puffer, MD is RAD-ONC Eula Listen, MD is GI   LABS: Labs reviewed: Acceptable for surgery. (all labs ordered are listed, but only abnormal results are displayed)  Labs Reviewed  GLUCOSE, CAPILLARY - Abnormal; Notable for the following components:      Result Value   Glucose-Capillary 150 (*)    All other components within normal limits  HEMOGLOBIN A1C - Abnormal; Notable for the following components:   Hgb A1c MFr Bld 6.5 (*)    All other components within normal limits  BASIC METABOLIC PANEL - Abnormal; Notable for the following components:   Potassium 3.4 (*)    Glucose, Bld 140 (*)    All other components within normal limits  CBC - Abnormal; Notable for the following components:   Hemoglobin 11.6 (*)    All other components within normal limits  SURGICAL PCR SCREEN    Colonoscopy 06/02/22 (surveillance due to history of multiple colonic adenomas): IMPRESSION: - Diverticulosis in the sigmoid colon. - The examination was otherwise normal on direct and retroflexion views. - No specimens collected.   EKG: 02/17/23: NSR   CV: Nuclear stress test 02/28/09: Normal Myocardial Perfusion Study. Post-stress EF 80%.   Echo 04/06/07: Summary: Normal left ventricular systolic function, EF > 55%. Transmitral spectral Doppler flow pattern is normal for age.  Right ventricular systolic function is normal. Borderline left atrial enlargement. The mitral valve is mildly thickened. There is borderline mild mitral valve prolapse. Prolapse of the anterior mitral leaflet. Mild mitral regurgitation.     Cardiac cath 07/24/03: IMPRESSIONS: 1. Normal left ventricular systolic function, ejection fraction 60%.  No significant valvular abnormality. 2. Mildly elevated left ventricular  end-diastolic pressure secondary to hypertension with hypertensive heart disease. 3. Normal coronary arteries. 4. No evidence of abdominal aortic aneurysm or ascending aortic aneurysm or ascending aortic dissection.   Cardiac event monitor 09/19/03 - 10/18/03: Predominant rhythm was SR with average HR 80 bpm.    Past Medical History:  Diagnosis Date   Arthritis    Asthma    Cancer (HCC) 11/2018   left breast cancer   Diabetes mellitus without complication (HCC)    Type 2   Dyslipidemia    Dysrhythmia    Palpitations   History of nuclear stress test 02/2009   exercise; normal pattern of perfusion; no significant ischemia demonstrated; low risk scan    Hypertension     Past Surgical History:  Procedure Laterality Date   BREAST RECONSTRUCTION  04/16/2020    Removal left chest tissue expander and placement silicone implant 2. Latissimus dorsi flap to left chest 3. Right breast reduction   BREAST RECONSTRUCTION WITH PLACEMENT OF TISSUE EXPANDER AND ALLODERM Left 02/18/2019   Procedure: LEFT BREAST RECONSTRUCTION WITH PLACEMENT OF TISSUE EXPANDER AND ALLODERM;  Surgeon: Glenna Fellows, MD;  Location: [facility];  Service: Plastics;  Laterality: Left;   BREAST REDUCTION SURGERY Right 04/16/2020   Procedure: RIGHT BREAST REDUCTION;  Surgeon: Glenna Fellows, MD;  Location: [facility];  Service: Plastics;  Laterality: Right;   CARDIAC CATHETERIZATION  07/2003   no evidence of CAD   COLONOSCOPY N/A 02/17/2017   Procedure: COLONOSCOPY;  Surgeon: Corbin Ade, MD;  [facility];  Service: Endoscopy;  Laterality: N/A;  10:00am   COLONOSCOPY WITH PROPOFOL N/A 06/02/2022   Procedure: COLONOSCOPY WITH PROPOFOL;  Surgeon: Corbin Ade, MD;  [facility];  Service: Endoscopy;  Laterality: N/A;  8:15am, asa 2   LATISSIMUS FLAP TO BREAST Left 04/16/2020   Procedure: LEFT LATISSIMUS DORSI FLAP TO LEFT CHEST;  Surgeon: Glenna Fellows, MD;  Location: [facility];   Service: Plastics;  Laterality: Left;   MASTECTOMY W/ SENTINEL NODE BIOPSY Left 02/18/2019   Procedure: LEFT MASTECTOMY WITH LEFT AXILLARY SENTINEL LYMPH NODE BIOPSY;  Surgeon: Ovidio Kin, MD;  Location: [facility];  Service: General;  Laterality: Left;   POLYPECTOMY  02/17/2017   Procedure: POLYPECTOMY;  Surgeon: Corbin Ade, MD;  [facility];  Service: Endoscopy;;  colon   removal cyst on chin     REMOVAL OF TISSUE EXPANDER AND PLACEMENT OF IMPLANT Left 04/16/2020   Procedure: REMOVAL OF LEFT CHEST TISSUE EXPANDER AND PLACEMENT OF IMPLANT;  Surgeon: Glenna Fellows, MD;  [facility];  Service: Plastics;  Laterality: Left;   TRANSTHORACIC ECHOCARDIOGRAM  03/2007   EF=>55%; borderline LA enlargement; MV mildly thickened/borderline MVP/mild MR    MEDICATIONS:  anastrozole (ARIMIDEX) 1 MG tablet   aspirin EC 81 MG tablet   CALCIUM-VITAMIN D PO   cetirizine (ZYRTEC) 10 MG tablet   Cyanocobalamin (VITAMIN B-12) 2500 MCG SUBL   cyclobenzaprine (FLEXERIL) 5 MG tablet   diclofenac (VOLTAREN) 75 MG EC tablet   diltiazem (TIAZAC) 360 MG 24 hr capsule   fish oil-omega-3 fatty acids 1000 MG capsule  gabapentin (NEURONTIN) 100 MG capsule   losartan (COZAAR) 100 MG tablet   meloxicam (MOBIC) 7.5 MG tablet   Menthol, Topical Analgesic, (BIOFREEZE EX)   montelukast (SINGULAIR) 10 MG tablet   Multiple Vitamin (MULTIVITAMIN WITH MINERALS) TABS tablet   pravastatin (PRAVACHOL) 40 MG tablet   PROAIR HFA 108 (90 Base) MCG/ACT inhaler   sitaGLIPtin-metformin (JANUMET) 50-500 MG tablet   triamterene-hydrochlorothiazide (MAXZIDE-25) 37.5-25 MG tablet   vitamin E 180 MG (400 UNITS) capsule   No current facility-administered medications for this encounter.     Shonna Chock, PA-C Surgical Short Stay/Anesthesiology [facility] Phone [phone] [facility] Phone [phone] 02/17/2023 5:00 PM

## 2023-02-17 NOTE — Anesthesia Preprocedure Evaluation (Addendum)
Anesthesia Evaluation  Patient identified by MRN, date of birth, ID band Patient awake    Reviewed: Allergy & Precautions, H&P , NPO status , Patient's Chart, lab work & pertinent test results  History of Anesthesia Complications Negative for: history of anesthetic complications  Airway Mallampati: I  TM Distance: >3 FB Neck ROM: Full    Dental  (+) Dental Advisory Given, Missing   Pulmonary asthma , neg COPD,  COPD inhaler   Pulmonary exam normal breath sounds clear to auscultation       Cardiovascular hypertension, Pt. on medications Normal cardiovascular exam     Neuro/Psych negative neurological ROS  negative psych ROS   GI/Hepatic negative GI ROS, Neg liver ROS,,,  Endo/Other  diabetes, Well Controlled, Type 2, Oral Hypoglycemic Agents    Renal/GU negative Renal ROS  negative genitourinary   Musculoskeletal  (+) Arthritis , Osteoarthritis,    Abdominal  (+) + obese  Peds negative pediatric ROS (+)  Hematology negative hematology ROS (+)   Anesthesia Other Findings   Reproductive/Obstetrics negative OB ROS                              Anesthesia Physical Anesthesia Plan  ASA: 2  Anesthesia Plan: General   Post-op Pain Management: Minimal or no pain anticipated   Induction: Intravenous  PONV Risk Score and Plan: 1 and 4 or greater and Ondansetron, Treatment may vary due to age or medical condition and Midazolam  Airway Management Planned: Oral ETT  Additional Equipment: None  Intra-op Plan:   Post-operative Plan:   Informed Consent: I have reviewed the patients History and Physical, chart, labs and discussed the procedure including the risks, benefits and alternatives for the proposed anesthesia with the patient or authorized representative who has indicated his/her understanding and acceptance.     Dental advisory given  Plan Discussed with: CRNA  Anesthesia  Plan Comments: (PAT note written 02/17/2023 by Shonna Chock, PA-C.  )        Anesthesia Quick Evaluation

## 2023-02-18 DIAGNOSIS — Z923 Personal history of irradiation: Secondary | ICD-10-CM | POA: Diagnosis not present

## 2023-02-18 DIAGNOSIS — Z9012 Acquired absence of left breast and nipple: Secondary | ICD-10-CM | POA: Diagnosis not present

## 2023-02-18 DIAGNOSIS — Z853 Personal history of malignant neoplasm of breast: Secondary | ICD-10-CM | POA: Diagnosis not present

## 2023-02-19 DIAGNOSIS — M21372 Foot drop, left foot: Secondary | ICD-10-CM | POA: Diagnosis not present

## 2023-02-25 NOTE — Progress Notes (Signed)
Patient was called to be informed that the surgery time for tomorrow was changed to 11:00 o'clock. Patient was instructed to be at the hospital at 09:00 o'clock and stop clear liquids at 08:00 o'clock. Patient verbalized understanding.

## 2023-02-26 ENCOUNTER — Ambulatory Visit (HOSPITAL_COMMUNITY): Payer: BC Managed Care – PPO

## 2023-02-26 ENCOUNTER — Other Ambulatory Visit: Payer: Self-pay

## 2023-02-26 ENCOUNTER — Ambulatory Visit (HOSPITAL_COMMUNITY): Payer: BC Managed Care – PPO | Admitting: Certified Registered Nurse Anesthetist

## 2023-02-26 ENCOUNTER — Encounter (HOSPITAL_COMMUNITY)
Admission: RE | Disposition: A | Discharge status: Home / Self Care | Payer: Self-pay | Source: Home / Self Care | Attending: Orthopedic Surgery

## 2023-02-26 ENCOUNTER — Ambulatory Visit (HOSPITAL_COMMUNITY): Payer: BC Managed Care – PPO | Admitting: Vascular Surgery

## 2023-02-26 ENCOUNTER — Encounter (HOSPITAL_COMMUNITY): Payer: Self-pay | Admitting: Orthopedic Surgery

## 2023-02-26 ENCOUNTER — Observation Stay (HOSPITAL_COMMUNITY)
Admission: RE | Admit: 2023-02-26 | Discharge: 2023-02-27 | Disposition: A | Discharge status: Home / Self Care | Payer: BC Managed Care – PPO | Attending: Orthopedic Surgery | Admitting: Orthopedic Surgery

## 2023-02-26 DIAGNOSIS — Z7982 Long term (current) use of aspirin: Secondary | ICD-10-CM | POA: Insufficient documentation

## 2023-02-26 DIAGNOSIS — J45909 Unspecified asthma, uncomplicated: Secondary | ICD-10-CM | POA: Insufficient documentation

## 2023-02-26 DIAGNOSIS — M5186 Other intervertebral disc disorders, lumbar region: Secondary | ICD-10-CM | POA: Diagnosis not present

## 2023-02-26 DIAGNOSIS — I10 Essential (primary) hypertension: Secondary | ICD-10-CM | POA: Insufficient documentation

## 2023-02-26 DIAGNOSIS — M48061 Spinal stenosis, lumbar region without neurogenic claudication: Secondary | ICD-10-CM | POA: Insufficient documentation

## 2023-02-26 DIAGNOSIS — M5126 Other intervertebral disc displacement, lumbar region: Secondary | ICD-10-CM | POA: Diagnosis not present

## 2023-02-26 DIAGNOSIS — Z853 Personal history of malignant neoplasm of breast: Secondary | ICD-10-CM | POA: Insufficient documentation

## 2023-02-26 DIAGNOSIS — Z9889 Other specified postprocedural states: Principal | ICD-10-CM

## 2023-02-26 DIAGNOSIS — M48062 Spinal stenosis, lumbar region with neurogenic claudication: Secondary | ICD-10-CM | POA: Diagnosis not present

## 2023-02-26 DIAGNOSIS — Z79899 Other long term (current) drug therapy: Secondary | ICD-10-CM | POA: Diagnosis not present

## 2023-02-26 DIAGNOSIS — Z0189 Encounter for other specified special examinations: Secondary | ICD-10-CM | POA: Diagnosis not present

## 2023-02-26 DIAGNOSIS — E119 Type 2 diabetes mellitus without complications: Secondary | ICD-10-CM | POA: Diagnosis not present

## 2023-02-26 HISTORY — PX: LUMBAR LAMINECTOMY/DECOMPRESSION MICRODISCECTOMY: SHX5026

## 2023-02-26 LAB — GLUCOSE, CAPILLARY
Glucose-Capillary: 112 mg/dL — ABNORMAL HIGH (ref 70–99)
Glucose-Capillary: 150 mg/dL — ABNORMAL HIGH (ref 70–99)
Glucose-Capillary: 190 mg/dL — ABNORMAL HIGH (ref 70–99)
Glucose-Capillary: 184 mg/dL — ABNORMAL HIGH (ref 70–99)

## 2023-02-26 SURGERY — LUMBAR LAMINECTOMY/DECOMPRESSION MICRODISCECTOMY 1 LEVEL
Anesthesia: General | Site: Spine Lumbar

## 2023-02-26 MED ORDER — LIDOCAINE 2% (20 MG/ML) 5 ML SYRINGE
INTRAMUSCULAR | Status: AC
  Filled 2023-02-26: qty 5

## 2023-02-26 MED ORDER — INSULIN ASPART 100 UNIT/ML IJ SOLN: 0.0000 [IU] | INTRAMUSCULAR | Status: DC | PRN

## 2023-02-26 MED ORDER — 0.9 % SODIUM CHLORIDE (POUR BTL) OPTIME
TOPICAL | Status: DC | PRN
  Administered 2023-02-26 (×2): 1000 mL

## 2023-02-26 MED ORDER — ROCURONIUM BROMIDE 10 MG/ML (PF) SYRINGE
PREFILLED_SYRINGE | INTRAVENOUS | Status: DC | PRN
  Administered 2023-02-26: 90 mg via INTRAVENOUS
  Administered 2023-02-26 (×3): 10 mg via INTRAVENOUS

## 2023-02-26 MED ORDER — PHENYLEPHRINE 80 MCG/ML (10ML) SYRINGE FOR IV PUSH (FOR BLOOD PRESSURE SUPPORT)
PREFILLED_SYRINGE | INTRAVENOUS | Status: AC
  Filled 2023-02-26: qty 10

## 2023-02-26 MED ORDER — DEXAMETHASONE SODIUM PHOSPHATE 10 MG/ML IJ SOLN
INTRAMUSCULAR | Status: AC
  Filled 2023-02-26: qty 1

## 2023-02-26 MED ORDER — METHOCARBAMOL 500 MG PO TABS
500.0000 mg | ORAL_TABLET | Freq: Four times a day (QID) | ORAL | Status: DC | PRN
  Administered 2023-02-26 – 2023-02-27 (×3): 500 mg via ORAL
  Filled 2023-02-26 (×3): qty 1

## 2023-02-26 MED ORDER — ACETAMINOPHEN 650 MG RE SUPP: 650.0000 mg | RECTAL | Status: DC | PRN

## 2023-02-26 MED ORDER — PROPOFOL 10 MG/ML IV BOLUS
INTRAVENOUS | Status: DC | PRN
  Administered 2023-02-26: 160 mg via INTRAVENOUS

## 2023-02-26 MED ORDER — ROCURONIUM BROMIDE 10 MG/ML (PF) SYRINGE
PREFILLED_SYRINGE | INTRAVENOUS | Status: AC
  Filled 2023-02-26: qty 10

## 2023-02-26 MED ORDER — INSULIN ASPART 100 UNIT/ML IJ SOLN
0.0000 [IU] | Freq: Three times a day (TID) | INTRAMUSCULAR | Status: DC
  Administered 2023-02-27: 2 [IU] via SUBCUTANEOUS

## 2023-02-26 MED ORDER — MIDAZOLAM HCL 2 MG/2ML IJ SOLN
INTRAMUSCULAR | Status: DC | PRN
  Administered 2023-02-26: 2 mg via INTRAVENOUS

## 2023-02-26 MED ORDER — PHENOL 1.4 % MT LIQD: 1.0000 | OROMUCOSAL | Status: DC | PRN

## 2023-02-26 MED ORDER — ONDANSETRON HCL 4 MG/2ML IJ SOLN
INTRAMUSCULAR | Status: AC
  Filled 2023-02-26: qty 2

## 2023-02-26 MED ORDER — CEFAZOLIN SODIUM-DEXTROSE 1-4 GM/50ML-% IV SOLN
1.0000 g | Freq: Three times a day (TID) | INTRAVENOUS | Status: AC
  Administered 2023-02-26 – 2023-02-27 (×2): 1 g via INTRAVENOUS
  Filled 2023-02-26 (×2): qty 50

## 2023-02-26 MED ORDER — LACTATED RINGERS IV SOLN: INTRAVENOUS | Status: DC

## 2023-02-26 MED ORDER — THROMBIN 20000 UNITS EX SOLR
CUTANEOUS | Status: AC
  Filled 2023-02-26: qty 20000

## 2023-02-26 MED ORDER — LOSARTAN POTASSIUM 50 MG PO TABS
100.0000 mg | ORAL_TABLET | Freq: Every morning | ORAL | Status: DC
  Filled 2023-02-26 (×2): qty 2

## 2023-02-26 MED ORDER — METFORMIN HCL 500 MG PO TABS
500.0000 mg | ORAL_TABLET | Freq: Two times a day (BID) | ORAL | Status: DC
  Filled 2023-02-26: qty 1

## 2023-02-26 MED ORDER — MAGNESIUM CITRATE PO SOLN
1.0000 | Freq: Once | ORAL | Status: AC | PRN
  Administered 2023-02-27: 1 via ORAL
  Filled 2023-02-26: qty 296

## 2023-02-26 MED ORDER — CEFAZOLIN SODIUM-DEXTROSE 2-4 GM/100ML-% IV SOLN
2.0000 g | INTRAVENOUS | Status: AC
  Administered 2023-02-26: 2 g via INTRAVENOUS
  Filled 2023-02-26: qty 100

## 2023-02-26 MED ORDER — EPHEDRINE SULFATE-NACL 50-0.9 MG/10ML-% IV SOSY
PREFILLED_SYRINGE | INTRAVENOUS | Status: DC | PRN
Start: 2023-02-26 — End: 2023-02-26
  Administered 2023-02-26 (×3): 5 mg via INTRAVENOUS

## 2023-02-26 MED ORDER — BUPIVACAINE-EPINEPHRINE (PF) 0.25% -1:200000 IJ SOLN
INTRAMUSCULAR | Status: AC
  Filled 2023-02-26: qty 30

## 2023-02-26 MED ORDER — SUGAMMADEX SODIUM 200 MG/2ML IV SOLN
INTRAVENOUS | Status: DC | PRN
  Administered 2023-02-26: 200 mg via INTRAVENOUS

## 2023-02-26 MED ORDER — EPHEDRINE 5 MG/ML INJ
INTRAVENOUS | Status: AC
  Filled 2023-02-26: qty 5

## 2023-02-26 MED ORDER — MENTHOL 3 MG MT LOZG: 1.0000 | LOZENGE | OROMUCOSAL | Status: DC | PRN

## 2023-02-26 MED ORDER — OXYCODONE-ACETAMINOPHEN 10-325 MG PO TABS: 1.0000 | ORAL_TABLET | Freq: Four times a day (QID) | ORAL | 0 refills | Status: AC | PRN

## 2023-02-26 MED ORDER — ALBUTEROL SULFATE (2.5 MG/3ML) 0.083% IN NEBU: 2.5000 mg | INHALATION_SOLUTION | RESPIRATORY_TRACT | Status: DC | PRN

## 2023-02-26 MED ORDER — ONDANSETRON HCL 4 MG/2ML IJ SOLN
INTRAMUSCULAR | Status: DC | PRN
Start: 2023-02-26 — End: 2023-02-26
  Administered 2023-02-26: 4 mg via INTRAVENOUS

## 2023-02-26 MED ORDER — SODIUM CHLORIDE 0.9 % IV SOLN: 250.0000 mL | INTRAVENOUS | Status: DC

## 2023-02-26 MED ORDER — FENTANYL CITRATE (PF) 250 MCG/5ML IJ SOLN
INTRAMUSCULAR | Status: AC
  Filled 2023-02-26: qty 5

## 2023-02-26 MED ORDER — INSULIN ASPART 100 UNIT/ML IJ SOLN: 0.0000 [IU] | Freq: Every day | INTRAMUSCULAR | Status: DC

## 2023-02-26 MED ORDER — METFORMIN HCL 500 MG PO TABS: 500.0000 mg | ORAL_TABLET | Freq: Two times a day (BID) | ORAL | Status: DC

## 2023-02-26 MED ORDER — SODIUM CHLORIDE 0.9% FLUSH: 3.0000 mL | INTRAVENOUS | Status: DC | PRN

## 2023-02-26 MED ORDER — HYDROMORPHONE HCL 1 MG/ML IJ SOLN: 0.5000 mg | INTRAMUSCULAR | Status: DC | PRN

## 2023-02-26 MED ORDER — ACETAMINOPHEN 325 MG PO TABS
650.0000 mg | ORAL_TABLET | ORAL | Status: DC | PRN
  Administered 2023-02-26: 650 mg via ORAL
  Filled 2023-02-26 (×3): qty 2

## 2023-02-26 MED ORDER — ONDANSETRON HCL 4 MG PO TABS: 4.0000 mg | ORAL_TABLET | Freq: Three times a day (TID) | ORAL | 0 refills | Status: DC | PRN

## 2023-02-26 MED ORDER — THROMBIN 20000 UNITS EX SOLR
CUTANEOUS | Status: DC | PRN
  Administered 2023-02-26: 20 mL via TOPICAL

## 2023-02-26 MED ORDER — INSULIN ASPART 100 UNIT/ML IJ SOLN
0.0000 [IU] | Freq: Three times a day (TID) | INTRAMUSCULAR | Status: DC
  Administered 2023-02-26: 3 [IU] via SUBCUTANEOUS

## 2023-02-26 MED ORDER — SITAGLIPTIN PHOS-METFORMIN HCL 50-500 MG PO TABS: 1.0000 | ORAL_TABLET | Freq: Two times a day (BID) | ORAL | Status: DC

## 2023-02-26 MED ORDER — TRANEXAMIC ACID-NACL 1000-0.7 MG/100ML-% IV SOLN
INTRAVENOUS | Status: AC
  Filled 2023-02-26: qty 100

## 2023-02-26 MED ORDER — TRIAMTERENE-HCTZ 37.5-25 MG PO TABS
1.0000 | ORAL_TABLET | Freq: Every morning | ORAL | Status: DC
  Administered 2023-02-27: 1 via ORAL
  Filled 2023-02-26: qty 1

## 2023-02-26 MED ORDER — ONDANSETRON HCL 4 MG/2ML IJ SOLN: 4.0000 mg | Freq: Four times a day (QID) | INTRAMUSCULAR | Status: DC | PRN

## 2023-02-26 MED ORDER — ONDANSETRON HCL 4 MG PO TABS: 4.0000 mg | ORAL_TABLET | Freq: Four times a day (QID) | ORAL | Status: DC | PRN

## 2023-02-26 MED ORDER — PROPOFOL 10 MG/ML IV BOLUS
INTRAVENOUS | Status: AC
  Filled 2023-02-26: qty 20

## 2023-02-26 MED ORDER — LINAGLIPTIN 5 MG PO TABS
5.0000 mg | ORAL_TABLET | Freq: Every day | ORAL | Status: DC
  Filled 2023-02-26: qty 1

## 2023-02-26 MED ORDER — PROMETHAZINE HCL 25 MG/ML IJ SOLN: 6.2500 mg | INTRAMUSCULAR | Status: DC | PRN

## 2023-02-26 MED ORDER — BUPIVACAINE-EPINEPHRINE 0.25% -1:200000 IJ SOLN
INTRAMUSCULAR | Status: DC | PRN
  Administered 2023-02-26: 20 mL

## 2023-02-26 MED ORDER — CHLORHEXIDINE GLUCONATE 0.12 % MT SOLN
15.0000 mL | Freq: Once | OROMUCOSAL | Status: AC
  Administered 2023-02-26: 15 mL via OROMUCOSAL
  Filled 2023-02-26: qty 15

## 2023-02-26 MED ORDER — POLYETHYLENE GLYCOL 3350 17 G PO PACK
17.0000 g | PACK | Freq: Every day | ORAL | Status: DC | PRN
  Administered 2023-02-26: 17 g via ORAL
  Filled 2023-02-26: qty 1

## 2023-02-26 MED ORDER — HYDROMORPHONE HCL 1 MG/ML IJ SOLN: 0.2500 mg | INTRAMUSCULAR | Status: DC | PRN

## 2023-02-26 MED ORDER — DEXAMETHASONE SODIUM PHOSPHATE 10 MG/ML IJ SOLN
INTRAMUSCULAR | Status: DC | PRN
  Administered 2023-02-26: 5 mg via INTRAVENOUS

## 2023-02-26 MED ORDER — OXYCODONE HCL 5 MG PO TABS
5.0000 mg | ORAL_TABLET | ORAL | Status: DC | PRN
  Administered 2023-02-26 – 2023-02-27 (×5): 5 mg via ORAL
  Filled 2023-02-26 (×3): qty 1

## 2023-02-26 MED ORDER — ANASTROZOLE 1 MG PO TABS
1.0000 mg | ORAL_TABLET | Freq: Every day | ORAL | Status: DC
  Administered 2023-02-27: 1 mg via ORAL
  Filled 2023-02-26: qty 1

## 2023-02-26 MED ORDER — OXYCODONE HCL 5 MG PO TABS
10.0000 mg | ORAL_TABLET | ORAL | Status: DC | PRN
  Filled 2023-02-26 (×2): qty 2

## 2023-02-26 MED ORDER — ORAL CARE MOUTH RINSE: 15.0000 mL | Freq: Once | OROMUCOSAL | Status: AC

## 2023-02-26 MED ORDER — DILTIAZEM HCL ER COATED BEADS 360 MG PO CP24
360.0000 mg | ORAL_CAPSULE | Freq: Every morning | ORAL | Status: DC
  Administered 2023-02-27: 360 mg via ORAL
  Filled 2023-02-26: qty 1

## 2023-02-26 MED ORDER — FENTANYL CITRATE (PF) 250 MCG/5ML IJ SOLN
INTRAMUSCULAR | Status: DC | PRN
  Administered 2023-02-26: 50 ug via INTRAVENOUS
  Administered 2023-02-26: 100 ug via INTRAVENOUS

## 2023-02-26 MED ORDER — METHOCARBAMOL 1000 MG/10ML IJ SOLN: 500.0000 mg | Freq: Four times a day (QID) | INTRAVENOUS | Status: DC | PRN

## 2023-02-26 MED ORDER — SUCCINYLCHOLINE CHLORIDE 200 MG/10ML IV SOSY
PREFILLED_SYRINGE | INTRAVENOUS | Status: AC
  Filled 2023-02-26: qty 10

## 2023-02-26 MED ORDER — LIDOCAINE 2% (20 MG/ML) 5 ML SYRINGE
INTRAMUSCULAR | Status: DC | PRN
  Administered 2023-02-26: 80 mg via INTRAVENOUS

## 2023-02-26 MED ORDER — SODIUM CHLORIDE 0.9% FLUSH
3.0000 mL | Freq: Two times a day (BID) | INTRAVENOUS | Status: DC
  Administered 2023-02-26: 3 mL via INTRAVENOUS

## 2023-02-26 MED ORDER — PHENYLEPHRINE HCL-NACL 20-0.9 MG/250ML-% IV SOLN
INTRAVENOUS | Status: DC | PRN
Start: 2023-02-26 — End: 2023-02-26
  Administered 2023-02-26: 25 ug/min via INTRAVENOUS

## 2023-02-26 MED ORDER — MEPERIDINE HCL 25 MG/ML IJ SOLN: 6.2500 mg | INTRAMUSCULAR | Status: DC | PRN

## 2023-02-26 MED ORDER — KETOROLAC TROMETHAMINE 30 MG/ML IJ SOLN: 30.0000 mg | Freq: Once | INTRAMUSCULAR | Status: DC | PRN

## 2023-02-26 MED ORDER — TRANEXAMIC ACID-NACL 1000-0.7 MG/100ML-% IV SOLN
INTRAVENOUS | Status: DC | PRN
  Administered 2023-02-26: 1000 mg via INTRAVENOUS

## 2023-02-26 MED ORDER — METHOCARBAMOL 500 MG PO TABS: 500.0000 mg | ORAL_TABLET | Freq: Three times a day (TID) | ORAL | 0 refills | Status: AC | PRN

## 2023-02-26 MED ORDER — PRAVASTATIN SODIUM 40 MG PO TABS
40.0000 mg | ORAL_TABLET | Freq: Every day | ORAL | Status: DC
  Administered 2023-02-26: 40 mg via ORAL
  Filled 2023-02-26 (×2): qty 1

## 2023-02-26 MED ORDER — SURGIFLO WITH THROMBIN (HEMOSTATIC MATRIX KIT) OPTIME
TOPICAL | Status: DC | PRN
  Administered 2023-02-26 (×3): 1 via TOPICAL

## 2023-02-26 MED ORDER — MIDAZOLAM HCL 2 MG/2ML IJ SOLN
INTRAMUSCULAR | Status: AC
  Filled 2023-02-26: qty 2

## 2023-02-26 SURGICAL SUPPLY — 63 items
AGENT HMST KT MTR STRL THRMB (HEMOSTASIS) ×2
APL SKNCLS STERI-STRIP NONHPOA (GAUZE/BANDAGES/DRESSINGS) ×1
BAG COUNTER SPONGE SURGICOUNT (BAG) ×2 IMPLANT
BAG SPNG CNTER NS LX DISP (BAG) ×1
BENZOIN TINCTURE PRP APPL 2/3 (GAUZE/BANDAGES/DRESSINGS) IMPLANT
BNDG GAUZE DERMACEA FLUFF 4 (GAUZE/BANDAGES/DRESSINGS) ×2 IMPLANT
BNDG GZE DERMACEA 4 6PLY (GAUZE/BANDAGES/DRESSINGS)
CANISTER SUCT 3000ML PPV (MISCELLANEOUS) ×2 IMPLANT
CLSR STERI-STRIP ANTIMIC 1/2X4 (GAUZE/BANDAGES/DRESSINGS) ×2 IMPLANT
CORD BIPOLAR FORCEPS 12FT (ELECTRODE) ×2 IMPLANT
COVER SURGICAL LIGHT HANDLE (MISCELLANEOUS) ×2 IMPLANT
DRAIN CHANNEL 15F RND FF W/TCR (WOUND CARE) IMPLANT
DRAIN RELI 100 BL SUC LF ST (DRAIN) ×1
DRAPE SURG 17X23 STRL (DRAPES) ×2 IMPLANT
DRAPE U-SHAPE 47X51 STRL (DRAPES) ×2 IMPLANT
DRSG OPSITE POSTOP 3X4 (GAUZE/BANDAGES/DRESSINGS) ×2 IMPLANT
DRSG OPSITE POSTOP 4X6 (GAUZE/BANDAGES/DRESSINGS) IMPLANT
DURAPREP 26ML APPLICATOR (WOUND CARE) ×2 IMPLANT
ELECT BLADE 4.0 EZ CLEAN MEGAD (MISCELLANEOUS)
ELECT CAUTERY BLADE 6.4 (BLADE) ×2 IMPLANT
ELECT PENCIL ROCKER SW 15FT (MISCELLANEOUS) ×2 IMPLANT
ELECT REM PT RETURN 9FT ADLT (ELECTROSURGICAL) ×1
ELECTRODE BLDE 4.0 EZ CLN MEGD (MISCELLANEOUS) IMPLANT
ELECTRODE REM PT RTRN 9FT ADLT (ELECTROSURGICAL) ×2 IMPLANT
EVACUATOR SILICONE 100CC (DRAIN) IMPLANT
GAUZE SPONGE 4X4 12PLY STRL (GAUZE/BANDAGES/DRESSINGS) IMPLANT
GLOVE BIO SURGEON STRL SZ 6.5 (GLOVE) ×2 IMPLANT
GLOVE BIOGEL PI IND STRL 6.5 (GLOVE) ×2 IMPLANT
GLOVE BIOGEL PI IND STRL 8.5 (GLOVE) ×2 IMPLANT
GLOVE SS BIOGEL STRL SZ 8.5 (GLOVE) ×2 IMPLANT
GOWN STRL REUS W/ TWL LRG LVL3 (GOWN DISPOSABLE) ×4 IMPLANT
GOWN STRL REUS W/TWL 2XL LVL3 (GOWN DISPOSABLE) ×2 IMPLANT
GOWN STRL REUS W/TWL LRG LVL3 (GOWN DISPOSABLE) ×2
KIT BASIN OR (CUSTOM PROCEDURE TRAY) ×2 IMPLANT
KIT TURNOVER KIT B (KITS) ×2 IMPLANT
NDL 22X1.5 STRL (OR ONLY) (MISCELLANEOUS) ×2 IMPLANT
NDL SPNL 18GX3.5 QUINCKE PK (NEEDLE) ×4 IMPLANT
NEEDLE 22X1.5 STRL (OR ONLY) (MISCELLANEOUS) ×1 IMPLANT
NEEDLE SPNL 18GX3.5 QUINCKE PK (NEEDLE) ×2 IMPLANT
NS IRRIG 1000ML POUR BTL (IV SOLUTION) ×2 IMPLANT
PACK LAMINECTOMY ORTHO (CUSTOM PROCEDURE TRAY) ×2 IMPLANT
PACK UNIVERSAL I (CUSTOM PROCEDURE TRAY) ×2 IMPLANT
PAD ARMBOARD 7.5X6 YLW CONV (MISCELLANEOUS) ×4 IMPLANT
PATTIES SURGICAL .5 X.5 (GAUZE/BANDAGES/DRESSINGS) ×2 IMPLANT
PATTIES SURGICAL .5 X1 (DISPOSABLE) ×2 IMPLANT
SPONGE SURGIFOAM ABS GEL 100 (HEMOSTASIS) IMPLANT
SPONGE T-LAP 4X18 ~~LOC~~+RFID (SPONGE) ×6 IMPLANT
SURGIFLO W/THROMBIN 8M KIT (HEMOSTASIS) IMPLANT
SUT BONE WAX W31G (SUTURE) ×2 IMPLANT
SUT ETHILON 2 0 PSLX (SUTURE) IMPLANT
SUT MNCRL+ AB 3-0 CT1 36 (SUTURE) ×2 IMPLANT
SUT STRATAFIX 1PDS 45CM VIOLET (SUTURE) IMPLANT
SUT VIC AB 0 CT1 27 (SUTURE) ×1
SUT VIC AB 0 CT1 27XBRD ANBCTR (SUTURE) IMPLANT
SUT VIC AB 1 CT1 18XCR BRD 8 (SUTURE) ×2 IMPLANT
SUT VIC AB 1 CT1 8-18 (SUTURE) ×1
SUT VIC AB 2-0 CT1 18 (SUTURE) ×2 IMPLANT
SYR BULB IRRIG 60ML STRL (SYRINGE) ×2 IMPLANT
SYR CONTROL 10ML LL (SYRINGE) ×2 IMPLANT
TOWEL GREEN STERILE (TOWEL DISPOSABLE) ×2 IMPLANT
TOWEL GREEN STERILE FF (TOWEL DISPOSABLE) ×2 IMPLANT
WATER STERILE IRR 1000ML POUR (IV SOLUTION) ×2 IMPLANT
YANKAUER SUCT BULB TIP NO VENT (SUCTIONS) IMPLANT

## 2023-02-26 NOTE — Op Note (Signed)
OPERATIVE REPORT  DATE OF SURGERY: 02/26/2023  PATIENT NAME:  Tracy Knight MRN: [id number] DOB: 1960-09-22  PCP: Assunta Found, MD  PRE-OPERATIVE DIAGNOSIS: L2-3 disc herniation with severe stenosis   POST-OPERATIVE DIAGNOSIS: Same  PROCEDURE:   Lumbar decompression L2-3 with medial foraminotomies and bilateral L2 and L3 foraminotomies. Discectomy L2-3.  SURGEON:  Venita Lick, MD  PHYSICIAN ASSISTANT: None  ANESTHESIA:   General  EBL: 200 ml   Complications: None  BRIEF HISTORY: Tracy Knight is a 62 y.o. adult who presented to my office approximately 2 months ago with significant back buttock and neuropathic leg pain.  Patient's clinical exam was consistent with lumbar spinal stenosis with neurogenic claudication.  Imaging showed a central large disc herniation at L2-3 with cranial migration causing marked stenosis.  After discussing treatment options she elected to proceed with a decompression.  After discussing treatment options she elected to proceed with a decompression.  All appropriate risks benefits and alternatives were discussed with the patient and consent was obtained.  PROCEDURE DETAILS: Patient was brought into the operating room and was properly positioned on the operating room table.  After induction with generalgeneral anesthesia the patient was endotracheally intubated.  A timeout was taken to confirm all important data: including patient, procedure, and the level. Teds, SCD's were applied.   Patient was turned prone onto the Wilson frame and all bony prominences were well-padded.  The back was then prepped and draped in the standard fashion. 18-gauge needles were then placed and an x-ray was taken to localize the skin incision.  Once this was done I marked out my incision so that it spanned from superior aspect of the L3 pedicle to the superior aspect of the L2 pedicle.  The incision site was infiltrated with cortisone Marcaine with epinephrine and the midline  incision was made.  Sharp dissection was carried out down to the deep fascia.  Deep fascia was sharply incised and I stripped the paraspinal muscles using Bovie and Cobb elevators bilaterally to expose the L1-2 and L2-3 facet complexes as well as the entire L2 spinous process and lamina.  2 markers were placed into the wound and x-ray was taken.  This confirmed that I was at the appropriate level.  The majority of the L2 spinous process was removed with a double-action Leksell rongeur and then utilized by 2 and 3 mm Kerrison rongeur to perform laminotomy.  Once an adequate laminotomy was performed using a Penfield 4 to dissect through the central raphae of the ligamentum flavum.  I then gently began creating a plane between the thecal sac and the ligamentum flavum.  Once I had explained I used my Kerrison rongeurs to remove the ligamentum flavum to complete my central decompression I then continued to dissect gently with the Penfield 4 into the lateral recess noting that there was significant stenosis.  Once I created a plane I decompressed out laterally performing a medial facetectomy with a Kerrison rongeurs.  I could now see the medial aspect of the L3 pedicle.  There were very large epidural veins which I encountered and they were coagulated with bipolar electrocautery.  I then continue my decompression superiorly.  The thecal sac was still under significant compression due to the disc herniation.  Once I had the right side decompressed I then went to the contralateral side and began working into the left lateral recess.  Using the same technique of gentle manipulation and mobilization as able to create my plane mobilized the thecal sac.  The ligamentum flavum was resected and I continued out to perform a medial facetectomy.  Again the large epidural veins were encountered and coagulated.  I then proceeded inferiorly.  I created a plane underneath the leading edge of the L3 lamina and trim this down and then  went into the lateral recess followed into the foramen.  An L3 foraminotomy was then performed with angled Kerrison rongeurs.   At this point I was comfortable with the caudal aspect of my decompression and so I proceeded to work cranially.  I continued to perform my central decompressions until I felt as though I was above the disc herniation.  I then worked into the lateral recess.  I could now see the L2 nerve root and palpate the inferior aspect of the L2 pedicle.  L2 foraminotomy was then performed with the angled Kerrison rongeurs bilaterally.  At this point I had completely decompressed the thecal sac centrally as well as the L2 and L3 nerve root.  I began to see the lateral aspect of the central disc herniation.  I continue to gently mobilized the thecal sac is much as I could.  Annulotomy was then performed a 15 blade scalpel and using a nerve hook I was able to deliver small amounts of disc herniation material.  I then did the same technique on the contralateral side.  I was working with both the left and right sides in order to adequately decompress the thecal sac and remove as much of the disc herniation..  Given the central nature of its position I did have concerns about placing too much traction on the thecal sac in order to get to the disc fragment which would resulted in neural injury or causing a ventral durotomy and resulting CSF leak.  After cautiously manipulating and mobilized the thecal sac and sweeping to remove disc fragments I felt as though I had adequately decompressed the thecal sac.  The exiting nerve roots were free.  I could easily pass my nerve hook out the foramen at L2 and L3 bilaterally.  In addition I passed my nerve hook underneath the thecal sac from the level of the disc space superiorly.  I placed my instruments to span out my decompression and took an x-ray to confirm that the area of my decompression spanned the area of maximum compression is seen on the MRI.  At this  point I was quite pleased with the overall decompression.  Although there may have been some fragments of disc.  Serial left I had adequately decompressed the thecal sac to allow it to no longer be under compression.  The wound was copiously irrigated with normal saline and I made sure that hemostasis.  Thrombin soaked Gelfoam padding was placed over the laminotomy site and then I placed a drain which was brought out of a separate stab incision.  I closed the deep fascia with a running #1 strata fix.  I then the remainder of the wound in a layered fashion with a running 0 Vicryl suture level followed by interrupted 2-0 Vicryl sutures.  3-0 Monocryl was used to close the skin edges.  Steri-Strips and dry dressings were applied.  A drain stitch was then placed and a dressing was applied over this area.  The patient was ultimately extubated and transferred the PACU without incident.  The end of the case all needle sponge counts were correct.  There were no adverse intraoperative events.     the lateral recess I then  Venita Lick,  MD 02/26/2023 2:24 PM

## 2023-02-26 NOTE — Brief Op Note (Signed)
02/26/2023  2:39 PM  PATIENT:  Tracy Knight  62 y.o. adult  PRE-OPERATIVE DIAGNOSIS:  L2-3 herniated disc with stenosis  POST-OPERATIVE DIAGNOSIS:  L2-3 herniated disc with stenosis  PROCEDURE:  Procedure(s) with comments: LUMBAR TWO TO THREE DECOMPRESSION AND DISCECTOMY (N/A) - 3 C-Bed  SURGEON:  Surgeons and Role:    Venita Lick, MD - Primary  PHYSICIAN ASSISTANT:   ASSISTANTS: none   ANESTHESIA:   general  EBL:  200 mL   BLOOD ADMINISTERED:none  DRAINS:  JP drain    LOCAL MEDICATIONS USED:  MARCAINE     SPECIMEN:  No Specimen  DISPOSITION OF SPECIMEN:  N/A  COUNTS:  YES  TOURNIQUET:  * No tourniquets in log *  DICTATION: .Dragon Dictation  PLAN OF CARE: Admit for overnight observation  PATIENT DISPOSITION:  PACU - hemodynamically stable.

## 2023-02-26 NOTE — Transfer of Care (Signed)
Immediate Anesthesia Transfer of Care Note  Patient: Tracy Knight  Procedure(s) Performed: LUMBAR TWO TO THREE DECOMPRESSION AND DISCECTOMY (Spine Lumbar)  Patient Location: PACU  Anesthesia Type:General  Level of Consciousness: awake, alert , patient cooperative, and responds to stimulation  Airway & Oxygen Therapy: Patient Spontanous Breathing and Patient connected to face mask oxygen  Post-op Assessment: Report given to RN, Post -op Vital signs reviewed and stable, and Patient moving all extremities X 4  Post vital signs: Reviewed and stable  Last Vitals:  Vitals Value Taken Time  BP    Temp    Pulse 81 02/26/23 1444  Resp 18 02/26/23 1444  SpO2 100 % 02/26/23 1444  Vitals shown include unfiled device data.  Last Pain:  Vitals:   02/26/23 0919  PainSc: 0-No pain      Patients Stated Pain Goal: 0 (02/26/23 0919)  Complications: No notable events documented.

## 2023-02-26 NOTE — Anesthesia Procedure Notes (Signed)
Procedure Name: Intubation Date/Time: 02/26/2023 10:59 AM  Performed by: Shary Decamp, CRNAPre-anesthesia Checklist: Patient identified, Patient being monitored, Timeout performed, Emergency Drugs available and Suction available Patient Re-evaluated:Patient Re-evaluated prior to induction Oxygen Delivery Method: Circle System Utilized Preoxygenation: Pre-oxygenation with 100% oxygen Induction Type: IV induction Ventilation: Mask ventilation without difficulty Laryngoscope Size: Miller and 2 Grade View: Grade I Tube type: Oral Tube size: 7.0 mm Number of attempts: 1 Airway Equipment and Method: Stylet Placement Confirmation: ETT inserted through vocal cords under direct vision, positive ETCO2 and breath sounds checked- equal and bilateral Secured at: 21 cm Tube secured with: Tape Dental Injury: Teeth and Oropharynx as per pre-operative assessment

## 2023-02-26 NOTE — OR Nursing (Signed)
Radiologist Dr. Grace Isaac called to confirm X-ray was taken at the Lumbar Two-Three level.

## 2023-02-26 NOTE — Discharge Instructions (Signed)

## 2023-02-26 NOTE — H&P (Signed)
History: Tracy Knight is a very pleasant 62 year old woman with progressive back buttock and bilateral lower extremity pain.  Patient's clinical exam and imaging studies are consistent with lumbar spinal stenosis secondary to an L2-3 disc herniation.  Attempts at conservative management had failed to alleviate her pain and improve her quality of life.  As a result she is elected to postpone her total knee replacement in order to address the severe back buttock and radicular leg pain.  We have gone over the surgical procedure including the risks and benefits and she is has expressed understanding and willingness to move forward with surgery.  Past Medical History:  Diagnosis Date   Arthritis    Asthma    Cancer (HCC) 11/2018   left breast cancer   Diabetes mellitus without complication (HCC)    Type 2   Dyslipidemia    Dysrhythmia    Palpitations   History of nuclear stress test 02/2009   exercise; normal pattern of perfusion; no significant ischemia demonstrated; low risk scan    Hypertension     Allergies  Allergen Reactions   Carvedilol Other (See Comments)    Causes inc. In wheezing due to asthma    No current facility-administered medications on file prior to encounter.   Current Outpatient Medications on File Prior to Encounter  Medication Sig Dispense Refill   anastrozole (ARIMIDEX) 1 MG tablet TAKE 1 TABLET(1 MG) BY MOUTH DAILY 90 tablet 3   aspirin EC 81 MG tablet Take 81 mg by mouth in the morning. Swallow whole.     CALCIUM-VITAMIN D PO Take 1 tablet by mouth in the morning.     cetirizine (ZYRTEC) 10 MG tablet Take 10 mg by mouth in the morning.     Cyanocobalamin (VITAMIN B-12) 2500 MCG SUBL Place 2,500 mcg under the tongue in the morning.     cyclobenzaprine (FLEXERIL) 5 MG tablet TAKE 1 TABLET BY MOUTH THREE TIMES DAILY FOR 7 DAYS AS NEEDED FOR MUSCLE SPASM     diclofenac (VOLTAREN) 75 MG EC tablet Take 75 mg by mouth 2 (two) times daily.     diltiazem (TIAZAC) 360 MG  24 hr capsule TAKE 1 CAPSULE BY MOUTH DAILY (Patient taking differently: Take 360 mg by mouth in the morning.) 90 capsule 2   fish oil-omega-3 fatty acids 1000 MG capsule Take 1,000 mg by mouth in the morning.     gabapentin (NEURONTIN) 100 MG capsule Take 100 mg by mouth at bedtime.     losartan (COZAAR) 100 MG tablet Take 1 tablet (100 mg total) by mouth daily. (Patient taking differently: Take 100 mg by mouth in the morning.) 90 tablet 3   meloxicam (MOBIC) 7.5 MG tablet Take 7.5 mg by mouth daily as needed for pain.     Menthol, Topical Analgesic, (BIOFREEZE EX) Apply 1 application  topically 2 (two) times daily as needed (pain).     montelukast (SINGULAIR) 10 MG tablet Take 10 mg by mouth daily before lunch.      Multiple Vitamin (MULTIVITAMIN WITH MINERALS) TABS tablet Take 1 tablet by mouth in the morning.     pravastatin (PRAVACHOL) 40 MG tablet TAKE 1 TABLET(40 MG) BY MOUTH DAILY (Patient taking differently: Take 40 mg by mouth at bedtime.) 90 tablet 3   PROAIR HFA 108 (90 Base) MCG/ACT inhaler Inhale 2 puffs into the lungs every 4 (four) hours as needed for wheezing or shortness of breath.   2   sitaGLIPtin-metformin (JANUMET) 50-500 MG tablet Take  1 tablet by mouth 2 (two) times daily with a meal.     triamterene-hydrochlorothiazide (MAXZIDE-25) 37.5-25 MG tablet Take 1 tablet by mouth in the morning.     vitamin E 180 MG (400 UNITS) capsule Take 400 Units by mouth in the morning.      Physical Exam: Clinical exam: Tracy Knight is a pleasant individual, who appears younger than their stated age.  She is alert and orientated 3.  No shortness of breath, chest pain.  Abdomen is soft and non-tender, negative loss of bowel and bladder control, no rebound tenderness. She also denies any numbness or dysesthesias in the perianal and perivaginal region. No saddle dysesthesias.  Negative: skin lesions abrasions contusions  Peripheral pulses: 2+ peripheral pulses in the lower extremity.. LE  compartments are: Soft and nontender.  Gait pattern: Altered gait pattern due to left foot drop as well as left knee osteoarthritis (varus knee deformity)  Assistive devices: Currently using a cane at home.  Neuro: 5/5 motor strength in the right lower extremity. Left lower extremity: 5/5 hip flexor, quad, gastrocnemius strength. 4/5 EHL/tibialis anterior strength. Negative straight leg raise test. Diffuse dysesthesias in the lower extremity left worse than the right. Negative Babinski test, no clonus, symmetrical 1+ deep tendon reflexes.  Musculoskeletal: Moderate back pain with palpation and range of motion. No SI joint pain.  Imaging: X-rays of the lumbar spine taken on 01/13/2023 demonstrated advanced degenerative disc disease L5-S1. No scoliosis or spondylolisthesis. There is a calcified mass in the pelvis just anterior to the sacrum.  Lumbar MRI: completed on 01/14/2023. No fracture is noted. Slight anterior listhesis L3-4. Mild levoconvex scoliosis of the lumbar spine. No evidence of fracture or abnormal marrow signal change. Mild degenerative stenosis L1/2. Large central disc herniation L2-3 producing severe spinal stenosis and moderate lateral recess stenosis. No significant foraminal stenosis noted. Slight anterior listhesis at L3-4 is noted with severe right and moderate left facet arthrosis. No significant mass effect on the thecal sac or nerve roots are noted. Mild degenerative changes L4-5. No significant lateral recess stenosis. Significant loss of normal disc height at L5-S1 without central stenosis. Moderate to severe foraminal stenosis bilaterally affecting the exiting L5 nerve root. Mild impingement/displacement of the descending left S1 nerve root.   A/P: Tracy Knight is a very pleasant 62 year old woman with a 7-week history of significant left foot weakness. It has affected her overall mobility. Her imaging studies failed to demonstrate any significant signs of L5 nerve  compression/radiculopathy. Her clinical exam shows weakness of the EHL/tibialis anterior which would be consistent with L5 nerve compression. While she does have a significant central disc herniation L2-3, she shows no signs of conus or cauda equina syndrome.  She does have a varus knee deformity which could result in irritation to the peroneal nerve which could affect her dorsiflexion strength. Alternatively, she does have advanced degenerative disc disease at L5-S1 which could produce little foraminal stenosis which would affect the exiting L5 nerve root.  At this point I am I am concerned that the severe stenosis at L2-3. I have recommended an L2-3 decompression and excision of disc herniation. However, this may not influence the current foot drop. I have given her an AFO brace. I did tell the patient that in the future she may require an L5-S1 fusion in order to address the foraminal stenosis and degenerative disc disease.

## 2023-02-27 ENCOUNTER — Encounter (HOSPITAL_COMMUNITY): Payer: Self-pay | Admitting: Orthopedic Surgery

## 2023-02-27 DIAGNOSIS — Z853 Personal history of malignant neoplasm of breast: Secondary | ICD-10-CM | POA: Diagnosis not present

## 2023-02-27 DIAGNOSIS — M48061 Spinal stenosis, lumbar region without neurogenic claudication: Secondary | ICD-10-CM | POA: Diagnosis not present

## 2023-02-27 DIAGNOSIS — Z7982 Long term (current) use of aspirin: Secondary | ICD-10-CM | POA: Diagnosis not present

## 2023-02-27 DIAGNOSIS — M5186 Other intervertebral disc disorders, lumbar region: Secondary | ICD-10-CM | POA: Diagnosis not present

## 2023-02-27 DIAGNOSIS — I10 Essential (primary) hypertension: Secondary | ICD-10-CM | POA: Diagnosis not present

## 2023-02-27 DIAGNOSIS — J45909 Unspecified asthma, uncomplicated: Secondary | ICD-10-CM | POA: Diagnosis not present

## 2023-02-27 DIAGNOSIS — E119 Type 2 diabetes mellitus without complications: Secondary | ICD-10-CM | POA: Diagnosis not present

## 2023-02-27 DIAGNOSIS — Z79899 Other long term (current) drug therapy: Secondary | ICD-10-CM | POA: Diagnosis not present

## 2023-02-27 LAB — GLUCOSE, CAPILLARY: Glucose-Capillary: 149 mg/dL — ABNORMAL HIGH (ref 70–99)

## 2023-02-27 NOTE — Plan of Care (Signed)

## 2023-02-27 NOTE — Progress Notes (Signed)
Patient alert and oriented, ambulate, void, surgical site clean and dry no sign of infection. JP drain removed per order.D/c instructions explain and given to the patient.

## 2023-02-27 NOTE — Evaluation (Signed)
Occupational Therapy Evaluation Patient Details Name: Tracy Knight MRN: [id number] DOB: [birth year] Today's Date: 02/27/2023   History of Present Illness Pt is a 62 y.o. female s/p L2-3 decompression and discectomy. PMH significant for arthritis, asthma, breast cancer, DMII, dysrhythmia, HTN.   Clinical Impression   PTA, pt lived with husband and was independent. Pt reporting sister can also assist at discharge as needed. Upon eval, pt performing UB ADL with mod I and LB ADL with up to mod A. Pt educated and demonstrating use of compensatory techniques for bed mobility, LB ADL, grooming, toileting, and shower transfers within precautions. Needing cues and reminders to recall newly learned information, therefore, will follow acutely. Pt however, with skill to reference handout for questions, therefore do not suspect need for follow up therapy after discharge.       If plan is discharge home, recommend the following: A little help with walking and/or transfers;A little help with bathing/dressing/bathroom;Assistance with cooking/housework;Assist for transportation;Help with stairs or ramp for entrance    Functional Status Assessment  Patient has had a recent decline in their functional status and demonstrates the ability to make significant improvements in function in a reasonable and predictable amount of time.  Equipment Recommendations  None recommended by OT    Recommendations for Other Services       Precautions / Restrictions Precautions Precautions: Back Precaution Booklet Issued: Yes (comment) Precaution Comments: Educated on 3/3 back precautions. Handout provided. Required Braces or Orthoses: Spinal Brace Spinal Brace: Lumbar corset;Applied in sitting position Restrictions Weight Bearing Restrictions: No      Mobility Bed Mobility Overal bed mobility: Modified Independent                  Transfers Overall transfer level: Modified independent Equipment used:  Rolling walker (2 wheels)                      Balance Overall balance assessment: Needs assistance Sitting-balance support: No upper extremity supported, Feet supported Sitting balance-Leahy Scale: Good     Standing balance support: Bilateral upper extremity supported, No upper extremity supported, During functional activity Standing balance-Leahy Scale: Fair Standing balance comment: static stand without UE support. RW for amb.                           ADL either performed or assessed with clinical judgement   ADL Overall ADL's : Needs assistance/impaired Eating/Feeding: Independent   Grooming: Modified independent;Standing   Upper Body Bathing: Modified independent;Sitting   Lower Body Bathing: Set up;Sit to/from stand   Upper Body Dressing : Modified independent;Sitting   Lower Body Dressing: Moderate assistance;Sit to/from stand Lower Body Dressing Details (indicate cue type and reason): Mod A to don L shoe due to has AFO and difficult to manage with figure 4 position Toilet Transfer: Supervision/safety;Rolling walker (2 wheels)   Toileting- Clothing Manipulation and Hygiene: Supervision/safety;Sitting/lateral lean;Sit to/from stand   Tub/ Shower Transfer: Walk-in shower;Supervision/safety;Ambulation;Rolling walker (2 wheels)   Functional mobility during ADLs: Supervision/safety;Rolling walker (2 wheels)       Vision Baseline Vision/History: 1 Wears glasses Ability to See in Adequate Light: 0 Adequate Patient Visual Report: No change from baseline Vision Assessment?: No apparent visual deficits     Perception Perception: Not tested       Praxis Praxis: Not tested       Pertinent Vitals/Pain Pain Assessment Pain Assessment: Faces Faces Pain Scale: Hurts little more Pain Location: incisional  Pain Descriptors / Indicators: Discomfort, Tender Pain Intervention(s): Limited activity within patient's tolerance, Monitored during session      Extremity/Trunk Assessment Upper Extremity Assessment Upper Extremity Assessment: Overall WFL for tasks assessed   Lower Extremity Assessment Lower Extremity Assessment: Defer to PT evaluation   Cervical / Trunk Assessment Cervical / Trunk Assessment: Back Surgery   Communication Communication Communication: No apparent difficulties   Cognition Arousal: Alert Behavior During Therapy: WFL for tasks assessed/performed Overall Cognitive Status: Within Functional Limits for tasks assessed                                 General Comments: Min cues to recall precautions, but overall good maintenance during session. Will continue to follow for information carry-over     General Comments  VSS on RA. Receptive to all education provided. Intermittently needing cues for recall of new education    Exercises     Shoulder Instructions      Home Living Family/patient expects to be discharged to:: Private residence Living Arrangements: Spouse/significant other;Children Available Help at Discharge: Family;Available 24 hours/day Type of Home: House Home Access: Stairs to enter [employer] of Steps: 4 Entrance Stairs-Rails: Right;Left Home Layout: One level     Bathroom Shower/Tub: Producer, television/film/video: Handicapped height     Home Equipment: Agricultural consultant (2 wheels);Rollator (4 wheels)          Prior Functioning/Environment Prior Level of Function : Independent/Modified Independent;Driving             Mobility Comments: has been using RW/rollator for past month due to bilat knee pain. ADLs Comments: Per pt independent        OT Problem List: Decreased strength;Decreased activity tolerance;Impaired balance (sitting and/or standing);Decreased knowledge of use of DME or AE;Decreased safety awareness;Decreased knowledge of precautions;Pain      OT Treatment/Interventions: Self-care/ADL training;Therapeutic exercise;DME and/or AE  instruction;Balance training;Patient/family education;Therapeutic activities    OT Goals(Current goals can be found in the care plan section) Acute Rehab OT Goals Patient Stated Goal: get better OT Goal Formulation: With patient Time For Goal Achievement: 03/13/23 Potential to Achieve Goals: Good  OT Frequency: Min 2X/week    Co-evaluation              AM-PAC OT "6 Clicks" Daily Activity     Outcome Measure Help from another person eating meals?: None Help from another person taking care of personal grooming?: None Help from another person toileting, which includes using toliet, bedpan, or urinal?: A Little Help from another person bathing (including washing, rinsing, drying)?: A Little Help from another person to put on and taking off regular upper body clothing?: None Help from another person to put on and taking off regular lower body clothing?: A Little 6 Click Score: 21   End of Session Equipment Utilized During Treatment: Gait belt;Rolling walker (2 wheels);Back brace Nurse Communication: Mobility status  Activity Tolerance: Patient tolerated treatment well Patient left: in bed;with call bell/phone within reach  OT Visit Diagnosis: Unsteadiness on feet (R26.81);Muscle weakness (generalized) (M62.81)                Time: 3875-6433 OT Time Calculation (min): 25 min Charges:  OT General Charges $OT Visit: 1 Visit OT Evaluation $OT Eval Low Complexity: 1 Low OT Treatments $Self Care/Home Management : 8-22 mins  Tyler Deis, OTR/L [facility] Acute Rehabilitation Office: [phone]   Myrla Halsted 02/27/2023,  10:25 AM

## 2023-02-27 NOTE — Discharge Summary (Signed)
Patient ID: Tracy Knight MRN: [id number] DOB/AGE: Mar 09, 1961 62 y.o.  Admit date: 02/26/2023 Discharge date: 02/27/2023  Admission Diagnoses:  Principal Problem:   Status post lumbar spine operative procedure for decompression of spinal cord   Discharge Diagnoses:  Principal Problem:   Status post lumbar spine operative procedure for decompression of spinal cord  status post Procedure(s): LUMBAR TWO TO THREE DECOMPRESSION AND DISCECTOMY  Past Medical History:  Diagnosis Date   Arthritis    Asthma    Cancer (HCC) 11/2018   left breast cancer   Diabetes mellitus without complication (HCC)    Type 2   Dyslipidemia    Dysrhythmia    Palpitations   History of nuclear stress test 02/2009   exercise; normal pattern of perfusion; no significant ischemia demonstrated; low risk scan    Hypertension     Surgeries: Procedure(s): LUMBAR TWO TO THREE DECOMPRESSION AND DISCECTOMY on 02/26/2023   Consultants:   Discharged Condition: Improved  Hospital Course: Tracy Knight is an 62 y.o. adult who was admitted 02/26/2023 for operative treatment of Status post lumbar spine operative procedure for decompression of spinal cord. Patient failed conservative treatments (please see the history and physical for the specifics) and had severe unremitting pain that affects sleep, daily activities and work/hobbies. After pre-op clearance, the patient was taken to the operating room on 02/26/2023 and underwent  Procedure(s): LUMBAR TWO TO THREE DECOMPRESSION AND DISCECTOMY.    Patient was given perioperative antibiotics:  Anti-infectives (From admission, onward)    Start     Dose/Rate Route Frequency Ordered Stop   02/26/23 1900  ceFAZolin (ANCEF) IVPB 1 g/50 mL premix        1 g 100 mL/hr over 30 Minutes Intravenous Every 8 hours 02/26/23 1613 02/27/23 0402   02/26/23 0916  ceFAZolin (ANCEF) IVPB 2g/100 mL premix        2 g 200 mL/hr over 30 Minutes Intravenous 30 min pre-op 02/26/23 0916  02/26/23 1130        Patient was given sequential compression devices and early ambulation to prevent DVT.   Patient benefited maximally from hospital stay and there were no complications. At the time of discharge, the patient was urinating/moving their bowels without difficulty, tolerating a regular diet, pain is controlled with oral pain medications and they have been cleared by PT/OT.   Recent vital signs: Patient Vitals for the past 24 hrs:  BP Temp Temp src Pulse Resp SpO2 Height Weight  02/27/23 0734 (!) 159/84 98.7 F (37.1 C) Oral 92 19 99 % -- --  02/27/23 0325 126/72 98.8 F (37.1 C) Oral 87 18 99 % -- --  02/26/23 2252 125/72 99.5 F (37.5 C) Oral 86 18 96 % -- --  02/26/23 1934 (!) 144/73 98.1 F (36.7 C) Oral 84 18 97 % -- --  02/26/23 1611 132/75 98.6 F (37 C) Oral 80 20 98 % -- --  02/26/23 1545 134/72 97.8 F (36.6 C) -- 82 13 93 % -- --  02/26/23 1530 139/72 -- -- 81 11 93 % -- --  02/26/23 1515 127/69 -- -- 82 11 92 % -- --  02/26/23 1500 133/68 -- -- 83 13 93 % -- --  02/26/23 1445 124/68 97.8 F (36.6 C) -- 81 18 100 % -- --  02/26/23 0910 (!) 141/74 98.1 F (36.7 C) -- 73 18 98 % 5\' 3"  (1.6 m) 85.7 kg     Recent laboratory studies: No results for input(s): "WBC", "HGB", "  HCT", "PLT", "NA", "K", "CL", "CO2", "BUN", "CREATININE", "GLUCOSE", "INR", "CALCIUM" in the last 72 hours.  Invalid input(s): "PT", "2"   Discharge Medications:   Allergies as of 02/27/2023       Reactions   Carvedilol Other (See Comments)   Causes inc. In wheezing due to asthma        Medication List     STOP taking these medications    aspirin EC 81 MG tablet   BIOFREEZE EX   cyclobenzaprine 5 MG tablet Commonly known as: FLEXERIL   diclofenac 75 MG EC tablet Commonly known as: VOLTAREN   fish oil-omega-3 fatty acids 1000 MG capsule   meloxicam 7.5 MG tablet Commonly known as: MOBIC   multivitamin with minerals Tabs tablet   vitamin E 180 MG (400 UNITS)  capsule       TAKE these medications    anastrozole 1 MG tablet Commonly known as: ARIMIDEX TAKE 1 TABLET(1 MG) BY MOUTH DAILY   CALCIUM-VITAMIN D PO Take 1 tablet by mouth in the morning.   cetirizine 10 MG tablet Commonly known as: ZYRTEC Take 10 mg by mouth in the morning.   diltiazem 360 MG 24 hr capsule Commonly known as: TIAZAC TAKE 1 CAPSULE BY MOUTH DAILY What changed: when to take this   gabapentin 100 MG capsule Commonly known as: NEURONTIN Take 100 mg by mouth at bedtime.   Janumet 50-500 MG tablet Generic drug: sitaGLIPtin-metformin Take 1 tablet by mouth 2 (two) times daily with a meal.   losartan 100 MG tablet Commonly known as: COZAAR Take 1 tablet (100 mg total) by mouth daily. What changed: when to take this   methocarbamol 500 MG tablet Commonly known as: ROBAXIN Take 1 tablet (500 mg total) by mouth every 8 (eight) hours as needed for up to 5 days for muscle spasms.   montelukast 10 MG tablet Commonly known as: SINGULAIR Take 10 mg by mouth daily before lunch.   ondansetron 4 MG tablet Commonly known as: Zofran Take 1 tablet (4 mg total) by mouth every 8 (eight) hours as needed for nausea or vomiting.   oxyCODONE-acetaminophen 10-325 MG tablet Commonly known as: Percocet Take 1 tablet by mouth every 6 (six) hours as needed for up to 5 days for pain.   pravastatin 40 MG tablet Commonly known as: PRAVACHOL TAKE 1 TABLET(40 MG) BY MOUTH DAILY What changed: See the new instructions.   ProAir HFA 108 (90 Base) MCG/ACT inhaler Generic drug: albuterol Inhale 2 puffs into the lungs every 4 (four) hours as needed for wheezing or shortness of breath.   triamterene-hydrochlorothiazide 37.5-25 MG tablet Commonly known as: MAXZIDE-25 Take 1 tablet by mouth in the morning.   Vitamin B-12 2500 MCG Subl Place 2,500 mcg under the tongue in the morning.        Diagnostic Studies: DG Lumbar Spine 1 View  Result Date: 02/26/2023 CLINICAL DATA:   Elective surgery.  Intraoperative placement. EXAM: LUMBAR SPINE - 1 VIEW COMPARISON:  02/26/2023 FINDINGS: A single cross-table lateral view of the lower lumbar spine is obtained for surgical localization purposes. Localization markers are projecting over the posterior elements of L2 and L3. IMPRESSION: Images obtained for surgical localization purposes. Electronically Signed   By: Burman Nieves M.D.   On: 02/26/2023 17:13   DG Lumbar Spine 2-3 Views  Result Date: 02/26/2023 CLINICAL DATA:  Intraoperative localization EXAM: LUMBAR SPINE - 2-3 VIEW COMPARISON:  None Available. FINDINGS: No outside comparison imaging to assess lumbar segmentation from above.  Assuming L5-S1 is the lowest open disc space the retractor on film 3 is centered at the L2-3 level. Generalized disc narrowing with intervertebral vacuum phenomenon, greatest at L5-S1. The image was numbered as requested and findings called to OR room at [phone] at 11:57 a.m. IMPRESSION: Intraoperative localization as described. Electronically Signed   By: Tiburcio Pea M.D.   On: 02/26/2023 11:58    Discharge Instructions     Incentive spirometry RT   Complete by: As directed         Follow-up Information     Venita Lick, MD. Schedule an appointment as soon as possible for a visit in 2 week(s).   Specialty: Orthopedic Surgery Why: If symptoms worsen, For suture removal, For wound re-check Contact information: [address] [place] [address] [phone]                 Discharge Plan:  discharge to home  Disposition: Tracy Knight is s/p L2/3 decompression and disectomy.  Overall doing well. Ambulating without significant leg pain.  Voiding spontaneously, decreased neuropathic leg pain, and dressing c/d/i.  Minimal output from drain - will remove today.  Plan on d/c to home after PT/OT evaluation and clearance.  Medications and instructions provided.  She will f/u with me in 2 weeks.     Signed: Alvy Beal for Dr. Venita Lick [facility] [phone] 02/27/2023, 7:35 AM

## 2023-02-27 NOTE — Evaluation (Signed)
Physical Therapy Evaluation Patient Details Name: Tracy Knight MRN: [id number] DOB: 08-15-1960 Today's Date: 02/27/2023  History of Present Illness  Pt is a 62 y.o. female s/p L2-3 decompression and discectomy. PMH significant for arthritis, asthma, breast cancer, DMII, dysrhythmia, HTN.   Clinical Impression  PT eval complete. Pt mod I bed mobility and transfers. Supervision amb 300' with RW. CGA ascend/descend 4 steps with R rail. Pt educated on 3/3 back precautions and brace wear schedule. Pt will have needed level of assist at home from family. No follow up PT intervention indicated. Pt has all needed DME. Plan is for d/c home today. PT signing off.         If plan is discharge home, recommend the following: A little help with bathing/dressing/bathroom;Assistance with cooking/housework;Help with stairs or ramp for entrance;Assist for transportation   Can travel by private vehicle        Equipment Recommendations None recommended by PT  Recommendations for Other Services       Functional Status Assessment Patient has had a recent decline in their functional status and demonstrates the ability to make significant improvements in function in a reasonable and predictable amount of time.     Precautions / Restrictions Precautions Precautions: Back Precaution Comments: Educated on 3/3 back precautions. Handout provided. Required Braces or Orthoses: Spinal Brace Spinal Brace: Lumbar corset;Applied in sitting position      Mobility  Bed Mobility Overal bed mobility: Modified Independent                  Transfers Overall transfer level: Modified independent Equipment used: Rolling walker (2 wheels)                    Ambulation/Gait Ambulation/Gait assistance: Supervision Gait Distance (Feet): 300 Feet Assistive device: Rolling walker (2 wheels) Gait Pattern/deviations: Step-through pattern Gait velocity: decreased Gait velocity interpretation: 1.31 - 2.62  ft/sec, indicative of limited community ambulator   General Gait Details: cues for posture. Steady gait with RW.  Stairs Stairs: Yes Stairs assistance: Contact guard assist Stair Management: One rail Right, Step to pattern, Forwards Number of Stairs: 4    Wheelchair Mobility     Tilt Bed    Modified Rankin (Stroke Patients Only)       Balance Overall balance assessment: Needs assistance Sitting-balance support: No upper extremity supported, Feet supported Sitting balance-Leahy Scale: Good     Standing balance support: Bilateral upper extremity supported, No upper extremity supported, During functional activity Standing balance-Leahy Scale: Fair Standing balance comment: static stand without UE support. RW for amb.                             Pertinent Vitals/Pain Pain Assessment Pain Assessment: 0-10 Pain Score: 4  Pain Location: incisional Pain Descriptors / Indicators: Discomfort, Tender Pain Intervention(s): Monitored during session    Home Living Family/patient expects to be discharged to:: Private residence Living Arrangements: Spouse/significant other;Children Available Help at Discharge: Family;Available 24 hours/day Type of Home: House Home Access: Stairs to enter Entrance Stairs-Rails: Doctor, general practice of Steps: 4   Home Layout: One level Home Equipment: Agricultural consultant (2 wheels);Rollator (4 wheels)      Prior Function Prior Level of Function : Independent/Modified Independent;Driving             Mobility Comments: has been using RW/rollator for past month due to bilat knee pain.       Extremity/Trunk Assessment  Upper Extremity Assessment Upper Extremity Assessment: Defer to OT evaluation    Lower Extremity Assessment Lower Extremity Assessment: Overall WFL for tasks assessed    Cervical / Trunk Assessment Cervical / Trunk Assessment: Back Surgery  Communication   Communication Communication: No  apparent difficulties  Cognition Arousal: Alert Behavior During Therapy: WFL for tasks assessed/performed Overall Cognitive Status: Within Functional Limits for tasks assessed                                          General Comments General comments (skin integrity, edema, etc.): VSS on RA    Exercises     Assessment/Plan    PT Assessment Patient does not need any further PT services  PT Problem List         PT Treatment Interventions      PT Goals (Current goals can be found in the Care Plan section)  Acute Rehab PT Goals Patient Stated Goal: home PT Goal Formulation: All assessment and education complete, DC therapy    Frequency       Co-evaluation               AM-PAC PT "6 Clicks" Mobility  Outcome Measure Help needed turning from your back to your side while in a flat bed without using bedrails?: None Help needed moving from lying on your back to sitting on the side of a flat bed without using bedrails?: None Help needed moving to and from a bed to a chair (including a wheelchair)?: None Help needed standing up from a chair using your arms (e.g., wheelchair or bedside chair)?: None Help needed to walk in hospital room?: A Little Help needed climbing 3-5 steps with a railing? : A Little 6 Click Score: 22    End of Session Equipment Utilized During Treatment: Gait belt;Back brace Activity Tolerance: Patient tolerated treatment well Patient left: in bed;with call bell/phone within reach Nurse Communication: Mobility status PT Visit Diagnosis: Difficulty in walking, not elsewhere classified (R26.2)    Time: 1610-9604 PT Time Calculation (min) (ACUTE ONLY): 19 min   Charges:   PT Evaluation $PT Eval Low Complexity: 1 Low   PT General Charges $$ ACUTE PT VISIT: 1 Visit         Ferd Glassing., PT  Office # [phone]   Ilda Foil 02/27/2023, 8:29 AM

## 2023-02-27 NOTE — Anesthesia Postprocedure Evaluation (Signed)
Anesthesia Post Note  Patient: ASIAH PINA  Procedure(s) Performed: LUMBAR TWO TO THREE DECOMPRESSION AND DISCECTOMY (Spine Lumbar)     Patient location during evaluation: PACU Anesthesia Type: General Level of consciousness: awake and alert Pain management: pain level controlled Vital Signs Assessment: post-procedure vital signs reviewed and stable Respiratory status: spontaneous breathing, nonlabored ventilation and respiratory function stable Cardiovascular status: blood pressure returned to baseline and stable Postop Assessment: no apparent nausea or vomiting Anesthetic complications: no   No notable events documented.  Last Vitals:  Vitals:   02/26/23 2252 02/27/23 0325  BP: 125/72 126/72  Pulse: 86 87  Resp: 18 18  Temp: 37.5 C 37.1 C  SpO2: 96% 99%    Last Pain:  Vitals:   02/27/23 0325  TempSrc: Oral  PainSc: 5                  Lowella Curb

## 2023-03-04 ENCOUNTER — Other Ambulatory Visit: Payer: Self-pay | Admitting: Hematology and Oncology

## 2023-04-03 ENCOUNTER — Ambulatory Visit: Payer: BC Managed Care – PPO | Attending: Cardiovascular Disease | Admitting: Cardiovascular Disease

## 2023-04-03 ENCOUNTER — Encounter: Payer: Self-pay | Admitting: Cardiovascular Disease

## 2023-04-03 VITALS — BP 128/72 | HR 65 | Ht 63.0 in | Wt 193.4 lb

## 2023-04-03 DIAGNOSIS — R002 Palpitations: Secondary | ICD-10-CM | POA: Diagnosis not present

## 2023-04-03 DIAGNOSIS — E119 Type 2 diabetes mellitus without complications: Secondary | ICD-10-CM

## 2023-04-03 DIAGNOSIS — E669 Obesity, unspecified: Secondary | ICD-10-CM | POA: Diagnosis not present

## 2023-04-03 DIAGNOSIS — E7800 Pure hypercholesterolemia, unspecified: Secondary | ICD-10-CM

## 2023-04-03 DIAGNOSIS — I10 Essential (primary) hypertension: Secondary | ICD-10-CM | POA: Diagnosis not present

## 2023-04-03 NOTE — Patient Instructions (Signed)

## 2023-04-03 NOTE — Progress Notes (Signed)
Cardiology Office Note    Date:  04/03/2023   ID:  TYAH MERY, DOB 18-Aug-1960, MRN [id number]  PCP:  Assunta Found, MD  Cardiologist:   Thurmon Fair, MD   Chief Complaint  Patient presents with   Hypertension    History of Present Illness:  Tracy Knight is a 62 y.o. adult with obesity, hyperlipidemia, type 2 diabetes mellitus, hypertension and palpitations here for routine follow-up.   She has done well from a cardiovascular point of view, but has had several orthopedic problems.  She underwent lumbar spine fusion since she developed neuropathic symptoms in her lower extremities.  She had surgery about 5 weeks ago and is just getting ready to restart physical therapy.  Her symptoms in her legs have improved, but she has a lot of pain in her left knee.  She is planning to undergo knee replacement surgery with Dr. Charlann Boxer in the future.  Her blood pressure has been well-controlled on a combination of diltiazem, thiazide/potassium sparing diuretic and losartan.  Most recent hemoglobin A1c was good at 6.5% and she had an excellent lipid profile in May.  Its been approximately 4 years since she completed treatment for right breast cancer with surgery and radiation therapy in 2020.  She is still taking Arimidex.  The patient specifically denies any chest pain at rest exertion, dyspnea at rest or with exertion, orthopnea, paroxysmal nocturnal dyspnea, syncope, palpitations, focal neurological deficits, intermittent claudication, lower extremity edema, unexplained weight gain, cough, hemoptysis or wheezing.  She had a remote cardiac catheterization in 2005 with normal coronary arteries. Her echo shows normal left ventricular ejection fraction, borderline left atrial enlargement and borderline criteria for mitral valve prolapse, mild mitral insufficiency. Past treatment with carvedilol caused problems with wheezing and asthma    Past Medical History:  Diagnosis Date   Arthritis    Asthma     Cancer (HCC) 11/2018   left breast cancer   Diabetes mellitus without complication (HCC)    Type 2   Dyslipidemia    Dysrhythmia    Palpitations   History of nuclear stress test 02/2009   exercise; normal pattern of perfusion; no significant ischemia demonstrated; low risk scan    Hypertension     Past Surgical History:  Procedure Laterality Date   BREAST RECONSTRUCTION  04/16/2020    Removal left chest tissue expander and placement silicone implant 2. Latissimus dorsi flap to left chest 3. Right breast reduction   BREAST RECONSTRUCTION WITH PLACEMENT OF TISSUE EXPANDER AND ALLODERM Left 02/18/2019   Procedure: LEFT BREAST RECONSTRUCTION WITH PLACEMENT OF TISSUE EXPANDER AND ALLODERM;  Surgeon: Glenna Fellows, MD;  Location: [facility];  Service: Plastics;  Laterality: Left;   BREAST REDUCTION SURGERY Right 04/16/2020   Procedure: RIGHT BREAST REDUCTION;  Surgeon: Glenna Fellows, MD;  Location: [facility];  Service: Plastics;  Laterality: Right;   CARDIAC CATHETERIZATION  07/2003   no evidence of CAD   COLONOSCOPY N/A 02/17/2017   Procedure: COLONOSCOPY;  Surgeon: Corbin Ade, MD;  Location: [facility];  Service: Endoscopy;  Laterality: N/A;  10:00am   COLONOSCOPY WITH PROPOFOL N/A 06/02/2022   Procedure: COLONOSCOPY WITH PROPOFOL;  Surgeon: Corbin Ade, MD;  Location: [facility];  Service: Endoscopy;  Laterality: N/A;  8:15am, asa 2   LATISSIMUS FLAP TO BREAST Left 04/16/2020   Procedure: LEFT LATISSIMUS DORSI FLAP TO LEFT CHEST;  Surgeon: Glenna Fellows, MD;  Location: [facility];  Service: Plastics;  Laterality: Left;  LUMBAR LAMINECTOMY/DECOMPRESSION MICRODISCECTOMY N/A 02/26/2023   Procedure: LUMBAR TWO TO THREE DECOMPRESSION AND DISCECTOMY;  Surgeon: Venita Lick, MD;  [facility];  Service: Orthopedics;  Laterality: N/A;  3 C-Bed   MASTECTOMY W/ SENTINEL NODE BIOPSY Left 02/18/2019   Procedure: LEFT MASTECTOMY WITH LEFT AXILLARY SENTINEL LYMPH NODE  BIOPSY;  Surgeon: Ovidio Kin, MD;  Location: [facility];  Service: General;  Laterality: Left;   POLYPECTOMY  02/17/2017   Procedure: POLYPECTOMY;  Surgeon: Corbin Ade, MD;  [facility];  Service: Endoscopy;;  colon   removal cyst on chin     REMOVAL OF TISSUE EXPANDER AND PLACEMENT OF IMPLANT Left 04/16/2020   Procedure: REMOVAL OF LEFT CHEST TISSUE EXPANDER AND PLACEMENT OF IMPLANT;  Surgeon: Glenna Fellows, MD;  Location: [facility];  Service: Plastics;  Laterality: Left;   TRANSTHORACIC ECHOCARDIOGRAM  03/2007   EF=>55%; borderline LA enlargement; MV mildly thickened/borderline MVP/mild MR    Current Medications: Outpatient Medications Prior to Visit  Medication Sig Dispense Refill   anastrozole (ARIMIDEX) 1 MG tablet TAKE 1 TABLET(1 MG) BY MOUTH DAILY 90 tablet 3   CALCIUM-VITAMIN D PO Take 1 tablet by mouth in the morning.     cetirizine (ZYRTEC) 10 MG tablet Take 10 mg by mouth in the morning.     Cyanocobalamin (VITAMIN B-12) 2500 MCG SUBL Place 2,500 mcg under the tongue in the morning.     diltiazem (TIAZAC) 360 MG 24 hr capsule TAKE 1 CAPSULE BY MOUTH DAILY (Patient taking differently: Take 360 mg by mouth in the morning.) 90 capsule 2   gabapentin (NEURONTIN) 100 MG capsule Take 100 mg by mouth at bedtime.     losartan (COZAAR) 100 MG tablet Take 1 tablet (100 mg total) by mouth daily. (Patient taking differently: Take 100 mg by mouth in the morning.) 90 tablet 3   montelukast (SINGULAIR) 10 MG tablet Take 10 mg by mouth daily before lunch.      pravastatin (PRAVACHOL) 40 MG tablet TAKE 1 TABLET(40 MG) BY MOUTH DAILY (Patient taking differently: Take 40 mg by mouth at bedtime.) 90 tablet 3   PROAIR HFA 108 (90 Base) MCG/ACT inhaler Inhale 2 puffs into the lungs every 4 (four) hours as needed for wheezing or shortness of breath.   2   sitaGLIPtin-metformin (JANUMET) 50-500 MG tablet Take 1 tablet by mouth 2 (two) times daily with a meal.      triamterene-hydrochlorothiazide (MAXZIDE-25) 37.5-25 MG tablet Take 1 tablet by mouth in the morning.     ondansetron (ZOFRAN) 4 MG tablet Take 1 tablet (4 mg total) by mouth every 8 (eight) hours as needed for nausea or vomiting. (Patient not taking: Reported on 04/03/2023) 20 tablet 0   No facility-administered medications prior to visit.     Allergies:   Carvedilol   Social History   Socioeconomic History   Marital status: Married    Spouse name: Not on file   Number of children: Not on file   Years of education: Not on file   Highest education level: Not on file  Occupational History   Not on file  Tobacco Use   Smoking status: Never   Smokeless tobacco: Never   Tobacco comments:    never smoke.  Vaping Use   Vaping status: Never Used  Substance and Sexual Activity   Alcohol use: No   Drug use: No   Sexual activity: Yes    Birth control/protection: Post-menopausal  Other Topics Concern  Not on file  Social History Narrative   Not on file   Social Determinants of Health   Financial Resource Strain: Low Risk  (09/27/2020)   Overall Financial Resource Strain (CARDIA)    Difficulty of Paying Living Expenses: Not hard at all  Food Insecurity: No Food Insecurity (09/27/2020)   Hunger Vital Sign    Worried About Running Out of Food in the Last Year: Never true    Ran Out of Food in the Last Year: Never true  Transportation Needs: No Transportation Needs (09/27/2020)   PRAPARE - Administrator, Civil Service (Medical): No    Lack of Transportation (Non-Medical): No  Physical Activity: Insufficiently Active (09/27/2020)   Exercise Vital Sign    Days of Exercise per Week: 3 days    Minutes of Exercise per Session: 20 min  Stress: No Stress Concern Present (09/27/2020)   [employer] of Occupational Health - Occupational Stress Questionnaire    Feeling of Stress : Not at all  Social Connections: Moderately Integrated (09/27/2020)   Social Connection and  Isolation Panel [NHANES]    Frequency of Communication with Friends and Family: More than three times a week    Frequency of Social Gatherings with Friends and Family: Once a week    Attends Religious Services: More than 4 times per year    Active Member of [employer] or Organizations: No    Attends Engineer, structural: Never    Marital Status: Married     Family History:  The patient's family history includes Diabetes in her daughter; Heart disease in her father and mother; Hypertension in her daughter and sister.   ROS:   Please see the history of present illness.    ROS All other systems are reviewed and are negative.   PHYSICAL EXAM:   VS:  BP 128/72 (BP Location: Right Arm, Patient Position: Sitting, Cuff Size: Large)   Pulse 65   Ht 5\' 3"  (1.6 m)   Wt 193 lb 6.4 oz (87.7 kg)   SpO2 98%   BMI 34.26 kg/m      General: Alert, oriented x3, no distress, obese Head: no evidence of trauma, PERRL, EOMI, no exophtalmos or lid lag, no myxedema, no xanthelasma; normal ears, nose and oropharynx Neck: normal jugular venous pulsations and no hepatojugular reflux; brisk carotid pulses without delay and no carotid bruits Chest: clear to auscultation, no signs of consolidation by percussion or palpation, normal fremitus, symmetrical and full respiratory excursions Cardiovascular: normal position and quality of the apical impulse, regular rhythm, normal first and second heart sounds, no murmurs, rubs or gallops Abdomen: no tenderness or distention, no masses by palpation, no abnormal pulsatility or arterial bruits, normal bowel sounds, no hepatosplenomegaly Extremities: no clubbing, cyanosis or edema; 2+ radial, ulnar and brachial pulses bilaterally; 2+ right femoral, posterior tibial and dorsalis pedis pulses; 2+ left femoral, posterior tibial and dorsalis pedis pulses; no subclavian or femoral bruits Neurological: grossly nonfocal Psych: Normal mood and affect     Wt Readings from  Last 3 Encounters:  04/03/23 193 lb 6.4 oz (87.7 kg)  02/26/23 189 lb (85.7 kg)  02/16/23 190 lb 11.2 oz (86.5 kg)     Studies Reviewed: Marland Kitchen        ECG 02/16/2023 personally reviewed, shows normal sinus rhythm, normal tracing Risk Assessment/Calculations:      Recent Labs:     Latest Ref Rng & Units 02/16/2023   11:53 AM 05/26/2022   12:20 PM 04/12/2020  11:00 AM  BMP  Glucose 70 - 99 mg/dL 283  662  947   BUN 8 - 23 mg/dL 20  21  18    Creatinine 0.44 - 1.00 mg/dL 6.54  6.50  3.54   Sodium 135 - 145 mmol/L 140  139  140   Potassium 3.5 - 5.1 mmol/L 3.4  3.4  3.4   Chloride 98 - 111 mmol/L 100  100  100   CO2 22 - 32 mmol/L 27  29  27    Calcium 8.9 - 10.3 mg/dL 9.8  65.6  81.2      75/17/0017 Hemoglobin A1c 6.1% Total cholesterol 170, HDL 74, triglycerides 85  49/44/9675 Hemoglobin 11.9, creatinine 0.86, normal liver function tests  11/19/2022 cholesterol 164, HDL 71, LDL 76, triglycerides 94  03/15/3845 hemoglobin A1c 6.5%, creatinine 0.93, potassium 3.4  Lipid Panel    Component Value Date/Time   CHOL 167 04/06/2013 1049   TRIG 52 04/06/2013 1049   HDL 72 04/06/2013 1049   CHOLHDL 2.3 04/06/2013 1049   VLDL 10 04/06/2013 1049   LDLCALC 85 04/06/2013 1049     ASSESSMENT:    1. Palpitations   2. Essential hypertension   3. Non-insulin treated type 2 diabetes mellitus (HCC)   4. Mild obesity   5. Hypercholesterolemia       PLAN:  In order of problems listed above:  Palpitations: Asymptomatic on calcium channel blocker.  Had wheezing with carvedilol. HTN: Well-controlled.  Note borderline hypokalemia earlier this year. DM: Good control Obesity: Harder to exercise now, makes it even more important to double down on a careful diet. HLP: Excellent lipid profile.  Continue pravastatin.   Medication Adjustments/Labs and Tests Ordered: Current medicines are reviewed at length with the patient today.  Concerns regarding medicines are outlined above.   Medication changes, Labs and Tests ordered today are listed in the Patient Instructions below. Patient Instructions  Medication Instructions:  No changes *If you need a refill on your cardiac medications before your next appointment, please call your pharmacy*  Follow-Up: At [facility], you and your health needs are our priority.  As part of our continuing mission to provide you with exceptional heart care, we have created designated Provider Care Teams.  These Care Teams include your primary Cardiologist (physician) and Advanced Practice Providers (APPs -  Physician Assistants and Nurse Practitioners) who all work together to provide you with the care you need, when you need it.  We recommend signing up for the patient portal called "MyChart".  Sign up information is provided on this After Visit Summary.  MyChart is used to connect with patients for Virtual Visits (Telemedicine).  Patients are able to view lab/test results, encounter notes, upcoming appointments, etc.  Non-urgent messages can be sent to your provider as well.   To learn more about what you can do with MyChart, go to [email or web address].    Your next appointment:   1 year(s)  Provider:   Thurmon Fair, MD

## 2023-04-09 DIAGNOSIS — M545 Low back pain, unspecified: Secondary | ICD-10-CM | POA: Diagnosis not present

## 2023-04-15 DIAGNOSIS — M545 Low back pain, unspecified: Secondary | ICD-10-CM | POA: Diagnosis not present

## 2023-04-16 DIAGNOSIS — M545 Low back pain, unspecified: Secondary | ICD-10-CM | POA: Diagnosis not present

## 2023-04-20 DIAGNOSIS — M545 Low back pain, unspecified: Secondary | ICD-10-CM | POA: Diagnosis not present

## 2023-04-21 DIAGNOSIS — M545 Low back pain, unspecified: Secondary | ICD-10-CM | POA: Diagnosis not present

## 2023-04-29 DIAGNOSIS — M545 Low back pain, unspecified: Secondary | ICD-10-CM | POA: Diagnosis not present

## 2023-04-30 DIAGNOSIS — M545 Low back pain, unspecified: Secondary | ICD-10-CM | POA: Diagnosis not present

## 2023-05-04 DIAGNOSIS — M545 Low back pain, unspecified: Secondary | ICD-10-CM | POA: Diagnosis not present

## 2023-05-05 DIAGNOSIS — M545 Low back pain, unspecified: Secondary | ICD-10-CM | POA: Diagnosis not present

## 2023-05-11 ENCOUNTER — Ambulatory Visit (HOSPITAL_COMMUNITY)
Admission: RE | Admit: 2023-05-11 | Discharge: 2023-05-11 | Disposition: A | Discharge status: Home / Self Care | Payer: BC Managed Care – PPO | Source: Ambulatory Visit | Attending: Hematology and Oncology | Admitting: Hematology and Oncology

## 2023-05-11 DIAGNOSIS — M85851 Other specified disorders of bone density and structure, right thigh: Secondary | ICD-10-CM | POA: Diagnosis not present

## 2023-05-11 DIAGNOSIS — C50812 Malignant neoplasm of overlapping sites of left female breast: Secondary | ICD-10-CM | POA: Insufficient documentation

## 2023-05-11 DIAGNOSIS — Z170 Estrogen receptor positive status [ER+]: Secondary | ICD-10-CM | POA: Insufficient documentation

## 2023-05-11 DIAGNOSIS — Z780 Asymptomatic menopausal state: Secondary | ICD-10-CM | POA: Diagnosis not present

## 2023-05-13 DIAGNOSIS — M545 Low back pain, unspecified: Secondary | ICD-10-CM | POA: Diagnosis not present

## 2023-05-14 DIAGNOSIS — M545 Low back pain, unspecified: Secondary | ICD-10-CM | POA: Diagnosis not present

## 2023-05-15 DIAGNOSIS — M1712 Unilateral primary osteoarthritis, left knee: Secondary | ICD-10-CM | POA: Diagnosis not present

## 2023-05-18 DIAGNOSIS — M545 Low back pain, unspecified: Secondary | ICD-10-CM | POA: Diagnosis not present

## 2023-05-19 DIAGNOSIS — E6609 Other obesity due to excess calories: Secondary | ICD-10-CM | POA: Diagnosis not present

## 2023-05-19 DIAGNOSIS — E785 Hyperlipidemia, unspecified: Secondary | ICD-10-CM | POA: Diagnosis not present

## 2023-05-19 DIAGNOSIS — M545 Low back pain, unspecified: Secondary | ICD-10-CM | POA: Diagnosis not present

## 2023-05-19 DIAGNOSIS — E1159 Type 2 diabetes mellitus with other circulatory complications: Secondary | ICD-10-CM | POA: Diagnosis not present

## 2023-05-19 DIAGNOSIS — Z23 Encounter for immunization: Secondary | ICD-10-CM | POA: Diagnosis not present

## 2023-05-19 DIAGNOSIS — Z1331 Encounter for screening for depression: Secondary | ICD-10-CM | POA: Diagnosis not present

## 2023-05-19 DIAGNOSIS — I10 Essential (primary) hypertension: Secondary | ICD-10-CM | POA: Diagnosis not present

## 2023-05-19 DIAGNOSIS — H6123 Impacted cerumen, bilateral: Secondary | ICD-10-CM | POA: Diagnosis not present

## 2023-05-19 DIAGNOSIS — Z6833 Body mass index (BMI) 33.0-33.9, adult: Secondary | ICD-10-CM | POA: Diagnosis not present

## 2023-05-19 DIAGNOSIS — Z0001 Encounter for general adult medical examination with abnormal findings: Secondary | ICD-10-CM | POA: Diagnosis not present

## 2023-05-21 ENCOUNTER — Telehealth: Payer: Self-pay | Admitting: *Deleted

## 2023-05-21 NOTE — Telephone Encounter (Signed)
   Pre-operative Risk Assessment    Patient Name: Tracy Knight  DOB: 09/28/1960 MRN: [id number]  DATE OF LAST VISIT: 04/03/23 DR. CROITORU DATE OF NEXT VISIT: NONE    Request for Surgical Clearance    Procedure:   LEFT TOTAL KNEE ARTHROPLASTY  Date of Surgery:  Clearance 07/23/23                                 Surgeon:  DR/ Durene Romans Surgeon's Group or Practice Name:  [facility] Phone number:  [phone] ATTN: Rosalva Ferron Fax number:  [phone]   Type of Clearance Requested:   - Medical ; NONE INDICATED TO BE HELD   Type of Anesthesia:  Spinal   Additional requests/questions:    Elpidio Anis   05/21/2023, 3:33 PM

## 2023-05-22 DIAGNOSIS — I10 Essential (primary) hypertension: Secondary | ICD-10-CM | POA: Diagnosis not present

## 2023-05-22 DIAGNOSIS — E1159 Type 2 diabetes mellitus with other circulatory complications: Secondary | ICD-10-CM | POA: Diagnosis not present

## 2023-05-25 ENCOUNTER — Encounter: Payer: Self-pay | Admitting: Cardiovascular Disease

## 2023-05-25 ENCOUNTER — Telehealth: Payer: Self-pay | Admitting: *Deleted

## 2023-05-25 NOTE — Telephone Encounter (Signed)
Pt has been scheduled tele pre op appt 07/13/23. Med rec and consent are done.     Patient Consent for Virtual Visit        Tracy Knight has provided verbal consent on 05/25/2023 for a virtual visit (video or telephone).   CONSENT FOR VIRTUAL VISIT FOR:  Tracy Knight  By participating in this virtual visit I agree to the following:  I hereby voluntarily request, consent and authorize [facility] and its employed or contracted physicians, physician assistants, nurse practitioners or other licensed health care professionals (the Practitioner), to provide me with telemedicine health care services (the "Services") as deemed necessary by the treating Practitioner. I acknowledge and consent to receive the Services by the Practitioner via telemedicine. I understand that the telemedicine visit will involve communicating with the Practitioner through live audiovisual communication technology and the disclosure of certain medical information by electronic transmission. I acknowledge that I have been given the opportunity to request an in-person assessment or other available alternative prior to the telemedicine visit and am voluntarily participating in the telemedicine visit.  I understand that I have the right to withhold or withdraw my consent to the use of telemedicine in the course of my care at any time, without affecting my right to future care or treatment, and that the Practitioner or I may terminate the telemedicine visit at any time. I understand that I have the right to inspect all information obtained and/or recorded in the course of the telemedicine visit and may receive copies of available information for a reasonable fee.  I understand that some of the potential risks of receiving the Services via telemedicine include:  Delay or interruption in medical evaluation due to technological equipment failure or disruption; Information transmitted may not be sufficient (e.g. poor resolution  of images) to allow for appropriate medical decision making by the Practitioner; and/or  In rare instances, security protocols could fail, causing a breach of personal health information.  Furthermore, I acknowledge that it is my responsibility to provide information about my medical history, conditions and care that is complete and accurate to the best of my ability. I acknowledge that Practitioner's advice, recommendations, and/or decision may be based on factors not within their control, such as incomplete or inaccurate data provided by me or distortions of diagnostic images or specimens that may result from electronic transmissions. I understand that the practice of medicine is not an exact science and that Practitioner makes no warranties or guarantees regarding treatment outcomes. I acknowledge that a copy of this consent can be made available to me via my patient portal [facility] MyChart), or I can request a printed copy by calling the office of [facility].    I understand that my insurance will be billed for this visit.   I have read or had this consent read to me. I understand the contents of this consent, which adequately explains the benefits and risks of the Services being provided via telemedicine.  I have been provided ample opportunity to ask questions regarding this consent and the Services and have had my questions answered to my satisfaction. I give my informed consent for the services to be provided through the use of telemedicine in my medical care

## 2023-05-25 NOTE — Telephone Encounter (Signed)
Pt has been scheduled tele pre op appt 07/13/23. Med rec and consent are done.

## 2023-05-25 NOTE — Telephone Encounter (Signed)
   Name: Tracy Knight  DOB: 1961-06-10  MRN: [id number]  Primary Cardiologist: Thurmon Fair, MD  Chart reviewed as part of pre-operative protocol coverage. Because of [name]'s past medical history and time since last visit, she will require a follow-up telephone visit in order to better assess preoperative cardiovascular risk.  Pre-op covering staff: - Please schedule appointment and call patient to inform them. If patient already had an upcoming appointment within acceptable timeframe, please add "pre-op clearance" to the appointment notes so provider is aware. - Please contact requesting surgeon's office via preferred method (i.e, phone, fax) to inform them of need for appointment prior to surgery.  No medications indicated as needing held.   Sharlene Dory, PA-C  05/25/2023, 3:30 PM

## 2023-05-26 DIAGNOSIS — M545 Low back pain, unspecified: Secondary | ICD-10-CM | POA: Diagnosis not present

## 2023-05-26 NOTE — Telephone Encounter (Signed)
Left message for pt to call.

## 2023-05-26 NOTE — Telephone Encounter (Signed)
Please Confirm that she is taking diltiazem 360 mg daily and losartan 100 mg daily and triamterene hydrochlorothiazide 37.5/50 once daily. If that is the case, I would suggest switching from losartan to a better angiotensin receptor blocker such as olmesartan 40 mg once daily

## 2023-05-27 MED ORDER — OLMESARTAN MEDOXOMIL 40 MG PO TABS: 40.0000 mg | ORAL_TABLET | Freq: Every day | ORAL | 1 refills | Status: DC

## 2023-05-27 NOTE — Telephone Encounter (Signed)
Patient identification verified by 2 forms. Marilynn Rail, RN    Called and spoke to patient  Patient states:   -Diltiazem 360mg  morning   -Tiamterene/hydrochlorothiazide 37.5/50 in the morning   -losartan 100mg  nightly  Informed patient:   -Per Dr. Royann Shivers replace losartan with olmesartan 40mg    -continue to check BP, write down BP readings fro then next 1-2weeks after starting Olmesartan  -send follow up mychart message to notify provider if BP has improved or no changes  Reviewed RX instruction/education  Patient aware Rx sent to preferred pharmacy  Patient agrees with plan, no questions at this time

## 2023-06-03 DIAGNOSIS — H35463 Secondary vitreoretinal degeneration, bilateral: Secondary | ICD-10-CM | POA: Diagnosis not present

## 2023-06-11 ENCOUNTER — Telehealth: Payer: Self-pay | Admitting: Cardiovascular Disease

## 2023-06-11 NOTE — Telephone Encounter (Signed)
Called and spoke to patient concerning her BP reading. She deny headache, SOB, CP, blurred vision or any other symptoms at this time. She is sending over BP reading for the last 2 weeks.   11/28: 137/77, 146/82 11/29: 155/89, 148/82 11/30: 150/82, 142/75 12/01: 159/89, 154/85 12/02: 158/88, 142/89, 146/77 12/03: 154/62, 157/90 12/04: 171/90, 150/82 12/05: 143/79, 155/81 12/06: 140/85, 152/91 12/07: 154/83, 148/78 12/08: 135/75, 153/84 12/09: 159/92, 138/80 12/10: 143/83 12/11: 152/93, 150/82 12/12:L 146/89

## 2023-06-11 NOTE — Telephone Encounter (Signed)
Pt c/o BP issue: STAT if pt c/o blurred vision, one-sided weakness or slurred speech  1. What are your last 5 BP readings?  11/28: 137/77, 146/82 11/29: 155/89, 148/82 11/30: 150/82, 142/75 12/01: 159/89, 154/85 12/02: 158/88, 142/89, 146/77 12/03: 154/62, 157/90 12/04: 171/90, 150/82 12/05: 143/79, 155/81 12/06: 140/85, 152/91 12/07: 154/83, 148/78 12/08: 135/75, 153/84 12/09: 159/92, 138/80 12/10: 143/83 12/11: 152/93, 150/82 12/12:L 146/89  2. Are you having any other symptoms (ex. Dizziness, headache, blurred vision, passed out)?  Sweats/hot flashes   3. What is your BP issue?   Patient is calling to report BP readings for the past 2 weeks.

## 2023-06-11 NOTE — Telephone Encounter (Signed)
 Called patient with no answer. Left message to return call.

## 2023-06-11 NOTE — Telephone Encounter (Signed)
BP just slightly too high. On highest available tablet sizes for diltiazem and olmesartan. Please switch the triamterene-hydrochlorothiazide dose to the 50/25 mg dose (this contains more triamterene which may also help with her tendency to have low potassium levels).

## 2023-06-12 MED ORDER — HYDROCHLOROTHIAZIDE 25 MG PO TABS: 25.0000 mg | ORAL_TABLET | Freq: Every day | ORAL | 3 refills | Status: AC

## 2023-06-12 MED ORDER — TRIAMTERENE 50 MG PO CAPS: 50.0000 mg | ORAL_CAPSULE | Freq: Every day | ORAL | 3 refills | Status: DC

## 2023-06-12 NOTE — Telephone Encounter (Signed)
Called and spoke to patient.Informed patient that we could not order the Triamterene-hydrochlorothiazide 50/25 mg combination in out system and medications had to be sent separate.Advised her she will be getting 2 medications from the pharmacy instead on 1. I also told patient to stop taking her Diltiazem 360 mg and Olmesartan 40 mg. Patient verbalized understanding and agree.

## 2023-06-12 NOTE — Telephone Encounter (Signed)
Can you clarify dosage and frequency? There is no Maxzide 50/25 mg. The only strength available: (37.5/25 or 75/50.) Please advise.

## 2023-06-12 NOTE — Telephone Encounter (Signed)
Hmmm, that dose exists as a capsule, but Epic does not have it! Will have to break it down to the ingredients. Please Rx separate triamterene 50 mg daily and hydrochlorothiazide 25 mg daily, each #90 and 3RF

## 2023-06-14 DIAGNOSIS — I10 Essential (primary) hypertension: Secondary | ICD-10-CM | POA: Diagnosis not present

## 2023-06-26 DIAGNOSIS — C50912 Malignant neoplasm of unspecified site of left female breast: Secondary | ICD-10-CM | POA: Diagnosis not present

## 2023-07-08 DIAGNOSIS — E785 Hyperlipidemia, unspecified: Secondary | ICD-10-CM | POA: Diagnosis not present

## 2023-07-08 DIAGNOSIS — E1159 Type 2 diabetes mellitus with other circulatory complications: Secondary | ICD-10-CM | POA: Diagnosis not present

## 2023-07-08 DIAGNOSIS — I10 Essential (primary) hypertension: Secondary | ICD-10-CM | POA: Diagnosis not present

## 2023-07-08 DIAGNOSIS — E6609 Other obesity due to excess calories: Secondary | ICD-10-CM | POA: Diagnosis not present

## 2023-07-08 DIAGNOSIS — Z6833 Body mass index (BMI) 33.0-33.9, adult: Secondary | ICD-10-CM | POA: Diagnosis not present

## 2023-07-08 DIAGNOSIS — Z01818 Encounter for other preprocedural examination: Secondary | ICD-10-CM | POA: Diagnosis not present

## 2023-07-10 DIAGNOSIS — M25562 Pain in left knee: Secondary | ICD-10-CM | POA: Diagnosis not present

## 2023-07-10 NOTE — Progress Notes (Signed)
 Virtual Visit via Telephone Note   Because of Tracy Knight's co-morbid illnesses, she is at least at moderate risk for complications without adequate follow up.  This format is felt to be most appropriate for this patient at this time.  The patient did not have access to video technology/had technical difficulties with video requiring transitioning to audio format only (telephone).  All issues noted in this document were discussed and addressed.  No physical exam could be performed with this format.  Please refer to the patient's chart for her consent to telehealth for [facility].  Evaluation Performed:  Preoperative cardiovascular risk assessment _____________   Date:  07/13/2023   Patient ID:  Tracy Knight, DOB 04-16-1961, MRN [id number] Patient Location:  Home Provider location:   Office  Primary Care Provider:  Marvine Rush, MD Primary Cardiologist:  Jerel Balding, MD  Chief Complaint / Patient Profile   63 y.o. y/o adult with a h/o hyperlipidemia, hypertension, palpitations, with other history to include morbid obesity, type 2 diabetes.    Who is pending left total knee arthroplasty by Dr. Donnice Car on 07/23/2023.  And presents today for telephonic preoperative cardiovascular risk assessment.  History of Present Illness    Tracy Knight is a 63 y.o. adult who presents via audio/video conferencing for a telehealth visit today.  Pt was last seen in cardiology clinic on on 04/03/2023 by by Dr. Balding.  At that time Tracy Knight was doing well she was not exercising due to her obesity, blood pressure was elevated and therefore he started her on triamterene , HCTZ.,  diabetes under good control, she was continued on calcium channel blocker for palpitations although she was asymptomatic. The patient is now pending procedure as outlined above. Since her last visit, she BP decreased to 136/76 with medication changes. LEE has improved with medication adjustments.  Past Medical  History    Past Medical History:  Diagnosis Date   Arthritis    Asthma    Cancer (HCC) 11/2018   left breast cancer   Diabetes mellitus without complication (HCC)    Type 2   Dyslipidemia    Dysrhythmia    Palpitations   History of nuclear stress test 02/2009   exercise; normal pattern of perfusion; no significant ischemia demonstrated; low risk scan    Hypertension    Past Surgical History:  Procedure Laterality Date   BREAST RECONSTRUCTION  04/16/2020    Removal left chest tissue expander and placement silicone implant 2. Latissimus dorsi flap to left chest 3. Right breast reduction   BREAST RECONSTRUCTION WITH PLACEMENT OF TISSUE EXPANDER AND ALLODERM Left 02/18/2019   Procedure: LEFT BREAST RECONSTRUCTION WITH PLACEMENT OF TISSUE EXPANDER AND ALLODERM;  Surgeon: Arelia Filippo, MD;  Location: [facility];  Service: Plastics;  Laterality: Left;   BREAST REDUCTION SURGERY Right 04/16/2020   Procedure: RIGHT BREAST REDUCTION;  Surgeon: Arelia Filippo, MD;  Location: [facility] OR;  Service: Plastics;  Laterality: Right;   CARDIAC CATHETERIZATION  07/2003   no evidence of CAD   COLONOSCOPY N/A 02/17/2017   Procedure: COLONOSCOPY;  Surgeon: Shaaron Lamar HERO, MD;  [facility];  Service: Endoscopy;  Laterality: N/A;  10:00am   COLONOSCOPY WITH PROPOFOL  N/A 06/02/2022   Procedure: COLONOSCOPY WITH PROPOFOL ;  Surgeon: Shaaron Lamar HERO, MD;  [facility];  Service: Endoscopy;  Laterality: N/A;  8:15am, asa 2   LATISSIMUS FLAP TO BREAST Left 04/16/2020   Procedure: LEFT LATISSIMUS DORSI FLAP  TO LEFT CHEST;  Surgeon: Arelia Filippo, MD;  Location: [facility];  Service: Plastics;  Laterality: Left;   LUMBAR LAMINECTOMY/DECOMPRESSION MICRODISCECTOMY N/A 02/26/2023   Procedure: LUMBAR TWO TO THREE DECOMPRESSION AND DISCECTOMY;  Surgeon: Burnetta Aures, MD;  Location[facility];  Service: Orthopedics;  Laterality: N/A;  3 C-Bed   MASTECTOMY W/ SENTINEL NODE BIOPSY Left  02/18/2019   Procedure: LEFT MASTECTOMY WITH LEFT AXILLARY SENTINEL LYMPH NODE BIOPSY;  Surgeon: Ethyl Lenis, MD;  Location: [facility];  Service: General;  Laterality: Left;   POLYPECTOMY  02/17/2017   Procedure: POLYPECTOMY;  Surgeon: Shaaron Lamar HERO, MD;  [facility];  Service: Endoscopy;;  colon   removal cyst on chin     REMOVAL OF TISSUE EXPANDER AND PLACEMENT OF IMPLANT Left 04/16/2020   Procedure: REMOVAL OF LEFT CHEST TISSUE EXPANDER AND PLACEMENT OF IMPLANT;  Surgeon: Arelia Filippo, MD;  Location: [facility];  Service: Plastics;  Laterality: Left;   TRANSTHORACIC ECHOCARDIOGRAM  03/2007   EF=>55%; borderline LA enlargement; MV mildly thickened/borderline MVP/mild MR    Allergies  Allergies  Allergen Reactions   Carvedilol Other (See Comments)    Causes inc. In wheezing due to asthma    Home Medications    Prior to Admission medications   Medication Sig Start Date End Date Taking? Authorizing Provider  anastrozole  (ARIMIDEX ) 1 MG tablet TAKE 1 TABLET(1 MG) BY MOUTH DAILY 03/05/23   Gudena, Vinay, MD  CALCIUM-VITAMIN D PO Take 1 tablet by mouth in the morning.    [provider]  cetirizine (ZYRTEC) 10 MG tablet Take 10 mg by mouth in the morning.    [provider]  Cyanocobalamin  (VITAMIN B-12) 2500 MCG SUBL Place 2,500 mcg under the tongue in the morning.    [provider]  gabapentin  (NEURONTIN ) 100 MG capsule Take 100 mg by mouth at bedtime.    [provider]  hydrochlorothiazide  (HYDRODIURIL ) 25 MG tablet Take 1 tablet (25 mg total) by mouth daily. 06/12/23 09/10/23  Croitoru, Mihai, MD  montelukast  (SINGULAIR ) 10 MG tablet Take 10 mg by mouth daily before lunch.     [provider]  pravastatin  (PRAVACHOL ) 40 MG tablet TAKE 1 TABLET(40 MG) BY MOUTH DAILY Patient taking differently: Take 40 mg by mouth at bedtime. 11/18/21   Croitoru, Mihai, MD  PROAIR  HFA 108 (90 Base) MCG/ACT inhaler Inhale 2 puffs  into the lungs every 4 (four) hours as needed for wheezing or shortness of breath.  07/27/17   [provider]  sitaGLIPtin -metformin  (JANUMET ) 50-500 MG tablet Take 1 tablet by mouth 2 (two) times daily with a meal. 01/16/21   [provider]  triamterene  (DYRENIUM ) 50 MG capsule Take 1 capsule (50 mg total) by mouth daily. 06/12/23 06/11/24  Croitoru, Jerel, MD    Physical Exam    Vital Signs:  JAYLIANI WANNER does not have vital signs available for review today.BP 136/78  Given telephonic nature of communication, physical exam is limited. AAOx3. NAD. Normal affect.  Speech and respirations are unlabored.  Accessory Clinical Findings    None  Assessment & Plan    1.  Preoperative Cardiovascular Risk Assessment: According to the Revised Cardiac Risk Index (RCRI), her Perioperative Risk of Major Cardiac Event is (%): 0.9  Her Functional Capacity in METs is: 6.61 according to the Duke Activity Status Index (DASI).   The patient was advised that if she develops new symptoms prior to surgery to contact our office to arrange for a  follow-up visit, and she verbalized understanding.  Per office protocol, if patient is without any new symptoms or concerns at the time of their virtual visit, he/she may hold ASA for 7 days prior to procedure. Please resume ASA as soon as possible postprocedure, at the discretion of the surgeon.     A copy of this note will be routed to requesting surgeon.  Time:   Today, I have spent 10 minutes with the patient with telehealth technology discussing medical history, symptoms, and management plan.     Lamarr Satterfield, NP  07/13/2023, 10:31 AM

## 2023-07-13 ENCOUNTER — Ambulatory Visit: Payer: BC Managed Care – PPO | Attending: Cardiology

## 2023-07-13 DIAGNOSIS — Z79899 Other long term (current) drug therapy: Secondary | ICD-10-CM | POA: Diagnosis not present

## 2023-07-13 DIAGNOSIS — Z01818 Encounter for other preprocedural examination: Secondary | ICD-10-CM

## 2023-07-13 DIAGNOSIS — Z01810 Encounter for preprocedural cardiovascular examination: Secondary | ICD-10-CM | POA: Diagnosis not present

## 2023-07-23 DIAGNOSIS — M1712 Unilateral primary osteoarthritis, left knee: Secondary | ICD-10-CM | POA: Diagnosis not present

## 2023-07-23 DIAGNOSIS — G8918 Other acute postprocedural pain: Secondary | ICD-10-CM | POA: Diagnosis not present

## 2023-07-27 DIAGNOSIS — M25562 Pain in left knee: Secondary | ICD-10-CM | POA: Diagnosis not present

## 2023-07-27 DIAGNOSIS — M25662 Stiffness of left knee, not elsewhere classified: Secondary | ICD-10-CM | POA: Diagnosis not present

## 2023-07-29 DIAGNOSIS — M25662 Stiffness of left knee, not elsewhere classified: Secondary | ICD-10-CM | POA: Diagnosis not present

## 2023-07-29 DIAGNOSIS — M25562 Pain in left knee: Secondary | ICD-10-CM | POA: Diagnosis not present

## 2023-07-31 DIAGNOSIS — M25562 Pain in left knee: Secondary | ICD-10-CM | POA: Diagnosis not present

## 2023-07-31 DIAGNOSIS — M25662 Stiffness of left knee, not elsewhere classified: Secondary | ICD-10-CM | POA: Diagnosis not present

## 2023-08-03 DIAGNOSIS — M25562 Pain in left knee: Secondary | ICD-10-CM | POA: Diagnosis not present

## 2023-08-03 DIAGNOSIS — M25662 Stiffness of left knee, not elsewhere classified: Secondary | ICD-10-CM | POA: Diagnosis not present

## 2023-08-05 DIAGNOSIS — M25562 Pain in left knee: Secondary | ICD-10-CM | POA: Diagnosis not present

## 2023-08-05 DIAGNOSIS — M25662 Stiffness of left knee, not elsewhere classified: Secondary | ICD-10-CM | POA: Diagnosis not present

## 2023-08-07 DIAGNOSIS — M25662 Stiffness of left knee, not elsewhere classified: Secondary | ICD-10-CM | POA: Diagnosis not present

## 2023-08-07 DIAGNOSIS — M25562 Pain in left knee: Secondary | ICD-10-CM | POA: Diagnosis not present

## 2023-08-10 DIAGNOSIS — M25662 Stiffness of left knee, not elsewhere classified: Secondary | ICD-10-CM | POA: Diagnosis not present

## 2023-08-10 DIAGNOSIS — M25562 Pain in left knee: Secondary | ICD-10-CM | POA: Diagnosis not present

## 2023-08-12 DIAGNOSIS — M25662 Stiffness of left knee, not elsewhere classified: Secondary | ICD-10-CM | POA: Diagnosis not present

## 2023-08-12 DIAGNOSIS — M25562 Pain in left knee: Secondary | ICD-10-CM | POA: Diagnosis not present

## 2023-08-14 DIAGNOSIS — M25562 Pain in left knee: Secondary | ICD-10-CM | POA: Diagnosis not present

## 2023-08-14 DIAGNOSIS — M25662 Stiffness of left knee, not elsewhere classified: Secondary | ICD-10-CM | POA: Diagnosis not present

## 2023-08-17 DIAGNOSIS — M25662 Stiffness of left knee, not elsewhere classified: Secondary | ICD-10-CM | POA: Diagnosis not present

## 2023-08-17 DIAGNOSIS — M25562 Pain in left knee: Secondary | ICD-10-CM | POA: Diagnosis not present

## 2023-08-19 DIAGNOSIS — M25662 Stiffness of left knee, not elsewhere classified: Secondary | ICD-10-CM | POA: Diagnosis not present

## 2023-08-19 DIAGNOSIS — M25562 Pain in left knee: Secondary | ICD-10-CM | POA: Diagnosis not present

## 2023-08-21 DIAGNOSIS — M25662 Stiffness of left knee, not elsewhere classified: Secondary | ICD-10-CM | POA: Diagnosis not present

## 2023-08-21 DIAGNOSIS — M25562 Pain in left knee: Secondary | ICD-10-CM | POA: Diagnosis not present

## 2023-08-24 DIAGNOSIS — M25662 Stiffness of left knee, not elsewhere classified: Secondary | ICD-10-CM | POA: Diagnosis not present

## 2023-08-24 DIAGNOSIS — M25562 Pain in left knee: Secondary | ICD-10-CM | POA: Diagnosis not present

## 2023-08-26 DIAGNOSIS — M25562 Pain in left knee: Secondary | ICD-10-CM | POA: Diagnosis not present

## 2023-08-26 DIAGNOSIS — M25662 Stiffness of left knee, not elsewhere classified: Secondary | ICD-10-CM | POA: Diagnosis not present

## 2023-08-28 DIAGNOSIS — M25562 Pain in left knee: Secondary | ICD-10-CM | POA: Diagnosis not present

## 2023-08-28 DIAGNOSIS — M25662 Stiffness of left knee, not elsewhere classified: Secondary | ICD-10-CM | POA: Diagnosis not present

## 2023-08-31 DIAGNOSIS — M25662 Stiffness of left knee, not elsewhere classified: Secondary | ICD-10-CM | POA: Diagnosis not present

## 2023-08-31 DIAGNOSIS — M25562 Pain in left knee: Secondary | ICD-10-CM | POA: Diagnosis not present

## 2023-09-02 DIAGNOSIS — M25662 Stiffness of left knee, not elsewhere classified: Secondary | ICD-10-CM | POA: Diagnosis not present

## 2023-09-02 DIAGNOSIS — M25562 Pain in left knee: Secondary | ICD-10-CM | POA: Diagnosis not present

## 2023-09-04 DIAGNOSIS — M25562 Pain in left knee: Secondary | ICD-10-CM | POA: Diagnosis not present

## 2023-09-04 DIAGNOSIS — M25662 Stiffness of left knee, not elsewhere classified: Secondary | ICD-10-CM | POA: Diagnosis not present

## 2023-09-07 DIAGNOSIS — M25662 Stiffness of left knee, not elsewhere classified: Secondary | ICD-10-CM | POA: Diagnosis not present

## 2023-09-07 DIAGNOSIS — M25562 Pain in left knee: Secondary | ICD-10-CM | POA: Diagnosis not present

## 2023-09-08 DIAGNOSIS — M5416 Radiculopathy, lumbar region: Secondary | ICD-10-CM | POA: Diagnosis not present

## 2023-09-09 DIAGNOSIS — M25662 Stiffness of left knee, not elsewhere classified: Secondary | ICD-10-CM | POA: Diagnosis not present

## 2023-09-09 DIAGNOSIS — M25562 Pain in left knee: Secondary | ICD-10-CM | POA: Diagnosis not present

## 2023-09-11 DIAGNOSIS — M25662 Stiffness of left knee, not elsewhere classified: Secondary | ICD-10-CM | POA: Diagnosis not present

## 2023-09-11 DIAGNOSIS — M25562 Pain in left knee: Secondary | ICD-10-CM | POA: Diagnosis not present

## 2023-09-12 ENCOUNTER — Other Ambulatory Visit: Payer: Self-pay | Admitting: Cardiovascular Disease

## 2023-09-14 DIAGNOSIS — M25562 Pain in left knee: Secondary | ICD-10-CM | POA: Diagnosis not present

## 2023-09-14 DIAGNOSIS — M25662 Stiffness of left knee, not elsewhere classified: Secondary | ICD-10-CM | POA: Diagnosis not present

## 2023-09-16 DIAGNOSIS — Z96652 Presence of left artificial knee joint: Secondary | ICD-10-CM | POA: Diagnosis not present

## 2023-09-16 DIAGNOSIS — M1711 Unilateral primary osteoarthritis, right knee: Secondary | ICD-10-CM | POA: Diagnosis not present

## 2023-09-16 DIAGNOSIS — Z471 Aftercare following joint replacement surgery: Secondary | ICD-10-CM | POA: Diagnosis not present

## 2023-11-17 DIAGNOSIS — E1159 Type 2 diabetes mellitus with other circulatory complications: Secondary | ICD-10-CM | POA: Diagnosis not present

## 2023-11-17 DIAGNOSIS — Z6832 Body mass index (BMI) 32.0-32.9, adult: Secondary | ICD-10-CM | POA: Diagnosis not present

## 2023-11-17 DIAGNOSIS — I10 Essential (primary) hypertension: Secondary | ICD-10-CM | POA: Diagnosis not present

## 2023-11-17 DIAGNOSIS — E1165 Type 2 diabetes mellitus with hyperglycemia: Secondary | ICD-10-CM | POA: Diagnosis not present

## 2023-11-17 DIAGNOSIS — E6609 Other obesity due to excess calories: Secondary | ICD-10-CM | POA: Diagnosis not present

## 2023-11-17 DIAGNOSIS — E785 Hyperlipidemia, unspecified: Secondary | ICD-10-CM | POA: Diagnosis not present

## 2023-11-19 ENCOUNTER — Encounter: Payer: Self-pay | Admitting: Obstetrics & Gynecology

## 2023-11-19 ENCOUNTER — Other Ambulatory Visit (HOSPITAL_COMMUNITY)
Admission: RE | Admit: 2023-11-19 | Discharge: 2023-11-19 | Disposition: A | Discharge status: Home / Self Care | Source: Ambulatory Visit | Attending: Obstetrics & Gynecology | Admitting: Obstetrics & Gynecology

## 2023-11-19 ENCOUNTER — Ambulatory Visit: Admitting: Obstetrics & Gynecology

## 2023-11-19 VITALS — BP 138/78 | HR 74 | Ht 65.0 in | Wt 195.0 lb

## 2023-11-19 DIAGNOSIS — Z853 Personal history of malignant neoplasm of breast: Secondary | ICD-10-CM | POA: Diagnosis not present

## 2023-11-19 DIAGNOSIS — Z01419 Encounter for gynecological examination (general) (routine) without abnormal findings: Secondary | ICD-10-CM | POA: Insufficient documentation

## 2023-11-19 DIAGNOSIS — Z1151 Encounter for screening for human papillomavirus (HPV): Secondary | ICD-10-CM

## 2023-11-19 NOTE — Progress Notes (Signed)
 Subjective:     Tracy Knight is a 63 y.o. female here for a routine exam.  No LMP recorded. Patient is postmenopausal. G3P3 Birth Control Method: Menopausal Menstrual Calendar(currently): Amenorrhea Current complaints: None.   Current acute medical issues: History of breast cancer, recent left knee replacement waiting to get the right one done   Recent Gynecologic History No LMP recorded. Patient is postmenopausal. Last Pap: 2022, normal Last mammogram: 12/2022, normal  Past Medical History:  Diagnosis Date   Arthritis    Asthma    Cancer (HCC) 11/2018   left breast cancer   Diabetes mellitus without complication (HCC)    Type 2   Dyslipidemia    Dysrhythmia    Palpitations   History of nuclear stress test 02/2009   exercise; normal pattern of perfusion; no significant ischemia demonstrated; low risk scan    Hypertension     Past Surgical History:  Procedure Laterality Date   BREAST RECONSTRUCTION  04/16/2020    Removal left chest tissue expander and placement silicone implant 2. Latissimus dorsi flap to left chest 3. Right breast reduction   BREAST RECONSTRUCTION WITH PLACEMENT OF TISSUE EXPANDER AND ALLODERM Left 02/18/2019   Procedure: LEFT BREAST RECONSTRUCTION WITH PLACEMENT OF TISSUE EXPANDER AND ALLODERM;  Surgeon: Alger Infield, MD;  Location: [facility];  Service: Plastics;  Laterality: Left;   BREAST REDUCTION SURGERY Right 04/16/2020   Procedure: RIGHT BREAST REDUCTION;  Surgeon: Alger Infield, MD;  Location: [facility];  Service: Plastics;  Laterality: Right;   CARDIAC CATHETERIZATION  07/2003   no evidence of CAD   COLONOSCOPY N/A 02/17/2017   Procedure: COLONOSCOPY;  Surgeon: Suzette Espy, MD;  [facility];  Service: Endoscopy;  Laterality: N/A;  10:00am   COLONOSCOPY WITH PROPOFOL  N/A 06/02/2022   Procedure: COLONOSCOPY WITH PROPOFOL ;  Surgeon: Suzette Espy, MD;  [facility];  Service: Endoscopy;  Laterality: N/A;   8:15am, asa 2   LATISSIMUS FLAP TO BREAST Left 04/16/2020   Procedure: LEFT LATISSIMUS DORSI FLAP TO LEFT CHEST;  Surgeon: Alger Infield, MD;  Location: [facility];  Service: Plastics;  Laterality: Left;   LUMBAR LAMINECTOMY/DECOMPRESSION MICRODISCECTOMY N/A 02/26/2023   Procedure: LUMBAR TWO TO THREE DECOMPRESSION AND DISCECTOMY;  Surgeon: Mort Ards, MD;  [facility];  Service: Orthopedics;  Laterality: N/A;  3 C-Bed   MASTECTOMY W/ SENTINEL NODE BIOPSY Left 02/18/2019   Procedure: LEFT MASTECTOMY WITH LEFT AXILLARY SENTINEL LYMPH NODE BIOPSY;  Surgeon: Juanita Norlander, MD;  Location: [facility];  Service: General;  Laterality: Left;   POLYPECTOMY  02/17/2017   Procedure: POLYPECTOMY;  Surgeon: Suzette Espy, MD;  [facility];  Service: Endoscopy;;  colon   removal cyst on chin     REMOVAL OF TISSUE EXPANDER AND PLACEMENT OF IMPLANT Left 04/16/2020   Procedure: REMOVAL OF LEFT CHEST TISSUE EXPANDER AND PLACEMENT OF IMPLANT;  Surgeon: Alger Infield, MD;  Location: [facility];  Service: Plastics;  Laterality: Left;   TRANSTHORACIC ECHOCARDIOGRAM  03/2007   EF=>55%; borderline LA enlargement; MV mildly thickened/borderline MVP/mild MR    OB History     Gravida  3   Para  3   Term      Preterm      AB      Living  3      SAB      IAB      Ectopic      Multiple  Live Births              Social History   Socioeconomic History   Marital status: Married    Spouse name: Not on file   Number of children: Not on file   Years of education: Not on file   Highest education level: Not on file  Occupational History   Not on file  Tobacco Use   Smoking status: Never   Smokeless tobacco: Never   Tobacco comments:    never smoke.  Vaping Use   Vaping status: Never Used  Substance and Sexual Activity   Alcohol use: No   Drug use: No   Sexual activity: Yes    Birth control/protection: Post-menopausal  Other Topics Concern   Not on  file  Social History Narrative   Not on file   Social Drivers of Health   Financial Resource Strain: Low Risk  (09/27/2020)   Overall Financial Resource Strain (CARDIA)    Difficulty of Paying Living Expenses: Not hard at all  Food Insecurity: No Food Insecurity (09/27/2020)   Hunger Vital Sign    Worried About Running Out of Food in the Last Year: Never true    Ran Out of Food in the Last Year: Never true  Transportation Needs: No Transportation Needs (09/27/2020)   PRAPARE - Administrator, Civil Service (Medical): No    Lack of Transportation (Non-Medical): No  Physical Activity: Insufficiently Active (09/27/2020)   Exercise Vital Sign    Days of Exercise per Week: 3 days    Minutes of Exercise per Session: 20 min  Stress: No Stress Concern Present (09/27/2020)   [employer] of Occupational Health - Occupational Stress Questionnaire    Feeling of Stress : Not at all  Social Connections: Moderately Integrated (09/27/2020)   Social Connection and Isolation Panel [NHANES]    Frequency of Communication with Friends and Family: More than three times a week    Frequency of Social Gatherings with Friends and Family: Once a week    Attends Religious Services: More than 4 times per year    Active Member of [employer] or Organizations: No    Attends Engineer, structural: Never    Marital Status: Married    Family History  Problem Relation Age of Onset   Heart disease Mother    Heart disease Father    Hypertension Sister    Hypertension Daughter    Diabetes Daughter      Current Outpatient Medications:    anastrozole  (ARIMIDEX ) 1 MG tablet, TAKE 1 TABLET(1 MG) BY MOUTH DAILY, Disp: 90 tablet, Rfl: 3   CALCIUM-VITAMIN D PO, Take 1 tablet by mouth in the morning., Disp: , Rfl:    cetirizine (ZYRTEC) 10 MG tablet, Take 10 mg by mouth in the morning., Disp: , Rfl:    Cyanocobalamin  (VITAMIN B-12) 2500 MCG SUBL, Place 2,500 mcg under the tongue in the morning., Disp:  , Rfl:    hydrochlorothiazide  (HYDRODIURIL ) 25 MG tablet, Take 1 tablet (25 mg total) by mouth daily., Disp: 90 tablet, Rfl: 3   montelukast  (SINGULAIR ) 10 MG tablet, Take 10 mg by mouth daily before lunch. , Disp: , Rfl:    pravastatin  (PRAVACHOL ) 40 MG tablet, TAKE 1 TABLET(40 MG) BY MOUTH DAILY (Patient taking differently: Take 40 mg by mouth at bedtime.), Disp: 90 tablet, Rfl: 3   PROAIR  HFA 108 (90 Base) MCG/ACT inhaler, Inhale 2 puffs into the lungs every 4 (four) hours as needed for wheezing  or shortness of breath. , Disp: , Rfl: 2   sitaGLIPtin -metformin  (JANUMET ) 50-500 MG tablet, Take 1 tablet by mouth 2 (two) times daily with a meal., Disp: , Rfl:    triamterene  (DYRENIUM ) 50 MG capsule, TAKE 1 CAPSULE(50 MG) BY MOUTH DAILY, Disp: 90 capsule, Rfl: 3  Review of Systems  Review of Systems  Constitutional: Negative for fever, chills, weight loss, malaise/fatigue and diaphoresis.  HENT: Negative for hearing loss, ear pain, nosebleeds, congestion, sore throat, neck pain, tinnitus and ear discharge.   Eyes: Negative for blurred vision, double vision, photophobia, pain, discharge and redness.  Respiratory: Negative for cough, hemoptysis, sputum production, shortness of breath, wheezing and stridor.   Cardiovascular: Negative for chest pain, palpitations, orthopnea, claudication, leg swelling and PND.  Gastrointestinal: negative for abdominal pain. Negative for heartburn, nausea, vomiting, diarrhea, constipation, blood in stool and melena.  Genitourinary: Negative for dysuria, urgency, frequency, hematuria and flank pain.  Musculoskeletal: Negative for myalgias, back pain, joint pain and falls.  Skin: Negative for itching and rash.  Neurological: Negative for dizziness, tingling, tremors, sensory change, speech change, focal weakness, seizures, loss of consciousness, weakness and headaches.  Endo/Heme/Allergies: Negative for environmental allergies and polydipsia. Does not bruise/bleed  easily.  Psychiatric/Behavioral: Negative for depression, suicidal ideas, hallucinations, memory loss and substance abuse. The patient is not nervous/anxious and does not have insomnia.        Objective:  Blood pressure 138/78, pulse 74, height 5\' 5"  (1.651 m), weight 195 lb (88.5 kg).   Physical Exam  Vitals reviewed. Constitutional: She is oriented to person, place, and time. She appears well-developed and well-nourished.  HENT:  Head: Normocephalic and atraumatic.        Right Ear: External ear normal.  Left Ear: External ear normal.  Nose: Nose normal.  Mouth/Throat: Oropharynx is clear and moist.  Eyes: Conjunctivae and EOM are normal. Pupils are equal, round, and reactive to light. Right eye exhibits no discharge. Left eye exhibits no discharge. No scleral icterus.  Neck: Normal range of motion. Neck supple. No tracheal deviation present. No thyromegaly present.  Cardiovascular: Normal rate, regular rhythm, normal heart sounds and intact distal pulses.  Exam reveals no gallop and no friction rub.   No murmur heard. Respiratory: Effort normal and breath sounds normal. No respiratory distress. She has no wheezes. She has no rales. She exhibits no tenderness.  GI: Soft. Bowel sounds are normal. She exhibits no distension and no mass. There is no tenderness. There is no rebound and no guarding.  Genitourinary:  Breasts no masses skin changes or nipple changes bilaterally      Vulva is normal without lesions Vagina is pink moist without discharge Cervix normal in appearance and pap is done Uterus is normal size shape and contour Adnexa is negative with normal sized ovaries   Musculoskeletal: Normal range of motion. She exhibits no edema and no tenderness.  Neurological: She is alert and oriented to person, place, and time. She has normal reflexes. She displays normal reflexes. No cranial nerve deficit. She exhibits normal muscle tone. Coordination normal.  Skin: Skin is warm and dry.  No rash noted. No erythema. No pallor.  Psychiatric: She has a normal mood and affect. Her behavior is normal. Judgment and thought content normal.       Medications Ordered at today's visit: No orders of the defined types were placed in this encounter.   Other orders placed at today's visit: No orders of the defined types were placed in this encounter.  ASSESSMENT + PLAN:    ICD-10-CM   1. Well woman exam with routine gynecological exam  Z01.419     2. Encounter for gynecological examination with Papanicolaou smear of cervix  Z01.419 Cytology - PAP( [place])    3. History of left breast cancer  Z85.3           No follow-ups on file.

## 2023-11-25 LAB — CYTOLOGY - PAP
Comment: NEGATIVE
Diagnosis: UNDETERMINED — AB
High risk HPV: NEGATIVE

## 2023-11-26 ENCOUNTER — Ambulatory Visit: Payer: Self-pay | Admitting: Obstetrics & Gynecology

## 2023-11-27 ENCOUNTER — Other Ambulatory Visit: Payer: Self-pay | Admitting: Cardiovascular Disease

## 2023-11-28 ENCOUNTER — Other Ambulatory Visit: Payer: Self-pay | Admitting: Cardiovascular Disease

## 2023-12-02 ENCOUNTER — Telehealth: Payer: Self-pay | Admitting: *Deleted

## 2023-12-02 ENCOUNTER — Telehealth: Payer: Self-pay | Admitting: Cardiovascular Disease

## 2023-12-02 MED ORDER — OLMESARTAN MEDOXOMIL 40 MG PO TABS: 40.0000 mg | ORAL_TABLET | Freq: Every day | ORAL | 3 refills | Status: AC

## 2023-12-02 MED ORDER — DILTIAZEM HCL ER COATED BEADS 360 MG PO CP24: 360.0000 mg | ORAL_CAPSULE | Freq: Every day | ORAL | 3 refills | Status: DC

## 2023-12-02 NOTE — Telephone Encounter (Signed)
 Called and left patient a message to call office to verify which medications need refilling. Medications that patient calling in to be refilled not found on medication list. This will be sent tp provider for advice.

## 2023-12-02 NOTE — Telephone Encounter (Signed)
 Please refill the olmesartan . We made changes to her triamterene  and hydrochlorothiazide  on 06/12/2023 and it looks like Tracy Lulas, RN told her to stop olmesartan  40 mg daily and canceled the prescription at that time, although those were not the instructions I had given. Same thing applies to the diltiazem . Please ask Tracy Knight if she is still taking diltiazem  360 mg daily. If so, that should also be added back to her med list and refilled.    Spoke with the patient- gave information above- pt reports that she just ran out of the Olmesartan  and she is still taking the diltiazem .  RX sent for both of these medications  Appt made for 02/18/24 for yearly and Pre-op to see Dr Alvis Ba

## 2023-12-02 NOTE — Telephone Encounter (Signed)
   Pre-operative Risk Assessment    Patient Name: Tracy Knight  DOB: 08/28/1960 MRN: [id number]   Date of last office visit: 07/13/23 [name], DNP Date of next office visit: NONE   Request for Surgical Clearance    Procedure:  RIGHT TOTAL KNEE ARTHROPLASTY  Date of Surgery:  Clearance 03/14/24                                Surgeon:  DR. MATTEW OLIN Surgeon's Group or Practice Name:  [facility] Phone number:  [phone] [name]  Fax number:  [phone]   Type of Clearance Requested:   - Medical ; NONE INDICATED TO BE HELD   Type of Anesthesia:  Spinal   Additional requests/questions:    [name]   12/02/2023, 9:44 AM

## 2023-12-02 NOTE — Telephone Encounter (Signed)
 Contacted the patient but a gentleman answered and said she was not home. Will try again later.

## 2023-12-02 NOTE — Telephone Encounter (Signed)
 Pt c/o medication issue:  1. Name of Medication:   olmesartan  (BENICAR ) 40 MG tablet [[id number]]   2. How are you currently taking this medication (dosage and times per day)?   As prescribed  3. Are you having a reaction (difficulty breathing--STAT)?   4. What is your medication issue?   Patient stated she has continued to take this medication and is now running out.  Patient wants to know if she should still be taking this medication and she will need to get a refill sent to [id number] - [place], [place] - [address]

## 2023-12-02 NOTE — Telephone Encounter (Signed)
 Please refill the olmesartan . We made changes to her triamterene  and hydrochlorothiazide  on 06/12/2023 and it looks like Hobart Lulas, RN told her to stop olmesartan  40 mg daily and canceled the prescription at that time, although those were not the instructions I had given. Same thing applies to the diltiazem . Please ask Tracy Knight if she is still taking diltiazem  360 mg daily. If so, that should also be added back to her med list and refilled.

## 2023-12-02 NOTE — Telephone Encounter (Signed)
   Name: Tracy Knight  DOB: 12/28/1960  MRN: [id number]  Primary Cardiologist: Luana Rumple, MD   Preoperative team, please contact this patient and set up a phone call appointment for further preoperative risk assessment. Please obtain consent and complete medication review. Thank you for your help.  I confirm that guidance regarding antiplatelet and oral anticoagulation therapy has been completed and, if necessary, noted below.  None  I also confirmed the patient resides in the state of [place] . As per [facility] Medical Board telemedicine laws, the patient must reside in the state in which the provider is licensed.   Francene Ing, Retha Cast, NP 12/02/2023, 9:58 AM [facility]

## 2023-12-03 NOTE — Telephone Encounter (Signed)
 I will update the chart the pt has in office appt with Dr. Alvis Ba 02/18/24, procedure date is 03/14/24. I will review with preop APP the the pt made an in office appt instead of a tele appt.

## 2023-12-04 NOTE — Telephone Encounter (Signed)
   Name: Tracy Knight  DOB: 01-21-1961  MRN: [id number]  Primary Cardiologist: Luana Rumple, MD  Chart reviewed as part of pre-operative protocol coverage. The patient has an upcoming visit scheduled with Dr. Alvis Ba on 02/18/2024 at which time clearance can be addressed in case there are any issues that would impact surgical recommendations.  Right total knee arthroplasty is not scheduled until 03/14/2024 as below. I added preop FYI to appointment note so that provider is aware to address at time of outpatient visit.  Per office protocol the cardiology provider should forward their finalized clearance decision and recommendations regarding antiplatelet therapy to the requesting party below.    I will route this message as FYI to requesting party and remove this message from the preop box as separate preop APP input not needed at this time.   Please call with any questions.  Ava Boatman, NP  12/04/2023, 7:52 AM

## 2023-12-21 ENCOUNTER — Other Ambulatory Visit (HOSPITAL_COMMUNITY): Payer: Self-pay

## 2023-12-21 ENCOUNTER — Telehealth: Payer: Self-pay | Admitting: Pharmacy Technician

## 2023-12-21 NOTE — Telephone Encounter (Signed)
 Pharmacy Patient Advocate Encounter   Received notification from CoverMyMeds that prior authorization for diltiazem  360mg  is required/requested.   Insurance verification completed.   The patient is insured through [employer] .   Per test claim: PA required; PA submitted to above mentioned insurance via CoverMyMeds Key/confirmation #/EOC [id number] Status is pending   If this is denied, per test claim insurance will pay for diltiazem  180mg  at 2 capsules a day at 0.00 copay to equal the 360mg .

## 2023-12-23 MED ORDER — DILTIAZEM HCL ER BEADS 180 MG PO CP24: 360.0000 mg | ORAL_CAPSULE | Freq: Every day | ORAL | 3 refills | Status: DC

## 2023-12-23 NOTE — Telephone Encounter (Signed)
 Will send this message to Dr. Francyne and covering RN, to make them of PA denial for Diltiazem  ER Coated Beads 360 mg capsules, per our prior auth team.

## 2023-12-23 NOTE — Telephone Encounter (Signed)
 Please change to diltiazem  180 mg x 2 instead

## 2023-12-23 NOTE — Telephone Encounter (Signed)
 diltiazem  (TIAZAC ) 180 MG 24 hr capsule take 2 capsules a day 360 mg, Daily   RX has been sent to pt's pharmacy.   Called and left message on pt's home answering machine that a new RX was sent in due to insurance coverage- She will take 2 capsules of the Diltiazem  180 mg to equal out to current dosage of 360 mg daily.  Left call back number for questions/concerns and she may also speak with the pharmacy.

## 2023-12-23 NOTE — Telephone Encounter (Signed)
 Pharmacy Patient Advocate Encounter  Received notification from [employer] that Prior Authorization for diltIAZem  HCl ER Coated Beads 360MG  er capsules has been DENIED.  Full denial letter will be uploaded to the media tab. See denial reason below. HOWEVER, per test claim insurance will pay for diltiazem  180mg  at 2 capsules a day at 0.00 copay to equal the 360mg . Can this be changed to that?

## 2023-12-23 NOTE — Addendum Note (Signed)
 Addended by: Lauramae Kneisley L on: 12/23/2023 03:07 PM   Modules accepted: Orders

## 2024-01-07 ENCOUNTER — Other Ambulatory Visit (HOSPITAL_COMMUNITY): Payer: Self-pay | Admitting: Family Medicine

## 2024-01-07 DIAGNOSIS — Z1231 Encounter for screening mammogram for malignant neoplasm of breast: Secondary | ICD-10-CM

## 2024-01-11 DIAGNOSIS — R6889 Other general symptoms and signs: Secondary | ICD-10-CM | POA: Diagnosis not present

## 2024-01-11 DIAGNOSIS — Z20828 Contact with and (suspected) exposure to other viral communicable diseases: Secondary | ICD-10-CM | POA: Diagnosis not present

## 2024-01-11 DIAGNOSIS — J4520 Mild intermittent asthma, uncomplicated: Secondary | ICD-10-CM | POA: Diagnosis not present

## 2024-01-11 DIAGNOSIS — Z6832 Body mass index (BMI) 32.0-32.9, adult: Secondary | ICD-10-CM | POA: Diagnosis not present

## 2024-01-11 DIAGNOSIS — J069 Acute upper respiratory infection, unspecified: Secondary | ICD-10-CM | POA: Diagnosis not present

## 2024-01-11 DIAGNOSIS — E6609 Other obesity due to excess calories: Secondary | ICD-10-CM | POA: Diagnosis not present

## 2024-01-12 ENCOUNTER — Inpatient Hospital Stay: Payer: BC Managed Care – PPO | Attending: Hematology and Oncology | Admitting: Hematology and Oncology

## 2024-01-12 VITALS — BP 174/81 | HR 84 | Temp 98.1°F | Resp 18 | Ht 65.0 in | Wt 192.9 lb

## 2024-01-12 DIAGNOSIS — Z1721 Progesterone receptor positive status: Secondary | ICD-10-CM | POA: Diagnosis not present

## 2024-01-12 DIAGNOSIS — Z923 Personal history of irradiation: Secondary | ICD-10-CM | POA: Diagnosis not present

## 2024-01-12 DIAGNOSIS — Z170 Estrogen receptor positive status [ER+]: Secondary | ICD-10-CM | POA: Insufficient documentation

## 2024-01-12 DIAGNOSIS — M8580 Other specified disorders of bone density and structure, unspecified site: Secondary | ICD-10-CM | POA: Diagnosis not present

## 2024-01-12 DIAGNOSIS — Z9012 Acquired absence of left breast and nipple: Secondary | ICD-10-CM | POA: Insufficient documentation

## 2024-01-12 DIAGNOSIS — Z79811 Long term (current) use of aromatase inhibitors: Secondary | ICD-10-CM | POA: Insufficient documentation

## 2024-01-12 DIAGNOSIS — J45901 Unspecified asthma with (acute) exacerbation: Secondary | ICD-10-CM | POA: Diagnosis not present

## 2024-01-12 DIAGNOSIS — Z1732 Human epidermal growth factor receptor 2 negative status: Secondary | ICD-10-CM | POA: Diagnosis not present

## 2024-01-12 DIAGNOSIS — C50812 Malignant neoplasm of overlapping sites of left female breast: Secondary | ICD-10-CM | POA: Insufficient documentation

## 2024-01-12 MED ORDER — ALENDRONATE SODIUM 70 MG PO TABS: 70.0000 mg | ORAL_TABLET | ORAL | 3 refills | Status: AC

## 2024-01-12 MED ORDER — ANASTROZOLE 1 MG PO TABS: ORAL_TABLET | ORAL | 3 refills | Status: AC

## 2024-01-12 NOTE — Assessment & Plan Note (Signed)
 12/17/2018:Screening mammogram detected 1.5cm left breast mass, left breast distortion, and cortical thickening in left axillary lymph node. Biopsy confirmed invasive mammary carcinoma with mammary carcinoma in situ, grade 2-3, HER2- (1+), ER+ (100%), PR+ (100%), Ki67 10%. No evidence of carcinoma in the left axillary lymph node.  T1CN0 stage Ia   Treatment summary: 1.  02/17/2019: Left mastectomy and sentinel lymph node biopsy Jeoffrey, Thimmappa): invasive lobular carcinoma, grade 2, 1.6cm, clear margins. One lymph node positive for metastatic carcinoma, 0.5cm, with extranodal extension. 1/5 LN 2.  MammaPrint testing: Low risk 3. Adjuvant radiation therapy because of lymph node positivity 04/20/2019-06/06/2019 4. Adjuvant antiestrogen therapy with anastrozole  started August 2020 -----------------------------------------------------------------   Current treatment: Adjuvant antiestrogen therapy with anastrozole .   Anastrozole  toxicities: Slight joint stiffness in the knees.   Breast cancer surveillance: 1. Mammogram scheduled for 01/20/2024 2. breast exam 01/12/2024: Benign   Bone density 12/29/2019: T score -1.2: Mild osteopenia We will obtain another bone density test before her next visit in 1 year   Arthritis: Planning to undergo left knee replacement surgery We will look at the bone density test to make a final decision on duration of antiestrogen therapy. Return to clinic in 1 year for follow-up

## 2024-01-12 NOTE — Progress Notes (Signed)
 Patient Care Team: Marvine Rush, MD as PCP - General (Family Medicine) Francyne Headland, MD as PCP - Cardiology (Cardiology) Ethyl Lenis, MD as Consulting Physician (General Surgery) Odean Potts, MD as Consulting Physician (Hematology and Oncology) Dewey Rush, MD as Consulting Physician (Radiation Oncology) Arelia Filippo, MD as Consulting Physician (Plastic Surgery)  DIAGNOSIS:  Encounter Diagnosis  Name Primary?   Malignant neoplasm of overlapping sites of left breast in female, estrogen receptor positive (HCC) Yes    SUMMARY OF ONCOLOGIC HISTORY: Oncology History  Malignant neoplasm of overlapping sites of left breast in female, estrogen receptor positive (HCC)  12/17/2018 Initial Diagnosis   Screening mammogram detected 1.5cm left breast mass, left breast distortion, and cortical thickening in left axillary lymph node. Biopsy confirmed invasive mammary carcinoma with mammary carcinoma in situ, grade 2-3, HER2- (1+), ER+ (100%), PR+ (100%), Ki67 10%. No evidence of carcinoma in the left axillary lymph node.    12/22/2018 Cancer Staging   Staging form: Breast, AJCC 8th Edition - Clinical stage from 12/22/2018: Stage IA (cT1c, cN0, cM0, G3, ER+, PR+, HER2-)    11/2018 - 11/2023 Anti-estrogen oral therapy   Anastrozole    02/18/2019 Surgery   Left mastectomy and sentinel lymph node biopsy Jeoffrey, Thimmappa) [phone]): invasive lobular carcinoma, grade 2, 1.6cm, clear margins. One out of five lymph nodes positive for metastatic carcinoma, 0.5cm, with extranodal extension.    02/25/2019 Cancer Staging   Staging form: Breast, AJCC 8th Edition - Pathologic: Stage IA (pT1c, pN1a, cM0, G2, ER+, PR+, HER2-)     03/11/2019 Oncotype testing   Mammaprint: low risk, 10% of recurrence in 10 years without systemic therapy   04/20/2019 - 06/06/2019 Radiation Therapy   The patient initially received a dose of 50.4 Gy in 28 fractions to the chest wall and supraclavicular region. This  was delivered using a 3-D conformal, 4 field technique. The patient then received a boost to the mastectomy scar. This delivered an additional 10 Gy in 5 fractions using an en face electron field. The total dose was 60.4 Gy.     CHIEF COMPLIANT:   HISTORY OF PRESENT ILLNESS: Discussed the use of AI scribe software for clinical note transcription with the patient, who gave verbal consent to proceed.  History of Present Illness Tracy Knight is a 63 year old female with breast cancer on anastrozole  therapy who presents for follow-up regarding bone density changes.  She has been on anastrozole  therapy for five years, during which her bone density has decreased from a T-score of -1.2 to -2.1. She is currently taking calcium and vitamin D supplements. An upcoming mammogram is scheduled for next week.     ALLERGIES:  is allergic to carvedilol.  MEDICATIONS:  Current Outpatient Medications  Medication Sig Dispense Refill   amoxicillin-clavulanate (AUGMENTIN) 875-125 MG tablet Take 1 tablet by mouth 2 (two) times daily.     anastrozole  (ARIMIDEX ) 1 MG tablet TAKE 1 TABLET(1 MG) BY MOUTH DAILY 90 tablet 3   benzonatate (TESSALON) 200 MG capsule Take by mouth.     CALCIUM-VITAMIN D PO Take 1 tablet by mouth in the morning.     cetirizine (ZYRTEC) 10 MG tablet Take 10 mg by mouth in the morning.     chlorpheniramine-HYDROcodone (TUSSIONEX) 10-8 MG/5ML Take 5 mLs by mouth 2 (two) times daily as needed.     Cyanocobalamin  (VITAMIN B-12) 2500 MCG SUBL Place 2,500 mcg under the tongue in the morning.     diltiazem  (TIAZAC ) 180 MG 24 hr capsule Take  2 capsules (360 mg total) by mouth daily. 180 capsule 3   hydrochlorothiazide  (HYDRODIURIL ) 25 MG tablet Take 1 tablet (25 mg total) by mouth daily. 90 tablet 3   montelukast  (SINGULAIR ) 10 MG tablet Take 10 mg by mouth daily before lunch.      olmesartan  (BENICAR ) 40 MG tablet Take 1 tablet (40 mg total) by mouth daily. 90 tablet 3   pravastatin   (PRAVACHOL ) 40 MG tablet TAKE 1 TABLET(40 MG) BY MOUTH DAILY 90 tablet 3   PROAIR  HFA 108 (90 Base) MCG/ACT inhaler Inhale 2 puffs into the lungs every 4 (four) hours as needed for wheezing or shortness of breath.   2   sitaGLIPtin -metformin  (JANUMET ) 50-500 MG tablet Take 1 tablet by mouth 2 (two) times daily with a meal.     triamterene  (DYRENIUM ) 50 MG capsule TAKE 1 CAPSULE(50 MG) BY MOUTH DAILY 90 capsule 3   No current facility-administered medications for this visit.    PHYSICAL EXAMINATION: ECOG PERFORMANCE STATUS: 1 - Symptomatic but completely ambulatory  Vitals:   01/12/24 1016  BP: (!) 174/81  Pulse: 84  Resp: 18  Temp: 98.1 F (36.7 C)  SpO2: 100%   Filed Weights   01/12/24 1016  Weight: 192 lb 14.4 oz (87.5 kg)    Physical Exam   (exam performed in the presence of a chaperone)  LABORATORY DATA:  I have reviewed the data as listed    Latest Ref Rng & Units 02/16/2023   11:53 AM 05/26/2022   12:20 PM 04/12/2020   11:00 AM  CMP  Glucose 70 - 99 mg/dL 859  861  851   BUN 8 - 23 mg/dL 20  21  18    Creatinine 0.44 - 1.00 mg/dL 9.06  9.13  9.13   Sodium 135 - 145 mmol/L 140  139  140   Potassium 3.5 - 5.1 mmol/L 3.4  3.4  3.4   Chloride 98 - 111 mmol/L 100  100  100   CO2 22 - 32 mmol/L 27  29  27    Calcium 8.9 - 10.3 mg/dL 9.8  89.8  89.3     Lab Results  Component Value Date   WBC 5.8 02/16/2023   HGB 11.6 (L) 02/16/2023   HCT 36.8 02/16/2023   MCV 85.0 02/16/2023   PLT 233 02/16/2023   NEUTROABS 4.0 12/22/2018    ASSESSMENT & PLAN:  Malignant neoplasm of overlapping sites of left breast in female, estrogen receptor positive (HCC) 12/17/2018:Screening mammogram detected 1.5cm left breast mass, left breast distortion, and cortical thickening in left axillary lymph node. Biopsy confirmed invasive mammary carcinoma with mammary carcinoma in situ, grade 2-3, HER2- (1+), ER+ (100%), PR+ (100%), Ki67 10%. No evidence of carcinoma in the left axillary lymph  node.  T1CN0 stage Ia   Treatment summary: 1.  02/17/2019: Left mastectomy and sentinel lymph node biopsy Jeoffrey, Thimmappa): invasive lobular carcinoma, grade 2, 1.6cm, clear margins. One lymph node positive for metastatic carcinoma, 0.5cm, with extranodal extension. 1/5 LN 2.  MammaPrint testing: Low risk 3. Adjuvant radiation therapy because of lymph node positivity 04/20/2019-06/06/2019 4. Adjuvant antiestrogen therapy with anastrozole  started August 2020 -----------------------------------------------------------------   Current treatment: Adjuvant antiestrogen therapy with anastrozole .   Anastrozole  toxicities:  Slight joint stiffness in the knees. Osteopenia: Bone density worsened from -1.2 to -2.1   We will continue antiestrogen therapy for a total of 7 years.  Breast cancer surveillance: 1. Mammogram scheduled for 01/20/2024 2. breast exam 01/12/2024: Benign   Bone density  05/11/2023: T score -2.1:  osteopenia Encouraged her to take bisphosphonate therapy and sent a prescription for Fosamax  once a week.  She understands risks and benefits of Fosamax  including the fact that she has to drink with a large amount of water  and not lie down after taking it as well as the risk of osteonecrosis of the jaw.   Arthritis: Patient did well with left knee replacement and is planning to do a right knee replacement soon.   Return to clinic in 1 year for follow-up ------------------------------------- Assessment and Plan Assessment & Plan Osteopenia Bone density decreased from T-score -1.2 to -2.1, likely due to anastrozole . - Prescribe Fosamax  (alendronate ) once weekly. Take on empty stomach with water , remain upright for 30 minutes. - Continue calcium and vitamin D supplementation. - Avoid dental extractions for one month post-Fosamax .  Asthma Asthma exacerbation likely due to smoke exposure, treated by another physician with improvement.  General Health Maintenance Active lifestyle  with regular exercise beneficial for arthritis and overall health. - Encourage continued exercise and activity.      No orders of the defined types were placed in this encounter.  The patient has a good understanding of the overall plan. she agrees with it. she will call with any problems that may develop before the next visit here. Total time spent: 30 mins including face to face time and time spent for planning, charting and co-ordination of care   Viinay K Caresse Sedivy, MD 01/12/24

## 2024-01-20 ENCOUNTER — Other Ambulatory Visit (HOSPITAL_COMMUNITY): Payer: Self-pay | Admitting: Family Medicine

## 2024-01-20 ENCOUNTER — Ambulatory Visit (HOSPITAL_COMMUNITY)
Admission: RE | Admit: 2024-01-20 | Discharge: 2024-01-20 | Disposition: A | Discharge status: Home / Self Care | Source: Ambulatory Visit | Attending: Family Medicine | Admitting: Family Medicine

## 2024-01-20 DIAGNOSIS — Z1231 Encounter for screening mammogram for malignant neoplasm of breast: Secondary | ICD-10-CM

## 2024-02-18 ENCOUNTER — Encounter: Payer: Self-pay | Admitting: Cardiovascular Disease

## 2024-02-18 ENCOUNTER — Ambulatory Visit: Attending: Cardiovascular Disease | Admitting: Cardiovascular Disease

## 2024-02-18 VITALS — BP 150/88 | HR 72 | Ht 64.0 in | Wt 191.0 lb

## 2024-02-18 DIAGNOSIS — Z853 Personal history of malignant neoplasm of breast: Secondary | ICD-10-CM | POA: Diagnosis not present

## 2024-02-18 DIAGNOSIS — I10 Essential (primary) hypertension: Secondary | ICD-10-CM

## 2024-02-18 DIAGNOSIS — E119 Type 2 diabetes mellitus without complications: Secondary | ICD-10-CM

## 2024-02-18 DIAGNOSIS — E7800 Pure hypercholesterolemia, unspecified: Secondary | ICD-10-CM

## 2024-02-18 DIAGNOSIS — Z923 Personal history of irradiation: Secondary | ICD-10-CM | POA: Diagnosis not present

## 2024-02-18 DIAGNOSIS — R002 Palpitations: Secondary | ICD-10-CM

## 2024-02-18 DIAGNOSIS — Z01818 Encounter for other preprocedural examination: Secondary | ICD-10-CM | POA: Diagnosis not present

## 2024-02-18 DIAGNOSIS — Z9012 Acquired absence of left breast and nipple: Secondary | ICD-10-CM | POA: Diagnosis not present

## 2024-02-18 DIAGNOSIS — E669 Obesity, unspecified: Secondary | ICD-10-CM

## 2024-02-18 NOTE — Patient Instructions (Signed)

## 2024-02-18 NOTE — Progress Notes (Signed)
 Cardiology Office Note    Date:  02/18/2024   ID:  Jersie, Beel 1961-04-17, MRN [id number]  PCP:  Marvine Rush, MD  Cardiologist:   Jerel Balding, MD   Chief Complaint  Patient presents with   Pre-op Exam    History of Present Illness:  Tracy Knight is a 63 y.o. female with obesity, hyperlipidemia, type 2 diabetes mellitus, hypertension and palpitations here for preoperative evaluation before undergoing right total knee replacement.  She had uncomplicated left total knee replacement about 7 months ago.  She has not had any cardiovascular problems since then.The patient specifically denies any chest pain at rest or with exertion, dyspnea at rest or with exertion, orthopnea, paroxysmal nocturnal dyspnea, syncope, palpitations, focal neurological deficits, intermittent claudication, lower extremity edema, unexplained weight gain, cough, hemoptysis or wheezing.  ECG today is normal.  Blood pressure control has been good.  Her blood pressure slightly elevated today since she is in a hurry to get to another doctor's appointment, but usually less than 140/80.  She has occasional mild ankle swelling towards the end of the day that resolves by the next morning, attributable to her calcium channel blocker.  She has good glycemic control, most recently hemoglobin A1c was 6.1%.  She has a pretty good lipid profile with HDL 65 and LDL 76.  She does not have known CAD or PAD.  It has been almost 5 years.  She completed surgery and radiation therapy for right breast cancer.  She continues to take Arimidex .  She had a remote cardiac catheterization in 2005 with normal coronary arteries. Her echo shows normal left ventricular ejection fraction, borderline left atrial enlargement and borderline criteria for mitral valve prolapse, mild mitral insufficiency. Past treatment with carvedilol caused problems with wheezing and asthma    Past Medical History:  Diagnosis Date   Arthritis    Asthma     Cancer (HCC) 11/2018   left breast cancer   Diabetes mellitus without complication (HCC)    Type 2   Dyslipidemia    Dysrhythmia    Palpitations   History of nuclear stress test 02/2009   exercise; normal pattern of perfusion; no significant ischemia demonstrated; low risk scan    Hypertension     Past Surgical History:  Procedure Laterality Date   BREAST RECONSTRUCTION  04/16/2020    Removal left chest tissue expander and placement silicone implant 2. Latissimus dorsi flap to left chest 3. Right breast reduction   BREAST RECONSTRUCTION WITH PLACEMENT OF TISSUE EXPANDER AND ALLODERM Left 02/18/2019   Procedure: LEFT BREAST RECONSTRUCTION WITH PLACEMENT OF TISSUE EXPANDER AND ALLODERM;  Surgeon: Arelia Filippo, MD;  Location: [facility];  Service: Plastics;  Laterality: Left;   BREAST REDUCTION SURGERY Right 04/16/2020   Procedure: RIGHT BREAST REDUCTION;  Surgeon: Arelia Filippo, MD;  Location: [facility] OR;  Service: Plastics;  Laterality: Right;   CARDIAC CATHETERIZATION  07/2003   no evidence of CAD   COLONOSCOPY N/A 02/17/2017   Procedure: COLONOSCOPY;  Surgeon: Shaaron Lamar CHRISTELLA, MD;  Location: [facility];  Service: Endoscopy;  Laterality: N/A;  10:00am   COLONOSCOPY WITH PROPOFOL  N/A 06/02/2022   Procedure: COLONOSCOPY WITH PROPOFOL ;  Surgeon: Shaaron Lamar CHRISTELLA, MD;  Location: [facility];  Service: Endoscopy;  Laterality: N/A;  8:15am, asa 2   LATISSIMUS FLAP TO BREAST Left 04/16/2020   Procedure: LEFT LATISSIMUS DORSI FLAP TO LEFT CHEST;  Surgeon: Arelia Filippo, MD;  Location: [facility];  Service: Plastics;  Laterality: Left;   LUMBAR LAMINECTOMY/DECOMPRESSION MICRODISCECTOMY N/A 02/26/2023   Procedure: LUMBAR TWO TO THREE DECOMPRESSION AND DISCECTOMY;  Surgeon: Burnetta Aures, MD;  [facility];  Service: Orthopedics;  Laterality: N/A;  3 C-Bed   MASTECTOMY W/ SENTINEL NODE BIOPSY Left 02/18/2019   Procedure: LEFT MASTECTOMY WITH LEFT AXILLARY SENTINEL LYMPH NODE  BIOPSY;  Surgeon: Ethyl Lenis, MD;  Location: [facility];  Service: General;  Laterality: Left;   POLYPECTOMY  02/17/2017   Procedure: POLYPECTOMY;  Surgeon: Shaaron Lamar HERO, MD;  Location: [facility];  Service: Endoscopy;;  colon   removal cyst on chin     REMOVAL OF TISSUE EXPANDER AND PLACEMENT OF IMPLANT Left 04/16/2020   Procedure: REMOVAL OF LEFT CHEST TISSUE EXPANDER AND PLACEMENT OF IMPLANT;  Surgeon: Arelia Filippo, MD;  Location: [facility];  Service: Plastics;  Laterality: Left;   TRANSTHORACIC ECHOCARDIOGRAM  03/2007   EF=>55%; borderline LA enlargement; MV mildly thickened/borderline MVP/mild MR    Current Medications: Outpatient Medications Prior to Visit  Medication Sig Dispense Refill   alendronate  (FOSAMAX ) 70 MG tablet Take 1 tablet (70 mg total) by mouth once a week. Take with a full glass of water  on an empty stomach. 12 tablet 3   amoxicillin-clavulanate (AUGMENTIN) 875-125 MG tablet Take 1 tablet by mouth 2 (two) times daily.     anastrozole  (ARIMIDEX ) 1 MG tablet TAKE 1 TABLET(1 MG) BY MOUTH DAILY 90 tablet 3   benzonatate (TESSALON) 200 MG capsule Take by mouth.     CALCIUM-VITAMIN D PO Take 1 tablet by mouth in the morning.     cetirizine (ZYRTEC) 10 MG tablet Take 10 mg by mouth in the morning.     Cyanocobalamin  (VITAMIN B-12) 2500 MCG SUBL Place 2,500 mcg under the tongue in the morning.     diltiazem  (TIAZAC ) 360 MG 24 hr capsule Take 360 mg by mouth daily.     hydrochlorothiazide  (HYDRODIURIL ) 25 MG tablet Take 1 tablet (25 mg total) by mouth daily. 90 tablet 3   meloxicam (MOBIC) 15 MG tablet Take 15 mg by mouth daily.     montelukast  (SINGULAIR ) 10 MG tablet Take 10 mg by mouth daily before lunch.      olmesartan  (BENICAR ) 40 MG tablet Take 1 tablet (40 mg total) by mouth daily. 90 tablet 3   pravastatin  (PRAVACHOL ) 40 MG tablet TAKE 1 TABLET(40 MG) BY MOUTH DAILY 90 tablet 3   PROAIR  HFA 108 (90 Base) MCG/ACT inhaler Inhale 2 puffs into the  lungs every 4 (four) hours as needed for wheezing or shortness of breath.   2   sitaGLIPtin -metformin  (JANUMET ) 50-500 MG tablet Take 1 tablet by mouth 2 (two) times daily with a meal.     STUDY - ARCADIA - aspirin 81mg  or placebo (PI - Sethi)      triamterene  (DYRENIUM ) 50 MG capsule TAKE 1 CAPSULE(50 MG) BY MOUTH DAILY 90 capsule 3   chlorpheniramine-HYDROcodone (TUSSIONEX) 10-8 MG/5ML Take 5 mLs by mouth 2 (two) times daily as needed. (Patient not taking: Reported on 02/18/2024)     diltiazem  (TIAZAC ) 180 MG 24 hr capsule Take 2 capsules (360 mg total) by mouth daily. 180 capsule 3   No facility-administered medications prior to visit.     Allergies:   Carvedilol   Family History:  The patient's family history includes Diabetes in her daughter; Heart disease in her father and mother; Hypertension in her daughter and sister.   ROS:   Please see the history of  present illness.    ROS All other systems are reviewed and are negative.   PHYSICAL EXAM:   VS:  BP (!) 150/88 (BP Location: Left Arm, Patient Position: Sitting)   Pulse 72   Ht 5' 4 (1.626 m)   Wt 191 lb (86.6 kg)   SpO2 98%   BMI 32.79 kg/m      General: Alert, oriented x3, no distress, mildly obese Head: no evidence of trauma, PERRL, EOMI, no exophtalmos or lid lag, no myxedema, no xanthelasma; normal ears, nose and oropharynx Neck: normal jugular venous pulsations and no hepatojugular reflux; brisk carotid pulses without delay and no carotid bruits Chest: clear to auscultation, no signs of consolidation by percussion or palpation, normal fremitus, symmetrical and full respiratory excursions Cardiovascular: normal position and quality of the apical impulse, regular rhythm, normal first and second heart sounds, no murmurs, rubs or gallops Abdomen: no tenderness or distention, no masses by palpation, no abnormal pulsatility or arterial bruits, normal bowel sounds, no hepatosplenomegaly Extremities: no clubbing, cyanosis or  edema; 2+ radial, ulnar and brachial pulses bilaterally; 2+ right femoral, posterior tibial and dorsalis pedis pulses; 2+ left femoral, posterior tibial and dorsalis pedis pulses; no subclavian or femoral bruits Neurological: grossly nonfocal Psych: Normal mood and affect   Wt Readings from Last 3 Encounters:  02/18/24 191 lb (86.6 kg)  01/12/24 192 lb 14.4 oz (87.5 kg)  11/19/23 195 lb (88.5 kg)     Studies Reviewed: SABRA        ECG 02/16/2023 personally reviewed, shows normal sinus rhythm, normal tracing Risk Assessment/Calculations:      Recent Labs:     Latest Ref Rng & Units 02/16/2023   11:53 AM 05/26/2022   12:20 PM 04/12/2020   11:00 AM  BMP  Glucose 70 - 99 mg/dL 859  861  851   BUN 8 - 23 mg/dL 20  21  18    Creatinine 0.44 - 1.00 mg/dL 9.06  9.13  9.13   Sodium 135 - 145 mmol/L 140  139  140   Potassium 3.5 - 5.1 mmol/L 3.4  3.4  3.4   Chloride 98 - 111 mmol/L 100  100  100   CO2 22 - 32 mmol/L 27  29  27    Calcium 8.9 - 10.3 mg/dL 9.8  89.8  89.3      98/96/7977 Hemoglobin A1c 6.1% Total cholesterol 170, HDL 74, triglycerides 85  89/95/7978 Hemoglobin 11.9, creatinine 0.86, normal liver function tests  11/19/2022 cholesterol 164, HDL 71, LDL 76, triglycerides 94  1/80/7975 hemoglobin A1c 6.5%, creatinine 0.93, potassium 3.4  Lipid Panel    Component Value Date/Time   CHOL 167 04/06/2013 1049   TRIG 52 04/06/2013 1049   HDL 72 04/06/2013 1049   CHOLHDL 2.3 04/06/2013 1049   VLDL 10 04/06/2013 1049   LDLCALC 85 04/06/2013 1049     ASSESSMENT:    1. Pre-op evaluation   2. Palpitations   3. Essential hypertension   4. Non-insulin  treated type 2 diabetes mellitus (HCC)   5. Mild obesity   6. Hypercholesterolemia       PLAN:  In order of problems listed above:  Palpitations: Completely suppressed with diltiazem .  Had wheezing with carvedilol. HTN: Well-controlled at her last well woman visit with her OB/GYN in May at 138/78.  Slightly  higher today, but she is worried that she is going to miss her next office appointment.  No changes made to her medication.  Has a tendency to have  borderline hypokalemia probably due to her thiazide diuretic. DM: Good control Obesity: Harder to exercise now, makes it even more important to double down on a careful diet. HLP: Excellent lipid profile.  Continue pravastatin . Preop CV exam: Low risk for major cardiovascular complication with planned knee surgery.  Note sent to Dr. Lyndle office   Medication Adjustments/Labs and Tests Ordered: Current medicines are reviewed at length with the patient today.  Concerns regarding medicines are outlined above.  Medication changes, Labs and Tests ordered today are listed in the Patient Instructions below. Patient Instructions  Medication Instructions:  No changes *If you need a refill on your cardiac medications before your next appointment, please call your pharmacy*  Lab Work: None ordered If you have labs (blood work) drawn today and your tests are completely normal, you will receive your results only by: MyChart Message (if you have MyChart) OR A paper copy in the mail If you have any lab test that is abnormal or we need to change your treatment, we will call you to review the results.  Testing/Procedures: None ordered  Follow-Up: At [facility], you and your health needs are our priority.  As part of our continuing mission to provide you with exceptional heart care, our providers are all part of one team.  This team includes your primary Cardiologist (physician) and Advanced Practice Providers or APPs (Physician Assistants and Nurse Practitioners) who all work together to provide you with the care you need, when you need it.  Your next appointment:   1 year(s)  Provider:   Jerel Balding, MD    We recommend signing up for the patient portal called MyChart.  Sign up information is provided on this After Visit Summary.  MyChart  is used to connect with patients for Virtual Visits (Telemedicine).  Patients are able to view lab/test results, encounter notes, upcoming appointments, etc.  Non-urgent messages can be sent to your provider as well.   To learn more about what you can do with MyChart, go to [email or web address].

## 2024-02-25 DIAGNOSIS — Z79899 Other long term (current) drug therapy: Secondary | ICD-10-CM | POA: Diagnosis not present

## 2024-03-14 DIAGNOSIS — G8918 Other acute postprocedural pain: Secondary | ICD-10-CM | POA: Diagnosis not present

## 2024-03-14 DIAGNOSIS — M1711 Unilateral primary osteoarthritis, right knee: Secondary | ICD-10-CM | POA: Diagnosis not present

## 2024-03-16 DIAGNOSIS — M25561 Pain in right knee: Secondary | ICD-10-CM | POA: Diagnosis not present

## 2024-03-16 DIAGNOSIS — M25661 Stiffness of right knee, not elsewhere classified: Secondary | ICD-10-CM | POA: Diagnosis not present

## 2024-03-18 DIAGNOSIS — M25661 Stiffness of right knee, not elsewhere classified: Secondary | ICD-10-CM | POA: Diagnosis not present

## 2024-03-18 DIAGNOSIS — M25561 Pain in right knee: Secondary | ICD-10-CM | POA: Diagnosis not present

## 2024-03-21 DIAGNOSIS — M25661 Stiffness of right knee, not elsewhere classified: Secondary | ICD-10-CM | POA: Diagnosis not present

## 2024-03-21 DIAGNOSIS — M25561 Pain in right knee: Secondary | ICD-10-CM | POA: Diagnosis not present

## 2024-03-23 DIAGNOSIS — M25661 Stiffness of right knee, not elsewhere classified: Secondary | ICD-10-CM | POA: Diagnosis not present

## 2024-03-23 DIAGNOSIS — M25561 Pain in right knee: Secondary | ICD-10-CM | POA: Diagnosis not present

## 2024-03-25 DIAGNOSIS — M25661 Stiffness of right knee, not elsewhere classified: Secondary | ICD-10-CM | POA: Diagnosis not present

## 2024-03-25 DIAGNOSIS — M25561 Pain in right knee: Secondary | ICD-10-CM | POA: Diagnosis not present

## 2024-03-28 DIAGNOSIS — M25661 Stiffness of right knee, not elsewhere classified: Secondary | ICD-10-CM | POA: Diagnosis not present

## 2024-03-28 DIAGNOSIS — M25561 Pain in right knee: Secondary | ICD-10-CM | POA: Diagnosis not present

## 2024-04-01 DIAGNOSIS — M25661 Stiffness of right knee, not elsewhere classified: Secondary | ICD-10-CM | POA: Diagnosis not present

## 2024-04-01 DIAGNOSIS — M25561 Pain in right knee: Secondary | ICD-10-CM | POA: Diagnosis not present

## 2024-04-04 DIAGNOSIS — M25561 Pain in right knee: Secondary | ICD-10-CM | POA: Diagnosis not present

## 2024-04-04 DIAGNOSIS — M25661 Stiffness of right knee, not elsewhere classified: Secondary | ICD-10-CM | POA: Diagnosis not present

## 2024-04-06 DIAGNOSIS — M25661 Stiffness of right knee, not elsewhere classified: Secondary | ICD-10-CM | POA: Diagnosis not present

## 2024-04-06 DIAGNOSIS — M25561 Pain in right knee: Secondary | ICD-10-CM | POA: Diagnosis not present

## 2024-04-11 DIAGNOSIS — M25561 Pain in right knee: Secondary | ICD-10-CM | POA: Diagnosis not present

## 2024-04-11 DIAGNOSIS — M25661 Stiffness of right knee, not elsewhere classified: Secondary | ICD-10-CM | POA: Diagnosis not present

## 2024-04-15 DIAGNOSIS — M25561 Pain in right knee: Secondary | ICD-10-CM | POA: Diagnosis not present

## 2024-04-15 DIAGNOSIS — M25661 Stiffness of right knee, not elsewhere classified: Secondary | ICD-10-CM | POA: Diagnosis not present

## 2024-04-20 DIAGNOSIS — M25661 Stiffness of right knee, not elsewhere classified: Secondary | ICD-10-CM | POA: Diagnosis not present

## 2024-04-20 DIAGNOSIS — M25561 Pain in right knee: Secondary | ICD-10-CM | POA: Diagnosis not present

## 2024-04-25 DIAGNOSIS — M25661 Stiffness of right knee, not elsewhere classified: Secondary | ICD-10-CM | POA: Diagnosis not present

## 2024-04-25 DIAGNOSIS — M25561 Pain in right knee: Secondary | ICD-10-CM | POA: Diagnosis not present

## 2024-04-27 DIAGNOSIS — M25661 Stiffness of right knee, not elsewhere classified: Secondary | ICD-10-CM | POA: Diagnosis not present

## 2024-04-27 DIAGNOSIS — M25561 Pain in right knee: Secondary | ICD-10-CM | POA: Diagnosis not present

## 2024-05-03 DIAGNOSIS — M25561 Pain in right knee: Secondary | ICD-10-CM | POA: Diagnosis not present

## 2024-05-03 DIAGNOSIS — M25661 Stiffness of right knee, not elsewhere classified: Secondary | ICD-10-CM | POA: Diagnosis not present

## 2024-05-05 DIAGNOSIS — Z96651 Presence of right artificial knee joint: Secondary | ICD-10-CM | POA: Diagnosis not present

## 2024-05-17 DIAGNOSIS — J4520 Mild intermittent asthma, uncomplicated: Secondary | ICD-10-CM | POA: Diagnosis not present

## 2024-05-17 DIAGNOSIS — E119 Type 2 diabetes mellitus without complications: Secondary | ICD-10-CM | POA: Diagnosis not present

## 2024-05-17 DIAGNOSIS — E785 Hyperlipidemia, unspecified: Secondary | ICD-10-CM | POA: Diagnosis not present

## 2024-05-17 DIAGNOSIS — Z23 Encounter for immunization: Secondary | ICD-10-CM | POA: Diagnosis not present

## 2024-05-17 DIAGNOSIS — I10 Essential (primary) hypertension: Secondary | ICD-10-CM | POA: Diagnosis not present

## 2024-06-01 DIAGNOSIS — H35461 Secondary vitreoretinal degeneration, right eye: Secondary | ICD-10-CM | POA: Diagnosis not present

## 2025-02-06 ENCOUNTER — Ambulatory Visit: Admitting: Hematology and Oncology
